# Patient Record
Sex: Male | Born: 1985 | Race: Black or African American | Hispanic: No | Marital: Single | Smoking: Former smoker
Health system: Southern US, Community
[De-identification: ages and names within clinical notes are randomized; demographics above are authoritative.]

## PROBLEM LIST (undated history)

## (undated) DIAGNOSIS — I10 Essential (primary) hypertension: Secondary | ICD-10-CM

## (undated) DIAGNOSIS — M109 Gout, unspecified: Secondary | ICD-10-CM

## (undated) DIAGNOSIS — I509 Heart failure, unspecified: Secondary | ICD-10-CM

## (undated) DIAGNOSIS — N179 Acute kidney failure, unspecified: Secondary | ICD-10-CM

## (undated) DIAGNOSIS — Z789 Other specified health status: Secondary | ICD-10-CM

---
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
## 2005-10-18 ENCOUNTER — Emergency Department (HOSPITAL_COMMUNITY): Admission: EM | Admit: 2005-10-18 | Discharge: 2005-10-18 | Payer: Self-pay | Admitting: Emergency Medicine

## 2006-07-11 ENCOUNTER — Emergency Department (HOSPITAL_COMMUNITY): Admission: EM | Admit: 2006-07-11 | Discharge: 2006-07-11 | Payer: Self-pay | Admitting: Emergency Medicine

## 2006-11-24 ENCOUNTER — Emergency Department (HOSPITAL_COMMUNITY): Admission: EM | Admit: 2006-11-24 | Discharge: 2006-11-25 | Payer: Self-pay | Admitting: Emergency Medicine

## 2007-05-20 ENCOUNTER — Emergency Department (HOSPITAL_COMMUNITY): Admission: EM | Admit: 2007-05-20 | Discharge: 2007-05-20 | Payer: Self-pay | Admitting: Family Medicine

## 2007-08-09 ENCOUNTER — Emergency Department (HOSPITAL_COMMUNITY): Admission: EM | Admit: 2007-08-09 | Discharge: 2007-08-10 | Payer: Self-pay | Admitting: Emergency Medicine

## 2010-02-17 ENCOUNTER — Emergency Department (HOSPITAL_COMMUNITY)
Admission: EM | Admit: 2010-02-17 | Discharge: 2010-02-17 | Disposition: A | Discharge status: Home / Self Care | Payer: Self-pay | Attending: Emergency Medicine | Admitting: Emergency Medicine

## 2010-02-17 DIAGNOSIS — K029 Dental caries, unspecified: Secondary | ICD-10-CM | POA: Insufficient documentation

## 2010-02-17 DIAGNOSIS — K047 Periapical abscess without sinus: Secondary | ICD-10-CM | POA: Insufficient documentation

## 2010-02-17 DIAGNOSIS — K089 Disorder of teeth and supporting structures, unspecified: Secondary | ICD-10-CM | POA: Insufficient documentation

## 2010-10-19 ENCOUNTER — Emergency Department (HOSPITAL_COMMUNITY)
Admission: EM | Admit: 2010-10-19 | Discharge: 2010-10-20 | Disposition: A | Discharge status: Home / Self Care | Payer: Self-pay | Attending: Emergency Medicine | Admitting: Emergency Medicine

## 2010-10-19 ENCOUNTER — Emergency Department (HOSPITAL_COMMUNITY): Payer: Self-pay

## 2010-10-19 DIAGNOSIS — R05 Cough: Secondary | ICD-10-CM | POA: Insufficient documentation

## 2010-10-19 DIAGNOSIS — R079 Chest pain, unspecified: Secondary | ICD-10-CM | POA: Insufficient documentation

## 2010-10-19 DIAGNOSIS — R059 Cough, unspecified: Secondary | ICD-10-CM | POA: Insufficient documentation

## 2010-10-19 DIAGNOSIS — R509 Fever, unspecified: Secondary | ICD-10-CM | POA: Insufficient documentation

## 2010-10-19 DIAGNOSIS — R0602 Shortness of breath: Secondary | ICD-10-CM | POA: Insufficient documentation

## 2010-10-19 DIAGNOSIS — J069 Acute upper respiratory infection, unspecified: Secondary | ICD-10-CM | POA: Insufficient documentation

## 2012-03-25 ENCOUNTER — Emergency Department (HOSPITAL_COMMUNITY)
Admission: EM | Admit: 2012-03-25 | Discharge: 2012-03-25 | Discharge status: Against Medical Advice | Payer: Self-pay | Attending: Emergency Medicine | Admitting: Emergency Medicine

## 2012-03-25 ENCOUNTER — Encounter (HOSPITAL_COMMUNITY): Payer: Self-pay | Admitting: *Deleted

## 2012-03-25 ENCOUNTER — Emergency Department (HOSPITAL_COMMUNITY): Payer: Self-pay

## 2012-03-25 DIAGNOSIS — F17200 Nicotine dependence, unspecified, uncomplicated: Secondary | ICD-10-CM | POA: Insufficient documentation

## 2012-03-25 DIAGNOSIS — R05 Cough: Secondary | ICD-10-CM | POA: Insufficient documentation

## 2012-03-25 DIAGNOSIS — R059 Cough, unspecified: Secondary | ICD-10-CM | POA: Insufficient documentation

## 2012-03-25 DIAGNOSIS — R509 Fever, unspecified: Secondary | ICD-10-CM | POA: Insufficient documentation

## 2012-03-25 MED ORDER — ACETAMINOPHEN 325 MG PO TABS
650.0000 mg | ORAL_TABLET | Freq: Once | ORAL | Status: AC
  Administered 2012-03-25: 650 mg via ORAL
  Filled 2012-03-25: qty 2

## 2012-03-25 NOTE — ED Notes (Signed)
Pt c/o fever, cough, chills x 2 days

## 2012-03-25 NOTE — ED Notes (Signed)
Pt not in waiting area x 3 

## 2012-04-09 ENCOUNTER — Emergency Department (HOSPITAL_COMMUNITY): Payer: Self-pay

## 2012-04-09 ENCOUNTER — Observation Stay (HOSPITAL_COMMUNITY)
Admission: EM | Admit: 2012-04-09 | Discharge: 2012-04-11 | Payer: Self-pay | Attending: General Surgery | Admitting: General Surgery

## 2012-04-09 DIAGNOSIS — Z1811 Retained magnetic metal fragments: Secondary | ICD-10-CM | POA: Insufficient documentation

## 2012-04-09 DIAGNOSIS — S31809A Unspecified open wound of unspecified buttock, initial encounter: Principal | ICD-10-CM | POA: Insufficient documentation

## 2012-04-09 DIAGNOSIS — S31813A Puncture wound without foreign body of right buttock, initial encounter: Secondary | ICD-10-CM

## 2012-04-09 DIAGNOSIS — D62 Acute posthemorrhagic anemia: Secondary | ICD-10-CM | POA: Insufficient documentation

## 2012-04-09 HISTORY — DX: Other specified health status: Z78.9

## 2012-04-09 LAB — URINALYSIS, ROUTINE W REFLEX MICROSCOPIC
Bilirubin Urine: NEGATIVE
Hgb urine dipstick: NEGATIVE
Specific Gravity, Urine: 1.036 — ABNORMAL HIGH (ref 1.005–1.030)
pH: 6 (ref 5.0–8.0)

## 2012-04-09 LAB — CBC WITH DIFFERENTIAL/PLATELET
Eosinophils Absolute: 0.1 10*3/uL (ref 0.0–0.7)
HCT: 39.7 % (ref 39.0–52.0)
Hemoglobin: 13.9 g/dL (ref 13.0–17.0)
Lymphs Abs: 3.2 10*3/uL (ref 0.7–4.0)
MCH: 29.4 pg (ref 26.0–34.0)
Monocytes Relative: 4 % (ref 3–12)
Neutrophils Relative %: 71 % (ref 43–77)
RBC: 4.72 MIL/uL (ref 4.22–5.81)

## 2012-04-09 LAB — POCT I-STAT, CHEM 8
Glucose, Bld: 123 mg/dL — ABNORMAL HIGH (ref 70–99)
HCT: 45 % (ref 39.0–52.0)
Hemoglobin: 15.3 g/dL (ref 13.0–17.0)
Potassium: 3.3 mEq/L — ABNORMAL LOW (ref 3.5–5.1)
Sodium: 142 mEq/L (ref 135–145)
TCO2: 21 mmol/L (ref 0–100)

## 2012-04-09 LAB — BASIC METABOLIC PANEL
BUN: 9 mg/dL (ref 6–23)
Chloride: 102 mEq/L (ref 96–112)
GFR calc non Af Amer: 70 mL/min — ABNORMAL LOW (ref >90)
Glucose, Bld: 123 mg/dL — ABNORMAL HIGH (ref 70–99)
Potassium: 3.3 mEq/L — ABNORMAL LOW (ref 3.5–5.1)

## 2012-04-09 LAB — URINE MICROSCOPIC-ADD ON

## 2012-04-09 MED ORDER — HYDROMORPHONE HCL PF 1 MG/ML IJ SOLN
1.0000 mg | INTRAMUSCULAR | Status: DC | PRN
  Administered 2012-04-09 – 2012-04-11 (×6): 1 mg via INTRAVENOUS
  Filled 2012-04-09 (×7): qty 1

## 2012-04-09 MED ORDER — OXYCODONE HCL 5 MG PO TABS: 5.0000 mg | ORAL_TABLET | ORAL | Status: DC | PRN

## 2012-04-09 MED ORDER — HYDROMORPHONE HCL PF 1 MG/ML IJ SOLN
INTRAMUSCULAR | Status: AC
  Administered 2012-04-09: 1 mg via INTRAVENOUS
  Filled 2012-04-09: qty 1

## 2012-04-09 MED ORDER — ONDANSETRON HCL 4 MG PO TABS: 4.0000 mg | ORAL_TABLET | Freq: Four times a day (QID) | ORAL | Status: DC | PRN

## 2012-04-09 MED ORDER — POTASSIUM CHLORIDE IN NACL 20-0.9 MEQ/L-% IV SOLN
INTRAVENOUS | Status: DC
  Administered 2012-04-09 – 2012-04-10 (×4): via INTRAVENOUS
  Filled 2012-04-09 (×4): qty 1000

## 2012-04-09 MED ORDER — HYDROMORPHONE HCL PF 1 MG/ML IJ SOLN
1.0000 mg | Freq: Once | INTRAMUSCULAR | Status: DC
  Administered 2012-04-09: 1 mg via INTRAVENOUS

## 2012-04-09 MED ORDER — TETANUS-DIPHTH-ACELL PERTUSSIS 5-2.5-18.5 LF-MCG/0.5 IM SUSP
INTRAMUSCULAR | Status: AC
  Filled 2012-04-09: qty 0.5

## 2012-04-09 MED ORDER — TETANUS-DIPHTH-ACELL PERTUSSIS 5-2.5-18.5 LF-MCG/0.5 IM SUSP
0.5000 mL | Freq: Once | INTRAMUSCULAR | Status: DC
  Administered 2012-04-09: 0.5 mL via INTRAMUSCULAR

## 2012-04-09 MED ORDER — CEFAZOLIN SODIUM-DEXTROSE 2-3 GM-% IV SOLR
2.0000 g | Freq: Three times a day (TID) | INTRAVENOUS | Status: DC
  Administered 2012-04-09 – 2012-04-11 (×6): 2 g via INTRAVENOUS
  Filled 2012-04-09 (×8): qty 50

## 2012-04-09 MED ORDER — OXYCODONE HCL 5 MG PO TABS: 2.5000 mg | ORAL_TABLET | ORAL | Status: DC | PRN

## 2012-04-09 MED ORDER — OXYCODONE-ACETAMINOPHEN 5-325 MG PO TABS: 2.0000 | ORAL_TABLET | ORAL | Status: DC | PRN

## 2012-04-09 MED ORDER — IOHEXOL 300 MG/ML  SOLN
100.0000 mL | Freq: Once | INTRAMUSCULAR | Status: DC | PRN
  Administered 2012-04-09: 100 mL via INTRAVENOUS

## 2012-04-09 MED ORDER — ONDANSETRON HCL 4 MG/2ML IJ SOLN: 4.0000 mg | Freq: Four times a day (QID) | INTRAMUSCULAR | Status: DC | PRN

## 2012-04-09 MED ORDER — OXYCODONE HCL 5 MG PO TABS: 10.0000 mg | ORAL_TABLET | ORAL | Status: DC | PRN
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
## 2012-04-09 NOTE — ED Notes (Signed)
No chnages, VSS, pt to CT. Alert, NAD, calm, interactive, lying on L side.

## 2012-04-09 NOTE — ED Notes (Addendum)
To CT, no changes. blooody gauze saturated. new folded abd pad applied to buttocks with hypafix tape.
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
## 2012-04-09 NOTE — ED Notes (Signed)
Pt talking on phone

## 2012-04-09 NOTE — ED Notes (Signed)
Dr. Norlene Campbell at [facility], updating pt re: results.
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
## 2012-04-09 NOTE — ED Notes (Signed)
H&H noted. VSS, pt talking on cell phone. Pending lab results.

## 2012-04-10 LAB — BASIC METABOLIC PANEL
BUN: 6 mg/dL (ref 6–23)
Calcium: 8.6 mg/dL (ref 8.4–10.5)
GFR calc Af Amer: >90 mL/min (ref >90)
GFR calc non Af Amer: 87 mL/min — ABNORMAL LOW (ref >90)
Glucose, Bld: 94 mg/dL (ref 70–99)
Potassium: 3.8 mEq/L (ref 3.5–5.1)

## 2012-04-10 LAB — CBC
HCT: 34.2 % — ABNORMAL LOW (ref 39.0–52.0)
MCH: 29.5 pg (ref 26.0–34.0)
MCHC: 35.1 g/dL (ref 30.0–36.0)
RDW: 12.8 % (ref 11.5–15.5)
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
## 2012-04-10 NOTE — Progress Notes (Signed)
Utilization review completed.  
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
## 2012-04-11 DIAGNOSIS — W3400XA Accidental discharge from unspecified firearms or gun, initial encounter: Secondary | ICD-10-CM

## 2012-04-11 DIAGNOSIS — S31803A Puncture wound without foreign body of unspecified buttock, initial encounter: Secondary | ICD-10-CM

## 2012-04-11 DIAGNOSIS — D62 Acute posthemorrhagic anemia: Secondary | ICD-10-CM | POA: Diagnosis not present

## 2012-04-11 MED ORDER — OXYCODONE-ACETAMINOPHEN 5-325 MG PO TABS: 2.0000 | ORAL_TABLET | ORAL | Status: DC | PRN

## 2012-04-11 MED ORDER — DIPHENHYDRAMINE HCL 25 MG PO CAPS
25.0000 mg | ORAL_CAPSULE | Freq: Once | ORAL | Status: DC
  Administered 2012-04-11: 25 mg via ORAL
  Filled 2012-04-11: qty 1
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
## 2012-04-11 NOTE — Discharge Summary (Signed)
Raheel Kunkle, MD, MPH, FACS Pager: [phone]  

## 2012-04-11 NOTE — Progress Notes (Signed)
Patient examined and I agree with the assessment and plan  Violeta Gelinas, MD, MPH, FACS Pager: [phone]  04/11/2012 4:31 PM

## 2012-04-11 NOTE — Progress Notes (Signed)
Before d/c, Robert Daniels c/o itching and rash on lower legs and R hip. Robert Daniels has never taken dilaudid before. No respiratory involvement is apparent  MD notified. Dr Elza Rafter ordered benedryl and advised to d/c patient once benedryl is given.
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
## 2019-04-18 ENCOUNTER — Other Ambulatory Visit: Payer: Self-pay

## 2019-04-18 ENCOUNTER — Encounter (HOSPITAL_BASED_OUTPATIENT_CLINIC_OR_DEPARTMENT_OTHER): Payer: Self-pay | Admitting: *Deleted

## 2019-04-18 ENCOUNTER — Emergency Department (HOSPITAL_BASED_OUTPATIENT_CLINIC_OR_DEPARTMENT_OTHER)
Admission: EM | Admit: 2019-04-18 | Discharge: 2019-04-18 | Disposition: A | Discharge status: Home / Self Care | Payer: Self-pay | Attending: Emergency Medicine | Admitting: Emergency Medicine

## 2019-04-18 DIAGNOSIS — Z20822 Contact with and (suspected) exposure to covid-19: Secondary | ICD-10-CM | POA: Insufficient documentation

## 2019-04-18 DIAGNOSIS — J029 Acute pharyngitis, unspecified: Secondary | ICD-10-CM | POA: Insufficient documentation

## 2019-04-18 DIAGNOSIS — F17203 Nicotine dependence unspecified, with withdrawal: Secondary | ICD-10-CM | POA: Insufficient documentation

## 2019-04-18 LAB — GROUP A STREP BY PCR: Group A Strep by PCR: NOT DETECTED

## 2019-04-18 LAB — MONONUCLEOSIS SCREEN: Mono Screen: NEGATIVE

## 2019-04-18 MED ORDER — IBUPROFEN 800 MG PO TABS
800.0000 mg | ORAL_TABLET | Freq: Once | ORAL | Status: AC
  Administered 2019-04-18: 21:00:00 800 mg via ORAL
  Filled 2019-04-18: qty 1

## 2019-04-18 MED ORDER — ACETAMINOPHEN 500 MG PO TABS
1000.0000 mg | ORAL_TABLET | Freq: Once | ORAL | Status: AC
  Administered 2019-04-18: 1000 mg via ORAL
  Filled 2019-04-18: qty 2

## 2019-04-18 MED ORDER — DEXAMETHASONE SODIUM PHOSPHATE 10 MG/ML IJ SOLN
10.0000 mg | Freq: Once | INTRAMUSCULAR | Status: AC
  Administered 2019-04-18: 21:00:00 10 mg via INTRAMUSCULAR
  Filled 2019-04-18: qty 1

## 2019-04-18 MED ORDER — AMOXICILLIN 500 MG PO CAPS: 500.0000 mg | ORAL_CAPSULE | Freq: Two times a day (BID) | ORAL | 0 refills | Status: AC

## 2019-04-18 NOTE — ED Triage Notes (Signed)
Chills, sore throat, fatigue, sob, headache, body aches.

## 2019-04-18 NOTE — Discharge Instructions (Signed)
You were seen in the emergency department today with sore throat and fever.  I am starting you on antibiotic but your strep test was negative.  I am testing you for COVID-19 and you can follow the test results in the MyChart app.  If your Covid test comes back positive you can stop the antibiotic.  If you develop worsening sore throat, inability to swallow or difficulty breathing you should return to emergency department immediately.  Please take the entire course of antibiotic.  I have also given you a steroid which will help with swelling and discomfort.
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
## 2019-04-19 LAB — SARS CORONAVIRUS 2 (TAT 6-24 HRS): SARS Coronavirus 2: NEGATIVE

## 2020-03-17 ENCOUNTER — Emergency Department (HOSPITAL_BASED_OUTPATIENT_CLINIC_OR_DEPARTMENT_OTHER)
Admission: EM | Admit: 2020-03-17 | Discharge: 2020-03-17 | Disposition: A | Discharge status: Home / Self Care | Payer: Self-pay | Attending: Emergency Medicine | Admitting: Emergency Medicine

## 2020-03-17 ENCOUNTER — Encounter (HOSPITAL_BASED_OUTPATIENT_CLINIC_OR_DEPARTMENT_OTHER): Payer: Self-pay

## 2020-03-17 ENCOUNTER — Other Ambulatory Visit: Payer: Self-pay

## 2020-03-17 ENCOUNTER — Emergency Department (HOSPITAL_BASED_OUTPATIENT_CLINIC_OR_DEPARTMENT_OTHER): Payer: Self-pay

## 2020-03-17 DIAGNOSIS — R0781 Pleurodynia: Secondary | ICD-10-CM | POA: Insufficient documentation

## 2020-03-17 DIAGNOSIS — R0789 Other chest pain: Secondary | ICD-10-CM

## 2020-03-17 DIAGNOSIS — F17290 Nicotine dependence, other tobacco product, uncomplicated: Secondary | ICD-10-CM | POA: Insufficient documentation

## 2020-03-17 DIAGNOSIS — R059 Cough, unspecified: Secondary | ICD-10-CM | POA: Insufficient documentation

## 2020-03-17 MED ORDER — IBUPROFEN 600 MG PO TABS: 600.0000 mg | ORAL_TABLET | Freq: Four times a day (QID) | ORAL | 0 refills | Status: DC | PRN

## 2020-03-17 MED ORDER — CYCLOBENZAPRINE HCL 10 MG PO TABS: 10.0000 mg | ORAL_TABLET | Freq: Three times a day (TID) | ORAL | 0 refills | Status: DC | PRN

## 2020-03-17 NOTE — ED Notes (Signed)
AVS provided to patient, pt teaching done in regards to the two Rx electronically sent to his pharmacy. Discussed safety while taking muscle relaxants. Opportunity for questions provided. Pt was able to ambulate to ck out without assistance.

## 2020-03-17 NOTE — ED Triage Notes (Signed)
Pt complains of pain under left ribs when coughing or moving a certain way. States pain is sharp in nature. Been occurring x1 week. States has been coughing and "had a cold" for the past week.

## 2020-03-17 NOTE — Discharge Instructions (Signed)
Take motrin for pain   Take flexeril for muscle spasms   See your doctor for follow up   Return to ER if you have worse chest wall pain, trouble breathing, cough, fever
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
## 2020-04-17 ENCOUNTER — Emergency Department (HOSPITAL_BASED_OUTPATIENT_CLINIC_OR_DEPARTMENT_OTHER)
Admission: EM | Admit: 2020-04-17 | Discharge: 2020-04-18 | Disposition: A | Discharge status: Home / Self Care | Payer: Self-pay | Attending: Emergency Medicine | Admitting: Emergency Medicine

## 2020-04-17 ENCOUNTER — Encounter (HOSPITAL_BASED_OUTPATIENT_CLINIC_OR_DEPARTMENT_OTHER): Payer: Self-pay | Admitting: *Deleted

## 2020-04-17 ENCOUNTER — Other Ambulatory Visit: Payer: Self-pay

## 2020-04-17 DIAGNOSIS — R000 Tachycardia, unspecified: Secondary | ICD-10-CM | POA: Insufficient documentation

## 2020-04-17 DIAGNOSIS — Z28310 Unvaccinated for covid-19: Secondary | ICD-10-CM | POA: Insufficient documentation

## 2020-04-17 DIAGNOSIS — Z20822 Contact with and (suspected) exposure to covid-19: Secondary | ICD-10-CM | POA: Insufficient documentation

## 2020-04-17 DIAGNOSIS — B349 Viral infection, unspecified: Secondary | ICD-10-CM | POA: Insufficient documentation

## 2020-04-17 DIAGNOSIS — F17290 Nicotine dependence, other tobacco product, uncomplicated: Secondary | ICD-10-CM | POA: Insufficient documentation

## 2020-04-17 MED ORDER — ACETAMINOPHEN 325 MG PO TABS
ORAL_TABLET | ORAL | Status: AC
  Filled 2020-04-17: qty 2

## 2020-04-17 MED ORDER — ACETAMINOPHEN 325 MG PO TABS
650.0000 mg | ORAL_TABLET | Freq: Once | ORAL | Status: AC
  Administered 2020-04-17: 650 mg via ORAL

## 2020-04-17 NOTE — ED Provider Notes (Signed)
[facility] EMERGENCY DEPARTMENT Provider Note  CSN: [id number] Arrival date & time: 04/17/20 2225    History Chief Complaint  Patient presents with  . Sore Throat  . Fever    HPI  Robert Daniels is a 35 y.o. male presents for evaluation of cough. He reports he has been sick for about a month and half with cough, worse at night, initially had some L rib pain which prompted him to come to the ED where a CXR was negative. He has continued to cough since then although the rib pain is resolved. He reports he has been coughing up green sputum. In the last few days he has started to have some sore throat and today he began running a fever. He has not had a Covid vaccine. No known sick contacts.    History reviewed. No pertinent past medical history.  History reviewed. No pertinent surgical history.  No family history on file.  Social History   Tobacco Use  . Smoking status: Current Every Day Smoker    Types: Cigars  . Smokeless tobacco: Never Used  Substance Use Topics  . Alcohol use: Yes  . Drug use: Never     Home Medications Prior to Admission medications   Medication Sig Start Date End Date Taking? Authorizing Provider  cyclobenzaprine (FLEXERIL) 10 MG tablet Take 1 tablet (10 mg total) by mouth 3 (three) times daily as needed for muscle spasms. 03/17/20   Charlynne Pander, MD  ibuprofen (ADVIL) 600 MG tablet Take 1 tablet (600 mg total) by mouth every 6 (six) hours as needed. 03/17/20   Charlynne Pander, MD     Allergies    Patient has no known allergies.   Review of Systems   Review of Systems A comprehensive review of systems was completed and negative except as noted in HPI.    Physical Exam BP (!) 128/91 (BP Location: Left Arm)   Pulse (!) 114   Temp 100 F (37.8 C) (Oral)   Resp 19   Ht 6' (1.829 m)   Wt 136.1 kg   SpO2 97%   BMI 40.69 kg/m   Physical Exam Vitals and nursing note reviewed.  Constitutional:      Appearance: Normal  appearance.  HENT:     Head: Normocephalic and atraumatic.     Nose: Nose normal.     Mouth/Throat:     Mouth: Mucous membranes are moist.     Pharynx: Pharyngeal swelling and posterior oropharyngeal erythema present. No oropharyngeal exudate.     Tonsils: No tonsillar exudate.  Eyes:     Extraocular Movements: Extraocular movements intact.     Conjunctiva/sclera: Conjunctivae normal.  Cardiovascular:     Rate and Rhythm: Normal rate.  Pulmonary:     Effort: Pulmonary effort is normal.     Breath sounds: Normal breath sounds.  Abdominal:     General: Abdomen is flat.     Palpations: Abdomen is soft.     Tenderness: There is no abdominal tenderness.  Musculoskeletal:        General: No swelling. Normal range of motion.     Cervical back: Neck supple.  Lymphadenopathy:     Cervical: No cervical adenopathy.  Skin:    General: Skin is warm and dry.  Neurological:     General: No focal deficit present.     Mental Status: He is alert.  Psychiatric:        Mood and Affect: Mood normal.  ED Results / Procedures / Treatments   Labs (all labs ordered are listed, but only abnormal results are displayed) Labs Reviewed  GROUP A STREP BY PCR  SARS CORONAVIRUS 2 (TAT 6-24 HRS)    EKG None   Radiology DG Chest 2 View  Result Date: 04/18/2020 CLINICAL DATA:  Cough, fever EXAM: CHEST - 2 VIEW COMPARISON:  03/17/2020 FINDINGS: Heart and mediastinal contours are within normal limits. No focal opacities or effusions. No acute bony abnormality. IMPRESSION: No active cardiopulmonary disease. Electronically Signed   By: Charlett Nose M.D.   On: 04/18/2020 00:16    Procedures Procedures  Medications Ordered in the ED Medications  acetaminophen (TYLENOL) tablet 650 mg (650 mg Oral Given 04/17/20 2236)     MDM Rules/Calculators/A&P MDM Patient with continued cough, productive of green sputum here with fever and tachycardia on arrival. Less tachycardic now after APAP given in  triage. Will check for Covid/Flu and Strep. CXR for pneumonia.  ED Course  I have reviewed the triage vital signs and the nursing notes.  Pertinent labs & imaging results that were available during my care of the patient were reviewed by me and considered in my medical decision making (see chart for details).  Clinical Course as of 04/18/20 0131  Thu Apr 18, 2020  0129 Strep and CXR are both neg. Covid/Flu is pending. Recommend supportive care at home, Motrin/APAP for fever, body aches and sore throat. Drink plenty of fluids. Quarantine instructions given until Covid results. RTED for any other concerns. Otherwise can follow up with PCP.  [CS]    Clinical Course User Index [CS] Pollyann Savoy, MD    Final Clinical Impression(s) / ED Diagnoses Final diagnoses:  Viral syndrome    Rx / DC Orders ED Discharge Orders    None       Pollyann Savoy, MD 04/18/20 0131

## 2020-04-17 NOTE — ED Triage Notes (Addendum)
C/o cough and sore throat fever x 1 week

## 2020-04-18 ENCOUNTER — Emergency Department (HOSPITAL_BASED_OUTPATIENT_CLINIC_OR_DEPARTMENT_OTHER): Payer: Self-pay

## 2020-04-18 LAB — SARS CORONAVIRUS 2 (TAT 6-24 HRS): SARS Coronavirus 2: NEGATIVE

## 2020-04-18 LAB — GROUP A STREP BY PCR: Group A Strep by PCR: NOT DETECTED

## 2020-12-18 ENCOUNTER — Other Ambulatory Visit: Payer: Self-pay

## 2020-12-18 ENCOUNTER — Emergency Department (HOSPITAL_BASED_OUTPATIENT_CLINIC_OR_DEPARTMENT_OTHER): Payer: Self-pay

## 2020-12-18 ENCOUNTER — Emergency Department (HOSPITAL_BASED_OUTPATIENT_CLINIC_OR_DEPARTMENT_OTHER)
Admission: EM | Admit: 2020-12-18 | Discharge: 2020-12-19 | Disposition: A | Discharge status: Home / Self Care | Payer: Self-pay | Attending: Emergency Medicine | Admitting: Emergency Medicine

## 2020-12-18 ENCOUNTER — Encounter (HOSPITAL_BASED_OUTPATIENT_CLINIC_OR_DEPARTMENT_OTHER): Payer: Self-pay

## 2020-12-18 DIAGNOSIS — J181 Lobar pneumonia, unspecified organism: Secondary | ICD-10-CM | POA: Insufficient documentation

## 2020-12-18 DIAGNOSIS — J189 Pneumonia, unspecified organism: Secondary | ICD-10-CM

## 2020-12-18 DIAGNOSIS — Z20822 Contact with and (suspected) exposure to covid-19: Secondary | ICD-10-CM | POA: Insufficient documentation

## 2020-12-18 DIAGNOSIS — F17210 Nicotine dependence, cigarettes, uncomplicated: Secondary | ICD-10-CM | POA: Insufficient documentation

## 2020-12-18 LAB — RESP PANEL BY RT-PCR (FLU A&B, COVID) ARPGX2
Influenza A by PCR: NEGATIVE
Influenza B by PCR: NEGATIVE
SARS Coronavirus 2 by RT PCR: NEGATIVE

## 2020-12-18 MED ORDER — ACETAMINOPHEN 325 MG PO TABS
650.0000 mg | ORAL_TABLET | Freq: Once | ORAL | Status: AC
  Administered 2020-12-18: 650 mg via ORAL
  Filled 2020-12-18: qty 2
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
## 2020-12-18 NOTE — ED Triage Notes (Signed)
Pt c/o flu like sx x 2 weeks with +flu exposure-blood in sputum today-NAD-steady gait

## 2020-12-19 LAB — CBC WITH DIFFERENTIAL/PLATELET
Abs Immature Granulocytes: 0.04 10*3/uL (ref 0.00–0.07)
Basophils Absolute: 0.1 10*3/uL (ref 0.0–0.1)
Basophils Relative: 0 %
Eosinophils Absolute: 0.1 10*3/uL (ref 0.0–0.5)
Eosinophils Relative: 1 %
HCT: 42.4 % (ref 39.0–52.0)
Hemoglobin: 14.5 g/dL (ref 13.0–17.0)
Immature Granulocytes: 0 %
Lymphocytes Relative: 15 %
Lymphs Abs: 2 10*3/uL (ref 0.7–4.0)
MCH: 29.2 pg (ref 26.0–34.0)
MCHC: 34.2 g/dL (ref 30.0–36.0)
MCV: 85.5 fL (ref 80.0–100.0)
Monocytes Absolute: 0.7 10*3/uL (ref 0.1–1.0)
Monocytes Relative: 5 %
Neutro Abs: 10.7 10*3/uL — ABNORMAL HIGH (ref 1.7–7.7)
Neutrophils Relative %: 79 %
Platelets: 245 10*3/uL (ref 150–400)
RBC: 4.96 MIL/uL (ref 4.22–5.81)
RDW: 13.5 % (ref 11.5–15.5)
WBC: 13.6 10*3/uL — ABNORMAL HIGH (ref 4.0–10.5)
nRBC: 0 % (ref 0.0–0.2)

## 2020-12-19 LAB — BASIC METABOLIC PANEL
Anion gap: 9 (ref 5–15)
BUN: 11 mg/dL (ref 6–20)
CO2: 23 mmol/L (ref 22–32)
Calcium: 9 mg/dL (ref 8.9–10.3)
Chloride: 105 mmol/L (ref 98–111)
Creatinine, Ser: 1.4 mg/dL — ABNORMAL HIGH (ref 0.61–1.24)
GFR, Estimated: >60 mL/min (ref >60)
Glucose, Bld: 105 mg/dL — ABNORMAL HIGH (ref 70–99)
Potassium: 3.9 mmol/L (ref 3.5–5.1)
Sodium: 137 mmol/L (ref 135–145)

## 2020-12-19 LAB — D-DIMER, QUANTITATIVE: D-Dimer, Quant: <0.27 ug/mL-FEU (ref 0.00–0.50)

## 2020-12-19 MED ORDER — AZITHROMYCIN 250 MG PO TABS
500.0000 mg | ORAL_TABLET | Freq: Once | ORAL | Status: AC
  Administered 2020-12-19: 500 mg via ORAL
  Filled 2020-12-19: qty 2

## 2020-12-19 MED ORDER — BENZONATATE 100 MG PO CAPS: 100.0000 mg | ORAL_CAPSULE | Freq: Three times a day (TID) | ORAL | 0 refills | Status: DC | PRN

## 2020-12-19 MED ORDER — AZITHROMYCIN 250 MG PO TABS: 250.0000 mg | ORAL_TABLET | Freq: Every day | ORAL | 0 refills | Status: DC

## 2020-12-19 MED ORDER — AMOXICILLIN-POT CLAVULANATE 875-125 MG PO TABS: 1.0000 | ORAL_TABLET | Freq: Two times a day (BID) | ORAL | 0 refills | Status: DC

## 2020-12-19 MED ORDER — AMOXICILLIN-POT CLAVULANATE 875-125 MG PO TABS
1.0000 | ORAL_TABLET | Freq: Once | ORAL | Status: AC
  Administered 2020-12-19: 1 via ORAL
  Filled 2020-12-19: qty 1

## 2021-03-19 ENCOUNTER — Inpatient Hospital Stay (HOSPITAL_COMMUNITY): Payer: Self-pay

## 2021-03-19 ENCOUNTER — Emergency Department (HOSPITAL_BASED_OUTPATIENT_CLINIC_OR_DEPARTMENT_OTHER): Payer: Self-pay

## 2021-03-19 ENCOUNTER — Encounter (HOSPITAL_BASED_OUTPATIENT_CLINIC_OR_DEPARTMENT_OTHER): Payer: Self-pay | Admitting: Emergency Medicine

## 2021-03-19 ENCOUNTER — Other Ambulatory Visit: Payer: Self-pay

## 2021-03-19 ENCOUNTER — Inpatient Hospital Stay (HOSPITAL_BASED_OUTPATIENT_CLINIC_OR_DEPARTMENT_OTHER)
Admission: EM | Admit: 2021-03-19 | Discharge: 2021-03-22 | DRG: 286 | Disposition: A | Discharge status: Home / Self Care | Payer: Self-pay | Attending: Internal Medicine | Admitting: Internal Medicine

## 2021-03-19 DIAGNOSIS — I514 Myocarditis, unspecified: Principal | ICD-10-CM | POA: Clinically undetermined

## 2021-03-19 DIAGNOSIS — Z8701 Personal history of pneumonia (recurrent): Secondary | ICD-10-CM

## 2021-03-19 DIAGNOSIS — I130 Hypertensive heart and chronic kidney disease with heart failure and stage 1 through stage 4 chronic kidney disease, or unspecified chronic kidney disease: Secondary | ICD-10-CM | POA: Diagnosis present

## 2021-03-19 DIAGNOSIS — I10 Essential (primary) hypertension: Secondary | ICD-10-CM

## 2021-03-19 DIAGNOSIS — F17210 Nicotine dependence, cigarettes, uncomplicated: Secondary | ICD-10-CM | POA: Diagnosis present

## 2021-03-19 DIAGNOSIS — Z20822 Contact with and (suspected) exposure to covid-19: Secondary | ICD-10-CM | POA: Diagnosis present

## 2021-03-19 DIAGNOSIS — Z6838 Body mass index (BMI) 38.0-38.9, adult: Secondary | ICD-10-CM

## 2021-03-19 DIAGNOSIS — R7989 Other specified abnormal findings of blood chemistry: Secondary | ICD-10-CM | POA: Diagnosis present

## 2021-03-19 DIAGNOSIS — F17290 Nicotine dependence, other tobacco product, uncomplicated: Secondary | ICD-10-CM | POA: Diagnosis present

## 2021-03-19 DIAGNOSIS — E876 Hypokalemia: Secondary | ICD-10-CM | POA: Diagnosis not present

## 2021-03-19 DIAGNOSIS — E66812 Obesity, class 2: Secondary | ICD-10-CM

## 2021-03-19 DIAGNOSIS — Z6841 Body Mass Index (BMI) 40.0 and over, adult: Secondary | ICD-10-CM

## 2021-03-19 DIAGNOSIS — R079 Chest pain, unspecified: Secondary | ICD-10-CM

## 2021-03-19 DIAGNOSIS — N182 Chronic kidney disease, stage 2 (mild): Secondary | ICD-10-CM | POA: Diagnosis present

## 2021-03-19 DIAGNOSIS — I5021 Acute systolic (congestive) heart failure: Secondary | ICD-10-CM

## 2021-03-19 DIAGNOSIS — I509 Heart failure, unspecified: Secondary | ICD-10-CM

## 2021-03-19 DIAGNOSIS — I214 Non-ST elevation (NSTEMI) myocardial infarction: Principal | ICD-10-CM

## 2021-03-19 DIAGNOSIS — I309 Acute pericarditis, unspecified: Principal | ICD-10-CM | POA: Diagnosis present

## 2021-03-19 DIAGNOSIS — I428 Other cardiomyopathies: Secondary | ICD-10-CM | POA: Diagnosis present

## 2021-03-19 DIAGNOSIS — R0602 Shortness of breath: Secondary | ICD-10-CM

## 2021-03-19 DIAGNOSIS — R778 Other specified abnormalities of plasma proteins: Secondary | ICD-10-CM | POA: Diagnosis present

## 2021-03-19 DIAGNOSIS — N179 Acute kidney failure, unspecified: Secondary | ICD-10-CM

## 2021-03-19 LAB — RAPID URINE DRUG SCREEN, HOSP PERFORMED
Amphetamines: NOT DETECTED
Barbiturates: NOT DETECTED
Benzodiazepines: NOT DETECTED
Cocaine: NOT DETECTED
Opiates: NOT DETECTED
Tetrahydrocannabinol: NOT DETECTED

## 2021-03-19 LAB — ECHOCARDIOGRAM COMPLETE
Calc EF: 26.5 %
Height: 72 in
Radius: 0.3 cm
S' Lateral: 6.1 cm
Single Plane A2C EF: 27.7 %
Single Plane A4C EF: 26.9 %
Weight: 4640 oz

## 2021-03-19 LAB — URINALYSIS, ROUTINE W REFLEX MICROSCOPIC
Bilirubin Urine: NEGATIVE
Glucose, UA: NEGATIVE mg/dL
Hgb urine dipstick: NEGATIVE
Ketones, ur: NEGATIVE mg/dL
Leukocytes,Ua: NEGATIVE
Nitrite: NEGATIVE
Protein, ur: NEGATIVE mg/dL
Specific Gravity, Urine: 1.005 (ref 1.005–1.030)
pH: 6 (ref 5.0–8.0)

## 2021-03-19 LAB — CBC WITH DIFFERENTIAL/PLATELET
Abs Immature Granulocytes: 0.03 10*3/uL (ref 0.00–0.07)
Basophils Absolute: 0.1 10*3/uL (ref 0.0–0.1)
Basophils Relative: 1 %
Eosinophils Absolute: 0.2 10*3/uL (ref 0.0–0.5)
Eosinophils Relative: 2 %
HCT: 44.7 % (ref 39.0–52.0)
Hemoglobin: 15 g/dL (ref 13.0–17.0)
Immature Granulocytes: 0 %
Lymphocytes Relative: 24 %
Lymphs Abs: 2.6 10*3/uL (ref 0.7–4.0)
MCH: 30 pg (ref 26.0–34.0)
MCHC: 33.6 g/dL (ref 30.0–36.0)
MCV: 89.4 fL (ref 80.0–100.0)
Monocytes Absolute: 0.6 10*3/uL (ref 0.1–1.0)
Monocytes Relative: 6 %
Neutro Abs: 7.5 10*3/uL (ref 1.7–7.7)
Neutrophils Relative %: 67 %
Platelets: 239 10*3/uL (ref 150–400)
RBC: 5 MIL/uL (ref 4.22–5.81)
RDW: 13.7 % (ref 11.5–15.5)
WBC: 11 10*3/uL — ABNORMAL HIGH (ref 4.0–10.5)
nRBC: 0 % (ref 0.0–0.2)

## 2021-03-19 LAB — MAGNESIUM: Magnesium: 1.8 mg/dL (ref 1.7–2.4)

## 2021-03-19 LAB — BASIC METABOLIC PANEL
Anion gap: 8 (ref 5–15)
BUN: 12 mg/dL (ref 6–20)
CO2: 25 mmol/L (ref 22–32)
Calcium: 8.7 mg/dL — ABNORMAL LOW (ref 8.9–10.3)
Chloride: 106 mmol/L (ref 98–111)
Creatinine, Ser: 1.55 mg/dL — ABNORMAL HIGH (ref 0.61–1.24)
GFR, Estimated: 59 mL/min — ABNORMAL LOW (ref >60)
Glucose, Bld: 107 mg/dL — ABNORMAL HIGH (ref 70–99)
Potassium: 4.1 mmol/L (ref 3.5–5.1)
Sodium: 139 mmol/L (ref 135–145)

## 2021-03-19 LAB — TROPONIN I (HIGH SENSITIVITY)
Troponin I (High Sensitivity): 6876 ng/L (ref <18)
Troponin I (High Sensitivity): 4885 ng/L (ref <18)
Troponin I (High Sensitivity): 4180 ng/L (ref <18)

## 2021-03-19 LAB — LIPID PANEL
Cholesterol: 120 mg/dL (ref 0–200)
HDL: 42 mg/dL (ref >40)
LDL Cholesterol: 51 mg/dL (ref 0–99)
Total CHOL/HDL Ratio: 2.9 RATIO
Triglycerides: 133 mg/dL (ref <150)
VLDL: 27 mg/dL (ref 0–40)

## 2021-03-19 LAB — C-REACTIVE PROTEIN
CRP: 1.3 mg/dL — ABNORMAL HIGH (ref <1.0)
CRP: 1.5 mg/dL — ABNORMAL HIGH (ref <1.0)

## 2021-03-19 LAB — RESP PANEL BY RT-PCR (FLU A&B, COVID) ARPGX2
Influenza A by PCR: NEGATIVE
Influenza B by PCR: NEGATIVE
SARS Coronavirus 2 by RT PCR: NEGATIVE

## 2021-03-19 LAB — TSH: TSH: 2.456 u[IU]/mL (ref 0.350–4.500)

## 2021-03-19 LAB — HEPARIN LEVEL (UNFRACTIONATED): Heparin Unfractionated: 0.18 IU/mL — ABNORMAL LOW (ref 0.30–0.70)

## 2021-03-19 LAB — CREATININE, URINE, RANDOM: Creatinine, Urine: 13.45 mg/dL

## 2021-03-19 LAB — SEDIMENTATION RATE
Sed Rate: 4 mm/hr (ref 0–16)
Sed Rate: 5 mm/hr (ref 0–16)

## 2021-03-19 LAB — CK: Total CK: 363 U/L (ref 49–397)

## 2021-03-19 LAB — BRAIN NATRIURETIC PEPTIDE: B Natriuretic Peptide: 417.7 pg/mL — ABNORMAL HIGH (ref 0.0–100.0)

## 2021-03-19 LAB — HIV ANTIBODY (ROUTINE TESTING W REFLEX): HIV Screen 4th Generation wRfx: NONREACTIVE

## 2021-03-19 LAB — D-DIMER, QUANTITATIVE: D-Dimer, Quant: 2.42 ug/mL-FEU — ABNORMAL HIGH (ref 0.00–0.50)

## 2021-03-19 LAB — SODIUM, URINE, RANDOM: Sodium, Ur: 111 mmol/L

## 2021-03-19 MED ORDER — SPIRONOLACTONE 12.5 MG HALF TABLET
12.5000 mg | ORAL_TABLET | Freq: Every day | ORAL | Status: DC
  Administered 2021-03-20 – 2021-03-22 (×3): 12.5 mg via ORAL
  Filled 2021-03-19 (×3): qty 1

## 2021-03-19 MED ORDER — ASPIRIN EC 81 MG PO TBEC
81.0000 mg | DELAYED_RELEASE_TABLET | Freq: Every day | ORAL | Status: DC
  Administered 2021-03-19 – 2021-03-22 (×3): 81 mg via ORAL
  Filled 2021-03-19 (×3): qty 1

## 2021-03-19 MED ORDER — IOHEXOL 350 MG/ML SOLN
100.0000 mL | Freq: Once | INTRAVENOUS | Status: AC | PRN
  Administered 2021-03-19: 100 mL via INTRAVENOUS

## 2021-03-19 MED ORDER — BENZONATATE 100 MG PO CAPS: 100.0000 mg | ORAL_CAPSULE | Freq: Three times a day (TID) | ORAL | Status: DC | PRN

## 2021-03-19 MED ORDER — ENOXAPARIN SODIUM 60 MG/0.6ML IJ SOSY
60.0000 mg | PREFILLED_SYRINGE | Freq: Every day | INTRAMUSCULAR | Status: DC
  Administered 2021-03-19 – 2021-03-21 (×3): 60 mg via SUBCUTANEOUS
  Filled 2021-03-19 (×3): qty 0.6

## 2021-03-19 MED ORDER — PERFLUTREN LIPID MICROSPHERE
1.0000 mL | INTRAVENOUS | Status: AC | PRN
  Administered 2021-03-19: 3 mL via INTRAVENOUS
  Filled 2021-03-19: qty 10

## 2021-03-19 MED ORDER — FUROSEMIDE 10 MG/ML IJ SOLN
40.0000 mg | Freq: Every day | INTRAMUSCULAR | Status: DC
  Administered 2021-03-19: 40 mg via INTRAVENOUS
  Filled 2021-03-19: qty 4

## 2021-03-19 MED ORDER — HEPARIN BOLUS VIA INFUSION
6000.0000 [IU] | Freq: Once | INTRAVENOUS | Status: AC
  Administered 2021-03-19: 6000 [IU] via INTRAVENOUS

## 2021-03-19 MED ORDER — DOXYCYCLINE HYCLATE 100 MG PO TABS
100.0000 mg | ORAL_TABLET | Freq: Once | ORAL | Status: AC
  Administered 2021-03-19: 100 mg via ORAL
  Filled 2021-03-19: qty 1

## 2021-03-19 MED ORDER — HYDRALAZINE HCL 25 MG PO TABS: 25.0000 mg | ORAL_TABLET | Freq: Four times a day (QID) | ORAL | Status: DC | PRN

## 2021-03-19 MED ORDER — CARVEDILOL 3.125 MG PO TABS
3.1250 mg | ORAL_TABLET | Freq: Two times a day (BID) | ORAL | Status: DC
  Administered 2021-03-19: 3.125 mg via ORAL
  Filled 2021-03-19: qty 1

## 2021-03-19 MED ORDER — SODIUM CHLORIDE 0.9% FLUSH: 3.0000 mL | INTRAVENOUS | Status: DC | PRN

## 2021-03-19 MED ORDER — NICOTINE 21 MG/24HR TD PT24
21.0000 mg | MEDICATED_PATCH | Freq: Every day | TRANSDERMAL | Status: DC
Start: 2021-03-19 — End: 2021-03-22
  Filled 2021-03-19 (×4): qty 1

## 2021-03-19 MED ORDER — ONDANSETRON HCL 4 MG/2ML IJ SOLN: 4.0000 mg | Freq: Four times a day (QID) | INTRAMUSCULAR | Status: DC | PRN

## 2021-03-19 MED ORDER — ACETAMINOPHEN 325 MG PO TABS: 650.0000 mg | ORAL_TABLET | ORAL | Status: DC | PRN

## 2021-03-19 MED ORDER — DIGOXIN 125 MCG PO TABS: 0.1250 mg | ORAL_TABLET | Freq: Every day | ORAL | Status: DC

## 2021-03-19 MED ORDER — SODIUM CHLORIDE 0.9% FLUSH
3.0000 mL | Freq: Two times a day (BID) | INTRAVENOUS | Status: DC
  Administered 2021-03-19 – 2021-03-22 (×4): 3 mL via INTRAVENOUS

## 2021-03-19 MED ORDER — SODIUM CHLORIDE 0.9 % IV SOLN: 250.0000 mL | INTRAVENOUS | Status: DC | PRN

## 2021-03-19 MED ORDER — HEPARIN (PORCINE) 25000 UT/250ML-% IV SOLN
1700.0000 [IU]/h | INTRAVENOUS | Status: DC
Start: 2021-03-19 — End: 2021-03-19
  Administered 2021-03-19: 1700 [IU]/h via INTRAVENOUS
  Filled 2021-03-19: qty 250

## 2021-03-19 NOTE — ED Notes (Signed)
Report given to [facility] charge. ?

## 2021-03-19 NOTE — H&P (Signed)
History and Physical    Robert Daniels BWI:[id number] DOB: 09-19-1985 DOA: 03/19/2021  PCP: Pcp, No (Confirm with patient/family/NH records and if not entered, this has to be entered at [facility] point of entry) Patient coming from: Home  I have personally briefly reviewed patient's old medical records in [place]  Chief Complaint: Chest pain, SOB  HPI: Robert Daniels is a 36 y.o. male with no significant past medical history came with new onset of chest pain, cough and SOB.  Symptoms started 3 days ago, with new onset of squeezing-like chest pain and new onset of productive cough with clear phlegm, and exertional dyspnea.  Denies any fever or chills.  Chest pain is localized 3-4/10, worsening with cough and exertion.  He also reported several episodes of such chest pain and night.  He took some ibuprofen with some relief.  December 2022, patient had flulike symptoms with cough and fever, and he went to see urgent care was diagnosed with right-sided pneumonia and was treated for 7 days of combination of 3 antibiotics.  He uses smokeless tobacco, denies any drug use.  ED Course: Patient was found tachycardia and tachypneic, blood pressure elevated, O2 sat ration dropped to 89% with minimal activity.  Troponins 8600> 8400> 8300  Chest x-ray showed cardiomegaly and pulmonary edema.  CT angiogram negative for PE  Review of Systems: As per HPI otherwise 14 point review of systems negative.    History reviewed. No pertinent past medical history.  History reviewed. No pertinent surgical history.   reports that he has been smoking cigars and cigarettes. He has never used smokeless tobacco. He reports current alcohol use. He reports that he does not use drugs.  No Known Allergies  History reviewed. No pertinent family history.   Prior to Admission medications   Medication Sig Start Date End Date Taking? Authorizing Provider  amoxicillin-clavulanate (AUGMENTIN) 875-125 MG tablet  Take 1 tablet by mouth every 12 (twelve) hours. 12/19/20   Quintella Reichert, MD  azithromycin (ZITHROMAX) 250 MG tablet Take 1 tablet (250 mg total) by mouth daily. Take one tablet by mouth once daily 12/19/20   Quintella Reichert, MD  benzonatate (TESSALON) 100 MG capsule Take 1 capsule (100 mg total) by mouth 3 (three) times daily as needed for cough. 12/19/20   Quintella Reichert, MD  cyclobenzaprine (FLEXERIL) 10 MG tablet Take 1 tablet (10 mg total) by mouth 3 (three) times daily as needed for muscle spasms. 03/17/20   Drenda Freeze, MD  ibuprofen (ADVIL) 600 MG tablet Take 1 tablet (600 mg total) by mouth every 6 (six) hours as needed. 03/17/20   Drenda Freeze, MD    Physical Exam: Vitals:   03/19/21 1145 03/19/21 1230 03/19/21 1245 03/19/21 1300  BP: (!) 141/97 132/88 (!) 133/116 129/81  Pulse: 90 92 97 73  Resp: 19 (!) 38 (!) 30 (!) 35  Temp:      TempSrc:      SpO2: 93% 92% (!) 89% 91%  Weight:      Height:        Constitutional: NAD, calm, comfortable Vitals:   03/19/21 1145 03/19/21 1230 03/19/21 1245 03/19/21 1300  BP: (!) 141/97 132/88 (!) 133/116 129/81  Pulse: 90 92 97 73  Resp: 19 (!) 38 (!) 30 (!) 35  Temp:      TempSrc:      SpO2: 93% 92% (!) 89% 91%  Weight:      Height:       Eyes:  PERRL, lids and conjunctivae normal ENMT: Mucous membranes are moist. Posterior pharynx clear of any exudate or lesions.Normal dentition.  Neck: normal, supple, no masses, no thyromegaly Respiratory: clear to auscultation bilaterally, no wheezing, fine crackles on bilateral lower fields.  Increasing respiratory effort. No accessory muscle use.  Cardiovascular: Regular rate and rhythm, no murmurs / rubs / gallops. No extremity edema. 2+ pedal pulses. No carotid bruits.  Abdomen: no tenderness, no masses palpated. No hepatosplenomegaly. Bowel sounds positive.  Musculoskeletal: no clubbing / cyanosis. No joint deformity upper and lower extremities. Good ROM, no contractures. Normal muscle  tone.  Skin: no rashes, lesions, ulcers. No induration Neurologic: CN 2-12 grossly intact. Sensation intact, DTR normal. Strength 5/5 in all 4.  Psychiatric: Normal judgment and insight. Alert and oriented x 3. Normal mood.    Labs on Admission: I have personally reviewed following labs and imaging studies  CBC: Recent Labs  Lab 03/19/21 0712  WBC 11.0*  NEUTROABS 7.5  HGB 15.0  HCT 44.7  MCV 89.4  PLT 885   Basic Metabolic Panel: Recent Labs  Lab 03/19/21 0712 03/19/21 1225  NA 139  --   K 4.1  --   CL 106  --   CO2 25  --   GLUCOSE 107*  --   BUN 12  --   CREATININE 1.55*  --   CALCIUM 8.7*  --   MG  --  1.8   GFR: Estimated Creatinine Clearance: 93.3 mL/min (A) (by C-G formula based on SCr of 1.55 mg/dL (H)). Liver Function Tests: No results for input(s): AST, ALT, ALKPHOS, BILITOT, PROT, ALBUMIN in the last 168 hours. No results for input(s): LIPASE, AMYLASE in the last 168 hours. No results for input(s): AMMONIA in the last 168 hours. Coagulation Profile: No results for input(s): INR, PROTIME in the last 168 hours. Cardiac Enzymes: No results for input(s): CKTOTAL, CKMB, CKMBINDEX, TROPONINI in the last 168 hours. BNP (last 3 results) No results for input(s): PROBNP in the last 8760 hours. HbA1C: No results for input(s): HGBA1C in the last 72 hours. CBG: No results for input(s): GLUCAP in the last 168 hours. Lipid Profile: No results for input(s): CHOL, HDL, LDLCALC, TRIG, CHOLHDL, LDLDIRECT in the last 72 hours. Thyroid Function Tests: No results for input(s): TSH, T4TOTAL, FREET4, T3FREE, THYROIDAB in the last 72 hours. Anemia Panel: No results for input(s): VITAMINB12, FOLATE, FERRITIN, TIBC, IRON, RETICCTPCT in the last 72 hours. Urine analysis:    Component Value Date/Time   COLORURINE YELLOW 04/09/2012 [place] 04/09/2012 0651   LABSPEC 1.036 (H) 04/09/2012 0651   PHURINE 6.0 04/09/2012 0651   GLUCOSEU NEGATIVE 04/09/2012 0651    HGBUR NEGATIVE 04/09/2012 0651   BILIRUBINUR NEGATIVE 04/09/2012 0651   KETONESUR NEGATIVE 04/09/2012 0651   PROTEINUR 30 (A) 04/09/2012 0651   UROBILINOGEN 0.2 04/09/2012 0651   NITRITE NEGATIVE 04/09/2012 0651   LEUKOCYTESUR NEGATIVE 04/09/2012 0651    Radiological Exams on Admission: DG Chest 2 View  Result Date: 03/19/2021 CLINICAL DATA:  Productive cough with chest pain and shortness of breath EXAM: CHEST - 2 VIEW COMPARISON:  12/18/2020 FINDINGS: Cardiomegaly with thickened hila and bilateral fissure thickening. No visible effusion or pneumothorax. Negative aortic contours. No acute osseous finding. IMPRESSION: Cardiomegaly and vascular congestion. Haziness of the bilateral chest could be infection or alveolar edema. Electronically Signed   By: Jorje Guild M.D.   On: 03/19/2021 07:28   CT Angio Chest PE W and/or Wo Contrast  Result Date:  03/19/2021 CLINICAL DATA:  Concern for pulmonary embolism. Acute onset chest pain. Elevated troponin. Elevated D-dimer. EXAM: CT ANGIOGRAPHY CHEST WITH CONTRAST TECHNIQUE: Multidetector CT imaging of the chest was performed using the standard protocol during bolus administration of intravenous contrast. Multiplanar CT image reconstructions and MIPs were obtained to evaluate the vascular anatomy. RADIATION DOSE REDUCTION: This exam was performed according to the departmental dose-optimization program which includes automated exposure control, adjustment of the mA and/or kV according to patient size and/or use of iterative reconstruction technique. CONTRAST:  159m OMNIPAQUE IOHEXOL 350 MG/ML SOLN COMPARISON:  None. FINDINGS: Cardiovascular: No filling defects within the pulmonary arteries to suggest acute pulmonary embolism. No pericardial effusion. LEFT heart appears enlarged. Mediastinum/Nodes: No axillary or supraclavicular adenopathy. No mediastinal or hilar adenopathy. No pericardial fluid. Esophagus normal. Lungs/Pleura: There is nodular airspace  disease diffusely with a lower lobe predominance. Mild interlobular septal thickening in the upper lobes. No pleural effusion. Upper Abdomen: Limited view of the liver, kidneys, pancreas are unremarkable. Normal adrenal glands. Musculoskeletal: No aggressive osseous lesion. Review of the MIP images confirms the above findings. IMPRESSION: 1. No evidence acute pulmonary embolism. 2. Fine nodular airspace disease diffusely in the lower lobes suggest pulmonary edema. 3. LEFT heart appears enlarged. 4. No pericardial effusion. Findings conveyed toCHRISTOPHER TEGELER on 03/19/2021  at12:28. Electronically Signed   By: SSuzy BouchardM.D.   On: 03/19/2021 12:30    EKG: Independently reviewed. Sinus tachy, no acute ST changes.  Assessment/Plan Principal Problem:   Elevated troponin Active Problems:   CHF (congestive heart failure) (HCC)  (please populate well all problems here in Problem List. (For example, if patient is on BP meds at home and you resume or decide to hold them, it is a problem that needs to be her. Same for CAD, COPD, HLD and so on)  NSTEMI vs myocarditis -On heparin drip -Add ASA 81 mg daily -Coreg 3.125 mg BID -Hold off ACEI or ARB now for surge of kidney function -Echo today -Cardiology consult appreciated.  -Ischemia workup as per cardiology -Risk factor modification, cigarette smoke cessation consultation, check lipid panel. -For Ddx of myocarditis, agreed with CRP, ESR, will check Coxsackie A and B given recent PNA Hx. -TSH, UA, UDS  Acute CHF decompensation, likely systolic -Echo pending -Clinically patient fluid overload, start Lasix IV 40 mg daily, monitor kidney function. -Daily xray  AKI vs CKD -Clinically, patient has both symptoms and signs of fluid overload, suspect underlying cardiorenal syndrome decompensation. -Lasix 40 mg daily -Check renal ultrasound and FeNa  Elevated BP without diagnosis of HTN -Starting Coreg -PRN Hydralazine  Cigarette  smoke -Cessation consulted  Morbid obesity -Calorie control recommended.  DVT prophylaxis: Heparin drip Code Status: Full code Family Communication: None at bedside Disposition Plan: Patient is sick with new onset of CHF and question of underlying CAD, expect more than 2 midnight hospital stay.  Expect cardiology ischemia work-up. Consults called: Cardiology Admission status: PCU   PLequita HaltMD [facility] Pager [phone] 03/19/2021, 1:56 PM

## 2021-03-19 NOTE — ED Provider Notes (Signed)
11:05 AM ?Patient arrived from med [facility] for further evaluation and management prior to admission.  Patient has suspected pulmonary embolism but CT scanner was not available currently at the other facility.  Patient transferred ED to ED for evaluation. ? ?On arrival, he was denying any chest pain.  Still having some tachypnea and shortness of breath but oxygen saturations have improved on oxygen.  Troponin now downtrending. ? ?CT PE study was ordered as per previous team recommendations.  Plan of care will be to call critical care if there is evidence of large PE. ? ?Anticipate admission after work-up is completed. ? ?12:24 PM ?I personally viewed the CT scan both in the scanner and after and then spoke to radiology.  No evidence of effusion.  He does have some edema in the lungs. ?Myocarditis or even ACS as the cause.  We will call cardiology to discuss plan.  ? ? ?Cardiology will see to see him in consultation with agree with admission to medicine. ? ?Patient admitted to medicine for further management. ? ? ?Clinical Impression: ?1. NSTEMI (non-ST elevated myocardial infarction) (HCC)   ?2. Shortness of breath   ?3. Congestive heart failure, unspecified HF chronicity, unspecified heart failure type (HCC)   ?4. CHF (congestive heart failure) (HCC)   ?5. AKI (acute kidney injury) (HCC)   ? ? ?Disposition: Admit ? ?This note was prepared with assistance of Conservation officer, historic buildings. Occasional wrong-word or sound-a-like substitutions may have occurred due to the inherent limitations of voice recognition software. ? ? ?  ?Faruq Rosenberger, Canary Brim, MD ?03/19/21 1523 ? ?

## 2021-03-19 NOTE — Plan of Care (Addendum)
Transfer from [facility]. ?Robert Daniels is a 36 year old male with PMH of obesity who complaints of productive cough, CP, and SOB.  O2 sats maintained on 2 L of nasal cannula oxygen.  Labs significant for high-sensitivity troponin 6876-> 4885, BNP 417.7, hemoglobin 11, BUN 12, and creatinine 1.55.  EKG noted sinus tachycardia 101 bpm with signs of left atrial enlargement chest x-ray noted cardiomegaly with vascular congestion and hazy of the bilateral chest concerning for infection or edema.  Patient has been started on a heparin drip.  Concern for massive PE, but CT scaner down at [facility].  Notified in regards to possible admission, but critical care also consulted due to concern for large PE with possible need of thrombolytics.  Transferring ED to ED.  ?

## 2021-03-19 NOTE — ED Provider Notes (Signed)
[facility] EMERGENCY DEPARTMENT Provider Note   CSN: [id number] Arrival date & time: 03/19/21  3837     History  Chief Complaint  Patient presents with   Shortness of Breath    Robert Daniels is a 36 y.o. male presenting to the ED with left sided chest pressure and shortness of breath for 2 days.  Reports intermittent discomfort in his chest, feeling winded.  Denies leg swelling.  Denies hx of DVT, PE, MI, CAD.  Reports he takes no medications at baseline.  He does smoke and vape nicotine; denies other illicit drug use.  Per record review, patient diagnosed with RML and RLL PNA in Dec 2022 in the ED, treated with Augmentin at the time.  Had negative covid/flu swab at the time.  WBC was 13.6.  Ddimer was < 0.27.    Patient today reports his symptoms currently feel different - he does not have the fever, chills, he had last time.  HPI     Home Medications Prior to Admission medications   Medication Sig Start Date End Date Taking? Authorizing Provider  amoxicillin-clavulanate (AUGMENTIN) 875-125 MG tablet Take 1 tablet by mouth every 12 (twelve) hours. 12/19/20   Tilden Fossa, MD  azithromycin (ZITHROMAX) 250 MG tablet Take 1 tablet (250 mg total) by mouth daily. Take one tablet by mouth once daily 12/19/20   Tilden Fossa, MD  benzonatate (TESSALON) 100 MG capsule Take 1 capsule (100 mg total) by mouth 3 (three) times daily as needed for cough. 12/19/20   Tilden Fossa, MD  cyclobenzaprine (FLEXERIL) 10 MG tablet Take 1 tablet (10 mg total) by mouth 3 (three) times daily as needed for muscle spasms. 03/17/20   Charlynne Pander, MD  ibuprofen (ADVIL) 600 MG tablet Take 1 tablet (600 mg total) by mouth every 6 (six) hours as needed. 03/17/20   Charlynne Pander, MD      Allergies    Patient has no known allergies.    Review of Systems   Review of Systems  Physical Exam Updated Vital Signs BP (!) 138/110    Pulse 79    Temp 98.7 F (37.1 C) (Oral)    Resp (!) 34     Ht 6' (1.829 m)    Wt 131.5 kg    SpO2 97%    BMI 39.33 kg/m  Physical Exam Constitutional:      General: He is not in acute distress.    Appearance: He is obese.  HENT:     Head: Normocephalic and atraumatic.  Eyes:     Conjunctiva/sclera: Conjunctivae normal.     Pupils: Pupils are equal, round, and reactive to light.  Cardiovascular:     Rate and Rhythm: Regular rhythm. Tachycardia present.  Pulmonary:     Effort: Pulmonary effort is normal. No respiratory distress.     Comments: 90% on room air, 95% on 2L [place] Crackles and rhonchi in bilateral lower lung bases RR 30 Abdominal:     General: There is no distension.     Tenderness: There is no abdominal tenderness.  Musculoskeletal:     Right lower leg: No edema.     Left lower leg: No edema.  Skin:    General: Skin is warm and dry.  Neurological:     General: No focal deficit present.     Mental Status: He is alert and oriented to person, place, and time. Mental status is at baseline.  Psychiatric:        Mood  and Affect: Mood normal.        Behavior: Behavior normal.    ED Results / Procedures / Treatments   Labs (all labs ordered are listed, but only abnormal results are displayed) Labs Reviewed  BASIC METABOLIC PANEL - Abnormal; Notable for the following components:      Result Value   Glucose, Bld 107 (*)    Creatinine, Ser 1.55 (*)    Calcium 8.7 (*)    GFR, Estimated 59 (*)    All other components within normal limits  CBC WITH DIFFERENTIAL/PLATELET - Abnormal; Notable for the following components:   WBC 11.0 (*)    All other components within normal limits  BRAIN NATRIURETIC PEPTIDE - Abnormal; Notable for the following components:   B Natriuretic Peptide 417.7 (*)    All other components within normal limits  D-DIMER, QUANTITATIVE - Abnormal; Notable for the following components:   D-Dimer, Quant 2.42 (*)    All other components within normal limits  TROPONIN I (HIGH SENSITIVITY) - Abnormal; Notable for  the following components:   Troponin I (High Sensitivity) 6,876 (*)    All other components within normal limits  TROPONIN I (HIGH SENSITIVITY) - Abnormal; Notable for the following components:   Troponin I (High Sensitivity) 4,885 (*)    All other components within normal limits  RESP PANEL BY RT-PCR (FLU A&B, COVID) [id number]  HEPARIN LEVEL (UNFRACTIONATED)  RAPID URINE DRUG SCREEN, HOSP PERFORMED    EKG EKG Interpretation  Date/Time:  Wednesday March 19 2021 06:58:50 EST Ventricular Rate:  101 PR Interval:  166 QRS Duration: 94 QT Interval:  364 QTC Calculation: 472 R Axis:   77 Text Interpretation: Sinus tachycardia Probable left atrial enlargement Anterior infarct, old Confirmed by Alvester Chou [phone]) on 03/19/2021 7:05:26 AM  Radiology DG Chest 2 View  Result Date: 03/19/2021 CLINICAL DATA:  Productive cough with chest pain and shortness of breath EXAM: CHEST - 2 VIEW COMPARISON:  12/18/2020 FINDINGS: Cardiomegaly with thickened hila and bilateral fissure thickening. No visible effusion or pneumothorax. Negative aortic contours. No acute osseous finding. IMPRESSION: Cardiomegaly and vascular congestion. Haziness of the bilateral chest could be infection or alveolar edema. Electronically Signed   By: Tiburcio Pea M.D.   On: 03/19/2021 07:28    Procedures .Critical Care Performed by: Terald Sleeper, MD Authorized by: Terald Sleeper, MD   Critical care provider statement:    Critical care time (minutes):  45   Critical care time was exclusive of:  Separately billable procedures and treating other patients   Critical care was necessary to treat or prevent imminent or life-threatening deterioration of the following conditions:  Circulatory failure and respiratory failure   Critical care was time spent personally by me on the following activities:  Ordering and performing treatments and interventions, ordering and review of laboratory studies, ordering and review of  radiographic studies, pulse oximetry, review of old charts, examination of patient and evaluation of patient's response to treatment Comments:     Oxygen therapy, heparin for suspected PE    Medications Ordered in ED Medications  heparin ADULT infusion 100 units/mL (25000 units/253mL) (1,700 Units/hr Intravenous New Bag/Given 03/19/21 0901)  doxycycline (VIBRA-TABS) tablet 100 mg (100 mg Oral Given 03/19/21 0858)  heparin bolus via infusion 6,000 Units (6,000 Units Intravenous Bolus from Bag 03/19/21 0902)    ED Course/ Medical Decision Making/ A&P Clinical Course as of 03/19/21 1011  Wed Mar 19, 2021  0744 IMPRESSION: Cardiomegaly and vascular congestion. Haziness of  the bilateral chest could be infection or alveolar edema. [MT]  0746 Ddimer elevated.  Awaiting covid result. CT scanner is down, unavailable for advanced imaging at the moment. [MT]  1610 SARS Coronavirus 2 by RT PCR: NEGATIVE [MT]  0811 Influenza A By PCR: NEGATIVE [MT]  0837 Trop elevated - high clinical suspicion for PE.  Will start on heparin.  No evidence of cardiogenic shock; holding on tPA.  Pt will require transfer to [facility] for echocardiogram, PE study.  He has no chest pain and no STEMI on ECG; cardiology can be consulted upon his arrival at [facility] [MT]  [phone] We will also start CAP coverage for possible infection.  Rocephin + doxycycline.  Lower suspicion for sepsis clinically.  Holding on fluids as he has some vascular/pulmonary edema and hypoxia already, will monitor BP [MT]  0912 Awaiting hospitalist callback for admission [MT]  [phone] Due to need for emergent imaging to direct further care, I have discussed with [facility] ED team transfer ED to ED to obtain stat CT PE study.  I spoke to Brett Canales Minor from Critical Care who requested that Crit Care be consulted following imaging, as deemed necessary, if findings are consistent with massive PE. [MT]  [phone] Drs Adela Lank, Tegelor EDPs accepting at [facility] [MT]  1010 Dr Katrinka Blazing hospitalist  requests that the appropriate admission team be contacted by ED team following CT scan, regarding admission to stepdown vs ICU. [MT]    Clinical Course User Index [MT] Makeyla Govan, Kermit Balo, MD                           Medical Decision Making Amount and/or Complexity of Data Reviewed Labs: ordered. Decision-making details documented in ED Course. Radiology: ordered.  Risk Prescription drug management. Decision regarding hospitalization.   This patient presents to the ED with concern for shortness of breath, chest discomfort. This involves an extensive number of treatment options, and is a complaint that carries with it a high risk of complications and morbidity.  The differential diagnosis includes recurring PNA vs viral infection vs new onset CHF vs PE vs vape-induced lung injury vs other  Co-morbidities that complicate the patient evaluation: smoking/vape use at higher risk for lung injury  No hx of asthma, and I do not appreciate wheezing to suggestive reactive airway disease.  I ordered and personally interpreted labs.  The pertinent results include:  BNP, Trop, ddimer elevated, WBC 11.  I ordered imaging studies including dg chest I independently visualized and interpreted imaging which showed cardiomegaly, bilateral pulmonary edema I agree with the radiologist interpretation  The patient was maintained on a cardiac monitor.  I personally viewed and interpreted the cardiac monitored which showed an underlying rhythm of: sinus tachycardia, HR approx 100  Per my interpretation the patient's ECG shows sinus rhythm without acute ischemic findings - machine read of anterior infarct, old, likely over-read of minor q waves which may be physiological for age.  I ordered medication including supplemental oxygen for tachypnea, O2 of 90% on room air.  Test Considered:  - High clinical suspicion for PE, unable to obtain CT PE study as CT machine is down   After the interventions noted  above, I reevaluated the patient and found that they have: stayed the same - breathing comfortably on 2L [place]   Dispostion:  After consideration of the diagnostic results and the patients response to treatment, I feel that the patent would benefit from medical admission.  Final Clinical Impression(s) / ED Diagnoses Final diagnoses:  NSTEMI (non-ST elevated myocardial infarction) (HCC)  Shortness of breath  Congestive heart failure, unspecified HF chronicity, unspecified heart failure type [facility])    Rx / DC Orders ED Discharge Orders     None         Terald Sleeper, MD 03/19/21 1011

## 2021-03-19 NOTE — Progress Notes (Signed)
?  Transition of Care (TOC) Screening Note ? ? ?Patient Details  ?Name: Robert Daniels ?Date of Birth: May 02, 1985 ? ? ?Transition of Care (TOC) CM/SW Contact:    ?Linsy Ehresman, LCSW ?Phone Number: ?03/19/2021, 5:10 PM ? ? ? ?Transition of Care Department [facility]) has reviewed patient and no TOC needs have been identified at this time. We will continue to monitor patient advancement through interdisciplinary progression rounds. If new patient transition needs arise, please place a TOC consult. ?  ?

## 2021-03-19 NOTE — Progress Notes (Signed)
ANTICOAGULATION CONSULT NOTE - Initial Consult ? ?Pharmacy Consult for heparin ?Indication: r/o pulmonary embolus ? ?No Known Allergies ? ?Patient Measurements: ?Height: 6' (182.9 cm) ?Weight: 131.5 kg (290 lb) ?IBW/kg (Calculated) : 77.6 ?Heparin Dosing Weight: 107kg ? ?Vital Signs: ?Temp: 98.7 ?F (37.1 ?C) (03/08 8295) ?Temp Source: Oral (03/08 6213) ?BP: 138/110 (03/08 0831) ?Pulse Rate: 79 (03/08 0831) ? ?Labs: ?Recent Labs  ?  03/19/21 ?0712  ?HGB 15.0  ?HCT 44.7  ?PLT 239  ?CREATININE 1.55*  ?TROPONINIHS 0,865*  ? ? ?Estimated Creatinine Clearance: 93.3 mL/min (A) (by C-G formula based on SCr of 1.55 mg/dL (H)). ? ? ?Medical History: ?History reviewed. No pertinent past medical history. ? ?Medications:  ?Infusions:  ? heparin    ? ? ?Assessment: ?66 yom presented to the ED with SOB. High clinical suspicion for PE per MD. Unable to get CT at this time but will start heparin IV for r/o PE. Baseline CBC is WNL and he is not on anticoagulation PTA.  ? ?Goal of Therapy:  ?Heparin level 0.3-0.7 units/ml ?Monitor platelets by anticoagulation protocol: Yes ?  ?Plan:  ?Heparin bolus 6000 units IV x 1 ?Heparin gtt 1700 units/hr ?Check a 6 hr heparin level ?Daily heparin level and CBC  ?F/u CT  ? ?Artemisa Sladek, Drake Leach ?03/19/2021,8:44 AM ? ? ?

## 2021-03-19 NOTE — Consult Note (Addendum)
Cardiology Consultation:   Patient ID: ZAHMIR LALLA MRN: [id number]; DOB: 01/01/1986  Admit date: 03/19/2021 Date of Consult: 03/19/2021  PCP:  Merryl Hacker, No   [facility] Providers Cardiologist:  None      Patient Profile:   Robert Daniels is a 35 y.o. male with a hx of obesity who is being seen 03/19/2021 for the evaluation of chest pain at the request of Dr. Tamala Julian.  History of Present Illness:   Robert Daniels is a 36 year old with above medical history who has never seen a cardiologist before. Per chart review, patient does not have a PCP and typically seeks medical care from the ED. Patient was seen in the ED 3 times in 2022. He was seen in March 2022 for chest wall pain that was musculoskeletal. Was later seen in April 2022 for a viral syndrome. Most recently was seen in December 2022 for RML and RLL PNA.   Patient presented to the ED at [facility] on 3/8 complaining of a cough with chest pain and SOB. Patient admits to smoking/vape use. Patient was tachycardic and hypoxic in the ED, and there initially was concern for a massive PE. However, the CT scanner was not working at [facility], so the patient was transferred to [facility].   Pertinent labs include hsTn [phone]. BNP 417.7. Creatinine 1.55. GFR 59. Hemoglobin 15.0.  EKG showed sinus tachycardia, probable left atrial enlargement.  CT chest showed no evidence of acute pulmonary embolism, fine nodular airspace disease diffusely in the lower lobes suggestive of pulmonary edema.   On interview, patient reports that he has had intermittent chest pain for the past 4 days.  Chest pain is located in the center of his chest, feels like tightness.  Does not radiate.  Pain is worse with cough/exertion/deep breathing, however is mildly present at rest. Pain is sometimes present at  night.  Associated with shortness of breath.  Denies any palpitations.  Denies any fevers, chills, body aches.  Has had a mild cough.  Patient admits to  smoking/vaping every day, does not have a healthy diet, is not active.  Does not have any personal or family cardiac history.  Does not take any medicines at home.  History reviewed. No pertinent past medical history.  History reviewed. No pertinent surgical history.   Home Medications:  Prior to Admission medications   Medication Sig Start Date End Date Taking? Authorizing Provider  amoxicillin-clavulanate (AUGMENTIN) 875-125 MG tablet Take 1 tablet by mouth every 12 (twelve) hours. 12/19/20   Quintella Reichert, MD  azithromycin (ZITHROMAX) 250 MG tablet Take 1 tablet (250 mg total) by mouth daily. Take one tablet by mouth once daily 12/19/20   Quintella Reichert, MD  benzonatate (TESSALON) 100 MG capsule Take 1 capsule (100 mg total) by mouth 3 (three) times daily as needed for cough. 12/19/20   Quintella Reichert, MD  cyclobenzaprine (FLEXERIL) 10 MG tablet Take 1 tablet (10 mg total) by mouth 3 (three) times daily as needed for muscle spasms. 03/17/20   Drenda Freeze, MD  ibuprofen (ADVIL) 600 MG tablet Take 1 tablet (600 mg total) by mouth every 6 (six) hours as needed. 03/17/20   Drenda Freeze, MD    Inpatient Medications: Scheduled Meds:  Continuous Infusions:  heparin 1,700 Units/hr (03/19/21 0901)   PRN Meds:   Allergies:   No Known Allergies  Social History:   Social History   Socioeconomic History   Marital status: Single    Spouse name: Not  on file   Number of children: Not on file   Years of education: Not on file   Highest education level: Not on file  Occupational History   Not on file  Tobacco Use   Smoking status: Every Day    Types: Cigars, Cigarettes   Smokeless tobacco: Never  Vaping Use   Vaping Use: Never used  Substance and Sexual Activity   Alcohol use: Yes    Comment: occ   Drug use: Never   Sexual activity: Not on file  Other Topics Concern   Not on file  Social History Narrative   Not on file   Social Determinants of Health   Financial  Resource Strain: Not on file  Food Insecurity: Not on file  Transportation Needs: Not on file  Physical Activity: Not on file  Stress: Not on file  Social Connections: Not on file  Intimate Partner Violence: Not on file    Family History:   History reviewed. No pertinent family history.   ROS:  Please see the history of present illness.  All other ROS reviewed and negative.     Physical Exam/Data:   Vitals:   03/19/21 1145 03/19/21 1230 03/19/21 1245 03/19/21 1300  BP: (!) 141/97 132/88 (!) 133/116 129/81  Pulse: 90 92 97 73  Resp: 19 (!) 38 (!) 30 (!) 35  Temp:      TempSrc:      SpO2: 93% 92% (!) 89% 91%  Weight:      Height:       No intake or output data in the 24 hours ending 03/19/21 1341 Last 3 Weights 03/19/2021 12/18/2020 04/17/2020  Weight (lbs) 290 lb 208 lb 300 lb  Weight (kg) 131.543 kg 94.348 kg 136.079 kg     Body mass index is 39.33 kg/m.  General:  Well nourished, well developed, in no acute distress. Laying comfortably in the bed HEENT: normal Neck: no JVD Vascular: Radial pulses 2+ bilaterally Cardiac:  normal S1, S2; RRR; no murmur. S3 gallop present  Lungs:  clear to auscultation bilaterally, no wheezing or rhonchi. Mild rales at lung bases.  Abd: soft, nontender, no hepatomegaly  Ext: no edema Musculoskeletal:  No deformities, BUE and BLE strength normal and equal Skin: warm and dry  Neuro:  CNs 2-12 intact, no focal abnormalities noted Psych:  Normal affect   EKG:  The EKG was personally reviewed and demonstrates:  Sinus tachycardia (rate 101) with left atrial enlargement  Telemetry:  Telemetry was personally reviewed and demonstrates:  sinus rhythm with PVCs  Relevant CV Studies:    Laboratory Data:  High Sensitivity Troponin:   Recent Labs  Lab 03/19/21 0712 03/19/21 0907 03/19/21 1225  TROPONINIHS 6,876* 4,885* 4,180*     Chemistry Recent Labs  Lab 03/19/21 0712 03/19/21 1225  NA 139  --   K 4.1  --   CL 106  --   CO2 25  --    GLUCOSE 107*  --   BUN 12  --   CREATININE 1.55*  --   CALCIUM 8.7*  --   MG  --  1.8  GFRNONAA 59*  --   ANIONGAP 8  --     No results for input(s): PROT, ALBUMIN, AST, ALT, ALKPHOS, BILITOT in the last 168 hours. Lipids No results for input(s): CHOL, TRIG, HDL, LABVLDL, LDLCALC, CHOLHDL in the last 168 hours.  Hematology Recent Labs  Lab 03/19/21 0712  WBC 11.0*  RBC 5.00  HGB 15.0  HCT  44.7  MCV 89.4  MCH 30.0  MCHC 33.6  RDW 13.7  PLT 239   Thyroid No results for input(s): TSH, FREET4 in the last 168 hours.  BNP Recent Labs  Lab 03/19/21 0712  BNP 417.7*    DDimer  Recent Labs  Lab 03/19/21 0712  DDIMER 2.42*     Radiology/Studies:  DG Chest 2 View  Result Date: 03/19/2021 CLINICAL DATA:  Productive cough with chest pain and shortness of breath EXAM: CHEST - 2 VIEW COMPARISON:  12/18/2020 FINDINGS: Cardiomegaly with thickened hila and bilateral fissure thickening. No visible effusion or pneumothorax. Negative aortic contours. No acute osseous finding. IMPRESSION: Cardiomegaly and vascular congestion. Haziness of the bilateral chest could be infection or alveolar edema. Electronically Signed   By: Jorje Guild M.D.   On: 03/19/2021 07:28   CT Angio Chest PE W and/or Wo Contrast  Result Date: 03/19/2021 CLINICAL DATA:  Concern for pulmonary embolism. Acute onset chest pain. Elevated troponin. Elevated D-dimer. EXAM: CT ANGIOGRAPHY CHEST WITH CONTRAST TECHNIQUE: Multidetector CT imaging of the chest was performed using the standard protocol during bolus administration of intravenous contrast. Multiplanar CT image reconstructions and MIPs were obtained to evaluate the vascular anatomy. RADIATION DOSE REDUCTION: This exam was performed according to the departmental dose-optimization program which includes automated exposure control, adjustment of the mA and/or kV according to patient size and/or use of iterative reconstruction technique. CONTRAST:  123m OMNIPAQUE  IOHEXOL 350 MG/ML SOLN COMPARISON:  None. FINDINGS: Cardiovascular: No filling defects within the pulmonary arteries to suggest acute pulmonary embolism. No pericardial effusion. LEFT heart appears enlarged. Mediastinum/Nodes: No axillary or supraclavicular adenopathy. No mediastinal or hilar adenopathy. No pericardial fluid. Esophagus normal. Lungs/Pleura: There is nodular airspace disease diffusely with a lower lobe predominance. Mild interlobular septal thickening in the upper lobes. No pleural effusion. Upper Abdomen: Limited view of the liver, kidneys, pancreas are unremarkable. Normal adrenal glands. Musculoskeletal: No aggressive osseous lesion. Review of the MIP images confirms the above findings. IMPRESSION: 1. No evidence acute pulmonary embolism. 2. Fine nodular airspace disease diffusely in the lower lobes suggest pulmonary edema. 3. LEFT heart appears enlarged. 4. No pericardial effusion. Findings conveyed toCHRISTOPHER TEGELER on 03/19/2021  at12:28. Electronically Signed   By: SSuzy BouchardM.D.   On: 03/19/2021 12:30     Assessment and Plan:   Chest Pain  - hsTn 6876>>4885>>4180, continuing to cycle  - EKG showed sinus tachycardia with probable left atrial enlargement, no ischemic changes - CXR showed new cardiomegaly  - Echocardiogram formal read pending, initial read showed LVEF 25% with severely decreased LF systolic function, global hypokinesis  - Possible myocarditis vs ACS. Although patient does have risk factors for CAD (smoking, obesity, diet), his age, symptoms, EKG/CXR findings, newly reduced EF, and duration of chest pain are more consistent with myocarditis - CRP and ESR pending  - Patient was started on IV heparin due to suspicion of ACS. - Patient should undergo cardiac catheterization to rule out ACS. Patient's creatinine is somewhat elevated and eGFR is down to 59, will decide the scheduling of the cath after echocardiogram is resulted.   Acute Systolic Heart  Failure - BNP elevated to 717.7 - Echo this admission showed LVEF 25% - Pulmonary edema on CXR/CT chest, some crackles in lung bases - Started coreg 3.125     Risk Assessment/Risk Scores:   HEAR Score (for undifferentiated chest pain):  HEAR Score: 2{    For questions or updates, please contact C[place]Please consult [email or web address]  for contact info under    Signed, Margie Billet, PA-C  03/19/2021 1:41 PM   Patient seen and examined with the above-signed Advanced Practice Provider and/or Housestaff. I personally reviewed laboratory data, imaging studies and relevant notes. I independently examined the patient and formulated the important aspects of the plan. I have edited the note to reflect any of my changes or salient points. I have personally discussed the plan with the patient and/or family.  36 y/o male with no known cardiac history presents with several day h/o pericarditic-type CP and SOB. Found to have elevated trop (flat ~5k) and BNP. Echo with EF 20-25% and global HK. Ecg with simus tach ~100. No ST-T wave abnormalities.   General:  obese male  No resp difficulty HEENT: normal Neck: supple. JVP to jaw Carotids 2+ bilat; no bruits. No lymphadenopathy or thryomegaly appreciated. Cor: PMI nondisplaced. Regular tachy +s3. No rubs, gallops or murmurs. Lungs: decreased at bases Abdomen: obese soft, nontender, nondistended. No hepatosplenomegaly. No bruits or masses. Good bowel sounds. Extremities: no cyanosis, clubbing, rash, 1+ edema Neuro: alert & orientedx3, cranial nerves grossly intact. moves all 4 extremities w/o difficulty. 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 Discussed need for R/L cath tomorrow. Agrees to proceed. Will start GDMT. Would not start b-blocker yet until more compensated. Will likely need cMRI and colchicine as well. Drug screen negative. Check viral panel. Outpatient sleep study.   Glori Bickers, MD   9:40 PM

## 2021-03-19 NOTE — H&P (View-Only) (Signed)
Cardiology Consultation:   Patient ID: Robert Daniels MRN: [id number]; DOB: Mar 18, 1985  Admit date: 03/19/2021 Date of Consult: 03/19/2021  PCP:  Robert Daniels, No   [facility] Providers Cardiologist:  None      Patient Profile:   Robert Daniels is a 36 y.o. male with a hx of obesity who is being seen 03/19/2021 for the evaluation of chest pain at the request of Dr. Tamala Julian.  History of Present Illness:   Robert Daniels is a 36 year old with above medical history who has never seen a cardiologist before. Per chart review, patient does not have a PCP and typically seeks medical care from the ED. Patient was seen in the ED 3 times in 2022. He was seen in March 2022 for chest wall pain that was musculoskeletal. Was later seen in April 2022 for a viral syndrome. Most recently was seen in December 2022 for RML and RLL PNA.   Patient presented to the ED at [facility] on 3/8 complaining of a cough with chest pain and SOB. Patient admits to smoking/vape use. Patient was tachycardic and hypoxic in the ED, and there initially was concern for a massive PE. However, the CT scanner was not working at [facility], so the patient was transferred to [facility].   Pertinent labs include hsTn [id number]. BNP 417.7. Creatinine 1.55. GFR 59. Hemoglobin 15.0.  EKG showed sinus tachycardia, probable left atrial enlargement.  CT chest showed no evidence of acute pulmonary embolism, fine nodular airspace disease diffusely in the lower lobes suggestive of pulmonary edema.   On interview, patient reports that he has had intermittent chest pain for the past 4 days.  Chest pain is located in the center of his chest, feels like tightness.  Does not radiate.  Pain is worse with cough/exertion/deep breathing, however is mildly present at rest. Pain is sometimes present at  night.  Associated with shortness of breath.  Denies any palpitations.  Denies any fevers, chills, body aches.  Has had a mild cough.  Patient admits to  smoking/vaping every day, does not have a healthy diet, is not active.  Does not have any personal or family cardiac history.  Does not take any medicines at home.  History reviewed. No pertinent past medical history.  History reviewed. No pertinent surgical history.   Home Medications:  Prior to Admission medications   Medication Sig Start Date End Date Taking? Authorizing Provider  amoxicillin-clavulanate (AUGMENTIN) 875-125 MG tablet Take 1 tablet by mouth every 12 (twelve) hours. 12/19/20   Quintella Reichert, MD  azithromycin (ZITHROMAX) 250 MG tablet Take 1 tablet (250 mg total) by mouth daily. Take one tablet by mouth once daily 12/19/20   Quintella Reichert, MD  benzonatate (TESSALON) 100 MG capsule Take 1 capsule (100 mg total) by mouth 3 (three) times daily as needed for cough. 12/19/20   Quintella Reichert, MD  cyclobenzaprine (FLEXERIL) 10 MG tablet Take 1 tablet (10 mg total) by mouth 3 (three) times daily as needed for muscle spasms. 03/17/20   Drenda Freeze, MD  ibuprofen (ADVIL) 600 MG tablet Take 1 tablet (600 mg total) by mouth every 6 (six) hours as needed. 03/17/20   Drenda Freeze, MD    Inpatient Medications: Scheduled Meds:  Continuous Infusions:  heparin 1,700 Units/hr (03/19/21 0901)   PRN Meds:   Allergies:   No Known Allergies  Social History:   Social History   Socioeconomic History   Marital status: Single    Spouse name: Not  on file   Number of children: Not on file   Years of education: Not on file   Highest education level: Not on file  Occupational History   Not on file  Tobacco Use   Smoking status: Every Day    Types: Cigars, Cigarettes   Smokeless tobacco: Never  Vaping Use   Vaping Use: Never used  Substance and Sexual Activity   Alcohol use: Yes    Comment: occ   Drug use: Never   Sexual activity: Not on file  Other Topics Concern   Not on file  Social History Narrative   Not on file   Social Determinants of Health   Financial  Resource Strain: Not on file  Food Insecurity: Not on file  Transportation Needs: Not on file  Physical Activity: Not on file  Stress: Not on file  Social Connections: Not on file  Intimate Partner Violence: Not on file    Family History:   History reviewed. No pertinent family history.   ROS:  Please see the history of present illness.  All other ROS reviewed and negative.     Physical Exam/Data:   Vitals:   03/19/21 1145 03/19/21 1230 03/19/21 1245 03/19/21 1300  BP: (!) 141/97 132/88 (!) 133/116 129/81  Pulse: 90 92 97 73  Resp: 19 (!) 38 (!) 30 (!) 35  Temp:      TempSrc:      SpO2: 93% 92% (!) 89% 91%  Weight:      Height:       No intake or output data in the 24 hours ending 03/19/21 1341 Last 3 Weights 03/19/2021 12/18/2020 04/17/2020  Weight (lbs) 290 lb 208 lb 300 lb  Weight (kg) 131.543 kg 94.348 kg 136.079 kg     Body mass index is 39.33 kg/m.  General:  Well nourished, well developed, in no acute distress. Laying comfortably in the bed HEENT: normal Neck: no JVD Vascular: Radial pulses 2+ bilaterally Cardiac:  normal S1, S2; RRR; no murmur. S3 gallop present  Lungs:  clear to auscultation bilaterally, no wheezing or rhonchi. Mild rales at lung bases.  Abd: soft, nontender, no hepatomegaly  Ext: no edema Musculoskeletal:  No deformities, BUE and BLE strength normal and equal Skin: warm and dry  Neuro:  CNs 2-12 intact, no focal abnormalities noted Psych:  Normal affect   EKG:  The EKG was personally reviewed and demonstrates:  Sinus tachycardia (rate 101) with left atrial enlargement  Telemetry:  Telemetry was personally reviewed and demonstrates:  sinus rhythm with PVCs  Relevant CV Studies:    Laboratory Data:  High Sensitivity Troponin:   Recent Labs  Lab 03/19/21 0712 03/19/21 0907 03/19/21 1225  TROPONINIHS 6,876* 4,885* 4,180*     Chemistry Recent Labs  Lab 03/19/21 0712 03/19/21 1225  NA 139  --   K 4.1  --   CL 106  --   CO2 25  --    GLUCOSE 107*  --   BUN 12  --   CREATININE 1.55*  --   CALCIUM 8.7*  --   MG  --  1.8  GFRNONAA 59*  --   ANIONGAP 8  --     No results for input(s): PROT, ALBUMIN, AST, ALT, ALKPHOS, BILITOT in the last 168 hours. Lipids No results for input(s): CHOL, TRIG, HDL, LABVLDL, LDLCALC, CHOLHDL in the last 168 hours.  Hematology Recent Labs  Lab 03/19/21 0712  WBC 11.0*  RBC 5.00  HGB 15.0  HCT  44.7  MCV 89.4  MCH 30.0  MCHC 33.6  RDW 13.7  PLT 239   Thyroid No results for input(s): TSH, FREET4 in the last 168 hours.  BNP Recent Labs  Lab 03/19/21 0712  BNP 417.7*    DDimer  Recent Labs  Lab 03/19/21 0712  DDIMER 2.42*     Radiology/Studies:  DG Chest 2 View  Result Date: 03/19/2021 CLINICAL DATA:  Productive cough with chest pain and shortness of breath EXAM: CHEST - 2 VIEW COMPARISON:  12/18/2020 FINDINGS: Cardiomegaly with thickened hila and bilateral fissure thickening. No visible effusion or pneumothorax. Negative aortic contours. No acute osseous finding. IMPRESSION: Cardiomegaly and vascular congestion. Haziness of the bilateral chest could be infection or alveolar edema. Electronically Signed   By: Jorje Guild M.D.   On: 03/19/2021 07:28   CT Angio Chest PE W and/or Wo Contrast  Result Date: 03/19/2021 CLINICAL DATA:  Concern for pulmonary embolism. Acute onset chest pain. Elevated troponin. Elevated D-dimer. EXAM: CT ANGIOGRAPHY CHEST WITH CONTRAST TECHNIQUE: Multidetector CT imaging of the chest was performed using the standard protocol during bolus administration of intravenous contrast. Multiplanar CT image reconstructions and MIPs were obtained to evaluate the vascular anatomy. RADIATION DOSE REDUCTION: This exam was performed according to the departmental dose-optimization program which includes automated exposure control, adjustment of the mA and/or kV according to patient size and/or use of iterative reconstruction technique. CONTRAST:  169m OMNIPAQUE  IOHEXOL 350 MG/ML SOLN COMPARISON:  None. FINDINGS: Cardiovascular: No filling defects within the pulmonary arteries to suggest acute pulmonary embolism. No pericardial effusion. LEFT heart appears enlarged. Mediastinum/Nodes: No axillary or supraclavicular adenopathy. No mediastinal or hilar adenopathy. No pericardial fluid. Esophagus normal. Lungs/Pleura: There is nodular airspace disease diffusely with a lower lobe predominance. Mild interlobular septal thickening in the upper lobes. No pleural effusion. Upper Abdomen: Limited view of the liver, kidneys, pancreas are unremarkable. Normal adrenal glands. Musculoskeletal: No aggressive osseous lesion. Review of the MIP images confirms the above findings. IMPRESSION: 1. No evidence acute pulmonary embolism. 2. Fine nodular airspace disease diffusely in the lower lobes suggest pulmonary edema. 3. LEFT heart appears enlarged. 4. No pericardial effusion. Findings conveyed toCHRISTOPHER TEGELER on 03/19/2021  at12:28. Electronically Signed   By: SSuzy BouchardM.D.   On: 03/19/2021 12:30     Assessment and Plan:   Chest Pain  - hsTn 6876>>4885>>4180, continuing to cycle  - EKG showed sinus tachycardia with probable left atrial enlargement, no ischemic changes - CXR showed new cardiomegaly  - Echocardiogram formal read pending, initial read showed LVEF 25% with severely decreased LF systolic function, global hypokinesis  - Possible myocarditis vs ACS. Although patient does have risk factors for CAD (smoking, obesity, diet), his age, symptoms, EKG/CXR findings, newly reduced EF, and duration of chest pain are more consistent with myocarditis - CRP and ESR pending  - Patient was started on IV heparin due to suspicion of ACS. - Patient should undergo cardiac catheterization to rule out ACS. Patient's creatinine is somewhat elevated and eGFR is down to 59, will decide the scheduling of the cath after echocardiogram is resulted.   Acute Systolic Heart  Failure - BNP elevated to 717.7 - Echo this admission showed LVEF 25% - Pulmonary edema on CXR/CT chest, some crackles in lung bases - Started coreg 3.125     Risk Assessment/Risk Scores:   HEAR Score (for undifferentiated chest pain):  HEAR Score: 2{    For questions or updates, please contact CEast PalestinePlease consult [email or web address]  for contact info under    Signed, Margie Billet, PA-C  03/19/2021 1:41 PM   Patient seen and examined with the above-signed Advanced Practice Provider and/or Housestaff. I personally reviewed laboratory data, imaging studies and relevant notes. I independently examined the patient and formulated the important aspects of the plan. I have edited the note to reflect any of my changes or salient points. I have personally discussed the plan with the patient and/or family.  36 y/o male with no known cardiac history presents with several day h/o pericarditic-type CP and SOB. Found to have elevated trop (flat ~5k) and BNP. Echo with EF 20-25% and global HK. Ecg with simus tach ~100. No ST-T wave abnormalities.   General:  obese male  No resp difficulty HEENT: normal Neck: supple. JVP to jaw Carotids 2+ bilat; no bruits. No lymphadenopathy or thryomegaly appreciated. Cor: PMI nondisplaced. Regular tachy +s3. No rubs, gallops or murmurs. Lungs: decreased at bases Abdomen: obese soft, nontender, nondistended. No hepatosplenomegaly. No bruits or masses. Good bowel sounds. Extremities: no cyanosis, clubbing, rash, 1+ edema Neuro: alert & orientedx3, cranial nerves grossly intact. moves all 4 extremities w/o difficulty. 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 Discussed need for R/L cath tomorrow. Agrees to proceed. Will start GDMT. Would not start b-blocker yet until more compensated. Will likely need cMRI and colchicine as well. Drug screen negative. Check viral panel. Outpatient sleep study.   Glori Bickers, MD   9:40 PM

## 2021-03-19 NOTE — Progress Notes (Signed)
?  Echocardiogram ?2D Echocardiogram has been performed. ? ?Fidel Levy ?03/19/2021, 2:45 PM ?

## 2021-03-19 NOTE — ED Triage Notes (Signed)
Pt c/o prodctive cough, with chest pain and shob x 1 week. Denies fever.  ?

## 2021-03-20 ENCOUNTER — Encounter (HOSPITAL_COMMUNITY)
Admission: EM | Disposition: A | Discharge status: Home / Self Care | Payer: Self-pay | Source: Home / Self Care | Attending: Internal Medicine

## 2021-03-20 ENCOUNTER — Other Ambulatory Visit (HOSPITAL_COMMUNITY): Payer: Self-pay

## 2021-03-20 ENCOUNTER — Encounter (HOSPITAL_COMMUNITY): Payer: Self-pay | Admitting: Internal Medicine

## 2021-03-20 ENCOUNTER — Inpatient Hospital Stay (HOSPITAL_COMMUNITY): Payer: Self-pay

## 2021-03-20 DIAGNOSIS — I5021 Acute systolic (congestive) heart failure: Secondary | ICD-10-CM

## 2021-03-20 DIAGNOSIS — N179 Acute kidney failure, unspecified: Secondary | ICD-10-CM

## 2021-03-20 DIAGNOSIS — I509 Heart failure, unspecified: Secondary | ICD-10-CM

## 2021-03-20 DIAGNOSIS — F17210 Nicotine dependence, cigarettes, uncomplicated: Secondary | ICD-10-CM

## 2021-03-20 DIAGNOSIS — Z6841 Body Mass Index (BMI) 40.0 and over, adult: Secondary | ICD-10-CM

## 2021-03-20 DIAGNOSIS — B3322 Viral myocarditis: Secondary | ICD-10-CM

## 2021-03-20 DIAGNOSIS — I10 Essential (primary) hypertension: Secondary | ICD-10-CM

## 2021-03-20 HISTORY — PX: RIGHT/LEFT HEART CATH AND CORONARY ANGIOGRAPHY: CATH118266

## 2021-03-20 LAB — POCT I-STAT EG7
Acid-Base Excess: 1 mmol/L (ref 0.0–2.0)
Acid-base deficit: 1 mmol/L (ref 0.0–2.0)
Bicarbonate: 23.7 mmol/L (ref 20.0–28.0)
Bicarbonate: 25.9 mmol/L (ref 20.0–28.0)
Calcium, Ion: 1.06 mmol/L — ABNORMAL LOW (ref 1.15–1.40)
Calcium, Ion: 1.13 mmol/L — ABNORMAL LOW (ref 1.15–1.40)
HCT: 41 % (ref 39.0–52.0)
HCT: 43 % (ref 39.0–52.0)
Hemoglobin: 13.9 g/dL (ref 13.0–17.0)
Hemoglobin: 14.6 g/dL (ref 13.0–17.0)
O2 Saturation: 68 %
O2 Saturation: 68 %
Potassium: 3 mmol/L — ABNORMAL LOW (ref 3.5–5.1)
Potassium: 3.3 mmol/L — ABNORMAL LOW (ref 3.5–5.1)
Sodium: 142 mmol/L (ref 135–145)
Sodium: 145 mmol/L (ref 135–145)
TCO2: 25 mmol/L (ref 22–32)
TCO2: 27 mmol/L (ref 22–32)
pCO2, Ven: 38.5 mmHg — ABNORMAL LOW (ref 44–60)
pCO2, Ven: 40.8 mmHg — ABNORMAL LOW (ref 44–60)
pH, Ven: 7.397 (ref 7.25–7.43)
pH, Ven: 7.411 (ref 7.25–7.43)
pO2, Ven: 35 mmHg (ref 32–45)
pO2, Ven: 35 mmHg (ref 32–45)

## 2021-03-20 LAB — BASIC METABOLIC PANEL
Anion gap: 11 (ref 5–15)
BUN: 9 mg/dL (ref 6–20)
CO2: 22 mmol/L (ref 22–32)
Calcium: 9 mg/dL (ref 8.9–10.3)
Chloride: 106 mmol/L (ref 98–111)
Creatinine, Ser: 1.42 mg/dL — ABNORMAL HIGH (ref 0.61–1.24)
GFR, Estimated: >60 mL/min (ref >60)
Glucose, Bld: 75 mg/dL (ref 70–99)
Potassium: 3.3 mmol/L — ABNORMAL LOW (ref 3.5–5.1)
Sodium: 139 mmol/L (ref 135–145)

## 2021-03-20 LAB — COXSACKIE A VIRUS ANTIBODIES
Coxsackie A16 IgG: 1:800 {titer} — ABNORMAL HIGH
Coxsackie A16 IgM: NEGATIVE titer
Coxsackie A24 IgG: 1:1600 {titer} — ABNORMAL HIGH
Coxsackie A24 IgM: NEGATIVE titer
Coxsackie A7 IgG: 1:800 {titer} — ABNORMAL HIGH
Coxsackie A7 IgM: NEGATIVE titer
Coxsackie A9 IgG: 1:800 {titer} — ABNORMAL HIGH
Coxsackie A9 IgM: NEGATIVE titer

## 2021-03-20 LAB — POCT I-STAT 7, (LYTES, BLD GAS, ICA,H+H)
Acid-base deficit: 2 mmol/L (ref 0.0–2.0)
Bicarbonate: 21.9 mmol/L (ref 20.0–28.0)
Calcium, Ion: 1.02 mmol/L — ABNORMAL LOW (ref 1.15–1.40)
HCT: 40 % (ref 39.0–52.0)
Hemoglobin: 13.6 g/dL (ref 13.0–17.0)
O2 Saturation: 94 %
Potassium: 3 mmol/L — ABNORMAL LOW (ref 3.5–5.1)
Sodium: 144 mmol/L (ref 135–145)
TCO2: 23 mmol/L (ref 22–32)
pCO2 arterial: 34 mmHg (ref 32–48)
pH, Arterial: 7.416 (ref 7.35–7.45)
pO2, Arterial: 69 mmHg — ABNORMAL LOW (ref 83–108)

## 2021-03-20 LAB — CARDIAC CATHETERIZATION: Cath EF Quantitative: 25 %

## 2021-03-20 LAB — CBC
HCT: 45.1 % (ref 39.0–52.0)
Hemoglobin: 15.2 g/dL (ref 13.0–17.0)
MCH: 29.6 pg (ref 26.0–34.0)
MCHC: 33.7 g/dL (ref 30.0–36.0)
MCV: 87.7 fL (ref 80.0–100.0)
Platelets: 234 10*3/uL (ref 150–400)
RBC: 5.14 MIL/uL (ref 4.22–5.81)
RDW: 13.5 % (ref 11.5–15.5)
WBC: 9.4 10*3/uL (ref 4.0–10.5)
nRBC: 0 % (ref 0.0–0.2)

## 2021-03-20 LAB — HEPARIN LEVEL (UNFRACTIONATED): Heparin Unfractionated: 0.18 IU/mL — ABNORMAL LOW (ref 0.30–0.70)

## 2021-03-20 SURGERY — RIGHT/LEFT HEART CATH AND CORONARY ANGIOGRAPHY
Anesthesia: LOCAL

## 2021-03-20 MED ORDER — MIDAZOLAM HCL 2 MG/2ML IJ SOLN
INTRAMUSCULAR | Status: DC | PRN
Start: 2021-03-20 — End: 2021-03-20
  Administered 2021-03-20: 2 mg via INTRAVENOUS

## 2021-03-20 MED ORDER — LIDOCAINE HCL (PF) 1 % IJ SOLN
INTRAMUSCULAR | Status: AC
  Filled 2021-03-20: qty 30

## 2021-03-20 MED ORDER — HEPARIN SODIUM (PORCINE) 1000 UNIT/ML IJ SOLN
INTRAMUSCULAR | Status: DC | PRN
  Administered 2021-03-20: 6000 [IU] via INTRAVENOUS

## 2021-03-20 MED ORDER — ENOXAPARIN SODIUM 40 MG/0.4ML IJ SOSY: 40.0000 mg | PREFILLED_SYRINGE | INTRAMUSCULAR | Status: DC

## 2021-03-20 MED ORDER — HEPARIN (PORCINE) IN NACL 1000-0.9 UT/500ML-% IV SOLN
INTRAVENOUS | Status: AC
Start: 2021-03-20 — End: ?
  Filled 2021-03-20: qty 1000

## 2021-03-20 MED ORDER — HEPARIN SODIUM (PORCINE) 1000 UNIT/ML IJ SOLN
INTRAMUSCULAR | Status: AC
  Filled 2021-03-20: qty 10

## 2021-03-20 MED ORDER — HYDRALAZINE HCL 20 MG/ML IJ SOLN: 10.0000 mg | INTRAMUSCULAR | Status: AC | PRN

## 2021-03-20 MED ORDER — SODIUM CHLORIDE 0.9 % IV SOLN: 250.0000 mL | INTRAVENOUS | Status: DC | PRN

## 2021-03-20 MED ORDER — COLCHICINE 0.3 MG HALF TABLET
0.3000 mg | ORAL_TABLET | Freq: Two times a day (BID) | ORAL | Status: DC
  Administered 2021-03-20 – 2021-03-22 (×5): 0.3 mg via ORAL
  Filled 2021-03-20 (×7): qty 1

## 2021-03-20 MED ORDER — SACUBITRIL-VALSARTAN 24-26 MG PO TABS
1.0000 | ORAL_TABLET | Freq: Two times a day (BID) | ORAL | Status: DC
  Administered 2021-03-20 – 2021-03-21 (×3): 1 via ORAL
  Filled 2021-03-20 (×3): qty 1

## 2021-03-20 MED ORDER — HEPARIN (PORCINE) IN NACL 1000-0.9 UT/500ML-% IV SOLN
INTRAVENOUS | Status: DC | PRN
  Administered 2021-03-20 (×2): 500 mL

## 2021-03-20 MED ORDER — GADOBUTROL 1 MMOL/ML IV SOLN
10.0000 mL | Freq: Once | INTRAVENOUS | Status: AC | PRN
  Administered 2021-03-20: 21:00:00 10 mL via INTRAVENOUS

## 2021-03-20 MED ORDER — FENTANYL CITRATE (PF) 100 MCG/2ML IJ SOLN
INTRAMUSCULAR | Status: AC
  Filled 2021-03-20: qty 2

## 2021-03-20 MED ORDER — SODIUM CHLORIDE 0.9% FLUSH
3.0000 mL | Freq: Two times a day (BID) | INTRAVENOUS | Status: DC
  Administered 2021-03-20: 09:00:00 3 mL via INTRAVENOUS

## 2021-03-20 MED ORDER — CARVEDILOL 3.125 MG PO TABS
3.1250 mg | ORAL_TABLET | Freq: Two times a day (BID) | ORAL | Status: DC
  Administered 2021-03-20 – 2021-03-22 (×4): 3.125 mg via ORAL
  Filled 2021-03-20 (×4): qty 1

## 2021-03-20 MED ORDER — LIDOCAINE HCL (PF) 1 % IJ SOLN
INTRAMUSCULAR | Status: DC | PRN
  Administered 2021-03-20: 5 mL

## 2021-03-20 MED ORDER — SODIUM CHLORIDE 0.9% FLUSH
3.0000 mL | Freq: Two times a day (BID) | INTRAVENOUS | Status: DC
  Administered 2021-03-20: 22:00:00 3 mL via INTRAVENOUS

## 2021-03-20 MED ORDER — MIDAZOLAM HCL 2 MG/2ML IJ SOLN
INTRAMUSCULAR | Status: AC
  Filled 2021-03-20: qty 2

## 2021-03-20 MED ORDER — ACETAMINOPHEN 325 MG PO TABS: 650.0000 mg | ORAL_TABLET | ORAL | Status: DC | PRN

## 2021-03-20 MED ORDER — SODIUM CHLORIDE 0.9% FLUSH: 3.0000 mL | INTRAVENOUS | Status: DC | PRN

## 2021-03-20 MED ORDER — VERAPAMIL HCL 2.5 MG/ML IV SOLN
INTRAVENOUS | Status: DC | PRN
  Administered 2021-03-20: 11:00:00 10 mL via INTRA_ARTERIAL

## 2021-03-20 MED ORDER — SODIUM CHLORIDE 0.9 % IV SOLN: INTRAVENOUS | Status: DC

## 2021-03-20 MED ORDER — FENTANYL CITRATE (PF) 100 MCG/2ML IJ SOLN
INTRAMUSCULAR | Status: DC | PRN
  Administered 2021-03-20: 25 ug via INTRAVENOUS

## 2021-03-20 MED ORDER — IOHEXOL 350 MG/ML SOLN
INTRAVENOUS | Status: DC | PRN
  Administered 2021-03-20: 11:00:00 40 mL via INTRACARDIAC

## 2021-03-20 MED ORDER — ASPIRIN 81 MG PO CHEW
81.0000 mg | CHEWABLE_TABLET | ORAL | Status: AC
  Administered 2021-03-20: 09:00:00 81 mg via ORAL
  Filled 2021-03-20: qty 1

## 2021-03-20 MED ORDER — LABETALOL HCL 5 MG/ML IV SOLN: 10.0000 mg | INTRAVENOUS | Status: AC | PRN

## 2021-03-20 MED ORDER — ONDANSETRON HCL 4 MG/2ML IJ SOLN: 4.0000 mg | Freq: Four times a day (QID) | INTRAMUSCULAR | Status: DC | PRN

## 2021-03-20 MED ORDER — POTASSIUM CHLORIDE CRYS ER 20 MEQ PO TBCR
20.0000 meq | EXTENDED_RELEASE_TABLET | Freq: Two times a day (BID) | ORAL | Status: AC
  Administered 2021-03-20 – 2021-03-21 (×3): 20 meq via ORAL
  Filled 2021-03-20 (×3): qty 2
  Filled 2021-03-20: qty 1
  Filled 2021-03-20 (×2): qty 2

## 2021-03-20 SURGICAL SUPPLY — 10 items
CATH BALLN WEDGE 5F 110CM (CATHETERS) ×1 IMPLANT
CATH INFINITI 5 FR JL3.5 (CATHETERS) ×1 IMPLANT
CATH INFINITI JR4 5F (CATHETERS) ×1 IMPLANT
DEVICE RAD COMP TR BAND LRG (VASCULAR PRODUCTS) ×1 IMPLANT
GLIDESHEATH SLEND SS 6F .021 (SHEATH) ×1 IMPLANT
GUIDEWIRE INQWIRE 1.5J.035X260 (WIRE) IMPLANT
INQWIRE 1.5J .035X260CM (WIRE) ×2
PACK CARDIAC CATHETERIZATION (CUSTOM PROCEDURE TRAY) ×3 IMPLANT
SHEATH GLIDE SLENDER 4/5FR (SHEATH) ×1 IMPLANT
TRANSDUCER W/STOPCOCK (MISCELLANEOUS) ×3 IMPLANT

## 2021-03-20 NOTE — Hospital Course (Addendum)
Robert Daniels is a 36 y.o. male with no significant past medical history presented to hospital with chest pain cough and shortness of breath for 2 to 3 days.  Patient did have a history of flulike symptoms in December 2022 when he was diagnosed of having right-sided pneumonia and was treated with 7-day course of antibiotic.  On discharge patient, patient was noted to be tachycardic tachypneic blood pressure was elevated and his pulse oxygen was around 89% on minimal activity.  Troponin was noted to be elevated at 8600> 8400> 8300. Chest x-ray showed cardiomegaly and pulmonary edema.  CT angiogram negative for PE.  Cardiology was consulted and patient was then considered for admission to hospital for further evaluation and treatment.,  Patient underwent cardiac catheterization on 03/20/2021 within normal coronaries.  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 ?

## 2021-03-20 NOTE — Assessment & Plan Note (Addendum)
Counseling done.  Received nicotine patch during hospitalization. ?

## 2021-03-20 NOTE — Assessment & Plan Note (Addendum)
Chest pain with elevated troponins.   Chest x-ray with cardiomegaly.  2D echocardiogram showed reduced LV function at 25% or so.  Patient was consulted.  Patient underwent cardiac catheterization on 03/20/2021 with normal coronaries but reduced LV function at 25%.  MRI of the heart showed diffuse myopericarditis ejection fraction at 15%..  Cardiology stated to cardiac medication medications including Entresto, spironolactone, Coreg.  Patient will need to follow-up with the heart failure clinic as has been scheduled.  Currently compensated prior to discharge. ?

## 2021-03-20 NOTE — Progress Notes (Signed)
[facility] rounding team consulted/following this admission. ? ?

## 2021-03-20 NOTE — Assessment & Plan Note (Signed)
Patient would benefit from weight loss as outpatient. 

## 2021-03-20 NOTE — Assessment & Plan Note (Addendum)
Improved on Coreg and Entresto.   ?

## 2021-03-20 NOTE — Interval H&P Note (Signed)
History and Physical Interval Note: ? ?03/20/2021 ?10:26 AM ? ?[name]  has presented today for surgery, with the diagnosis of myocarditis.  The various methods of treatment have been discussed with the patient and family. After consideration of risks, benefits and other options for treatment, the patient has consented to  Procedure(s): ?RIGHT/LEFT HEART CATH AND CORONARY ANGIOGRAPHY (N/A) and possible coronary angioplasty as a surgical intervention.  The patient's history has been reviewed, patient examined, no change in status, stable for surgery.  I have reviewed the patient's chart and labs.  Questions were answered to the patient's satisfaction.   ? ? ?Robert Daniels ? ? ?

## 2021-03-20 NOTE — Assessment & Plan Note (Addendum)
Acute systolic CHF.  Continue aspirin, Coreg, Entresto and Aldactone on discharge.  Low-salt and CHF instructions given ?

## 2021-03-20 NOTE — Progress Notes (Signed)
PROGRESS NOTE    Robert Daniels  ZOX:[id number] DOB: 03/06/1985 DOA: 03/19/2021 PCP: Pcp, No    Brief Narrative:  Robert Daniels is a 36 y.o. male with no significant past medical history presented to hospital with chest pain cough and shortness of breath for 2 to 3 days.  Patient did have a history of flulike symptoms in December 2022 when he was diagnosed of having right-sided pneumonia and was treated with 7-day course of antibiotic.  On discharge patient, patient was noted to be tachycardic tachypneic blood pressure was elevated and his pulse oxygen was around 89% on minimal activity.  Troponin was noted to be elevated at 8600> 8400> 8300. Chest x-ray showed cardiomegaly and pulmonary edema.  CT angiogram negative for PE.  Cardiology was consulted and patient was then considered for admission to hospital for further evaluation and treatment.     Assessment and Plan: * Myocarditis (HCC) Chest pain with elevated troponins.  Chest x-ray with cardiomegaly.  2D echocardiogram showed reduced LV function at 25% or so.  There is possibility of myocarditis as per cardiology but could not rule out acute coronary syndrome.  Cardiology recommends cardiac catheterization.  Creatinine is elevated so we will continue to monitor.  Continue digoxin, hydralazine, Aldactone, aspirin.  Patient received 1 dose of IV Lasix yesterday.  HTN (hypertension) Has been started on Coreg.  No formal diagnosis of hypertension.  Cigarette smoker Counseling done.  On nicotine patch.  AKI (acute kidney injury) (HCC) With signs of fluid overload so patient received Lasix.  2D echocardiogram shows reduced LV function.  Continue to monitor renal function.  Morbid obesity with BMI of 40.0-44.9, adult [facility]) Patient would benefit from weight loss as outpatient.  CHF (congestive heart failure) (HCC) Acute systolic CHF.  Continue aspirin, digoxin and Aldactone.  Continue Lasix daily.  Cardiology on board.  Strict intake and  output charting.  Daily weights.     DVT prophylaxis:   Lovenox   Code Status:     Code Status: Full Code  Disposition: Home Status is: Inpatient Remains inpatient appropriate because: Myocarditis, cardiomyopathy.   Family Communication: Spoke with the patient's grandmother at bedside  Consultants:  Cardiology  Procedures:  98  Antimicrobials:  None  Anti-infectives (From admission, onward)    Start     Dose/Rate Route Frequency Ordered Stop   03/19/21 0845  doxycycline (VIBRA-TABS) tablet 100 mg        100 mg Oral  Once 03/19/21 0837 03/19/21 0858        Subjective: Today, patient was seen and examined at bedside.  Patient's grandmother at bedside.  Denies any chest pain shortness of breath  Objective: Vitals:   03/20/21 1053 03/20/21 1058 03/20/21 1103 03/20/21 1111  BP: (!) 141/96 (!) 140/103  (!) 143/108  Pulse: 90 86 (!) 0 86  Resp: 17 (!) 23  (!) 21  Temp:    (!) 97.5 F (36.4 C)  TempSrc:    Oral  SpO2: 98% 95%  97%  Weight:      Height:        Intake/Output Summary (Last 24 hours) at 03/20/2021 1308 Last data filed at 03/20/2021 0455 Gross per 24 hour  Intake 240 ml  Output 1270 ml  Net -1030 ml   Filed Weights   03/19/21 0655 03/20/21 0453  Weight: 131.5 kg 128.4 kg   Body mass index is 38.39 kg/m.   Physical Examination:  General: Obese built, not in obvious distress HENT:   No scleral  pallor or icterus noted. Oral mucosa is moist.  Chest:  Clear breath sounds.  Diminished breath sounds bilaterally. No crackles or wheezes.  CVS: S1 &S2 heard. No murmur.  Regular rate and rhythm. Abdomen: Soft, nontender, nondistended.  Bowel sounds are heard.   Extremities: No cyanosis, clubbing or edema.  Peripheral pulses are palpable. Psych: Alert, awake and oriented, normal mood CNS:  No cranial nerve deficits.  Power equal in all extremities.   Skin: Warm and dry.  No rashes noted.  Data Reviewed:   CBC: Recent Labs  Lab 03/19/21 0712  03/20/21 0237  WBC 11.0* 9.4  NEUTROABS 7.5  --   HGB 15.0 15.2  HCT 44.7 45.1  MCV 89.4 87.7  PLT 239 234    Basic Metabolic Panel: Recent Labs  Lab 03/19/21 0712 03/19/21 1225 03/20/21 0237  NA 139  --  139  K 4.1  --  3.3*  CL 106  --  106  CO2 25  --  22  GLUCOSE 107*  --  75  BUN 12  --  9  CREATININE 1.55*  --  1.42*  CALCIUM 8.7*  --  9.0  MG  --  1.8  --     Liver Function Tests: No results for input(s): AST, ALT, ALKPHOS, BILITOT, PROT, ALBUMIN in the last 168 hours.   Radiology Studies: DG Chest 2 View  Result Date: 03/19/2021 CLINICAL DATA:  Productive cough with chest pain and shortness of breath EXAM: CHEST - 2 VIEW COMPARISON:  12/18/2020 FINDINGS: Cardiomegaly with thickened hila and bilateral fissure thickening. No visible effusion or pneumothorax. Negative aortic contours. No acute osseous finding. IMPRESSION: Cardiomegaly and vascular congestion. Haziness of the bilateral chest could be infection or alveolar edema. Electronically Signed   By: Tiburcio Pea M.D.   On: 03/19/2021 07:28   CT Angio Chest PE W and/or Wo Contrast  Result Date: 03/19/2021 CLINICAL DATA:  Concern for pulmonary embolism. Acute onset chest pain. Elevated troponin. Elevated D-dimer. EXAM: CT ANGIOGRAPHY CHEST WITH CONTRAST TECHNIQUE: Multidetector CT imaging of the chest was performed using the standard protocol during bolus administration of intravenous contrast. Multiplanar CT image reconstructions and MIPs were obtained to evaluate the vascular anatomy. RADIATION DOSE REDUCTION: This exam was performed according to the departmental dose-optimization program which includes automated exposure control, adjustment of the mA and/or kV according to patient size and/or use of iterative reconstruction technique. CONTRAST:  OMNIPAQUE IOHEXOL 350 MG/ML SOLN COMPARISON:  None. FINDINGS: Cardiovascular: No filling defects within the pulmonary arteries to suggest acute pulmonary embolism. No  pericardial effusion. LEFT heart appears enlarged. Mediastinum/Nodes: No axillary or supraclavicular adenopathy. No mediastinal or hilar adenopathy. No pericardial fluid. Esophagus normal. Lungs/Pleura: There is nodular airspace disease diffusely with a lower lobe predominance. Mild interlobular septal thickening in the upper lobes. No pleural effusion. Upper Abdomen: Limited view of the liver, kidneys, pancreas are unremarkable. Normal adrenal glands. Musculoskeletal: No aggressive osseous lesion. Review of the MIP images confirms the above findings. IMPRESSION: 1. No evidence acute pulmonary embolism. 2. Fine nodular airspace disease diffusely in the lower lobes suggest pulmonary edema. 3. LEFT heart appears enlarged. 4. No pericardial effusion. Findings conveyed toCHRISTOPHER TEGELER on 03/19/2021  at12:28. Electronically Signed   By: Genevive Bi M.D.   On: 03/19/2021 12:30   CARDIAC CATHETERIZATION  Result Date: 03/20/2021 Findings: RA = 3 RV = 32/10 PA = 34/20 (26) PCW = 17 Fick cardiac output/index = 6.1/2.5 PVR = 1.5 WU Ao sat = 94% PA  sat = 68%, 68% Assessment: 1. NICM  with EF 25%. Suspect myocarditis 2. Well-compensated filling pressures Plan/Discussion: Check cMRI. Titrate GDMT. Arvilla Meres, MD 11:06 AM  US RENAL  Result Date: 03/19/2021 CLINICAL DATA:  Acute renal insufficiency. EXAM: RENAL / URINARY TRACT ULTRASOUND COMPLETE COMPARISON:  None. FINDINGS: Right Kidney: Renal measurements: 11.2 x 6.3 x 5.1 cm = volume: 187.9 mL. Normal renal cortical thickness and echogenicity without focal lesions or hydronephrosis. Left Kidney: Renal measurements: 10.7 x 6.1 x 5.1 cm = volume: 172.0 mL. Normal renal cortical thickness and echogenicity without focal lesions or hydronephrosis. Bladder: Appears normal for degree of bladder distention. Other: None. IMPRESSION: Normal renal ultrasound examination. Electronically Signed   By: Rudie Meyer M.D.   On: 03/19/2021 18:11   DG CHEST PORT 1  VIEW  Result Date: 03/20/2021 CLINICAL DATA:  CHF EXAM: PORTABLE CHEST 1 VIEW COMPARISON:  Yesterday CT and radiography from yesterday FINDINGS: Cardiomegaly. Stable aortic and hilar contours. There is no edema, consolidation, effusion, or pneumothorax. Artifact from EKG leads. IMPRESSION: Improved pulmonary edema. Electronically Signed   By: Tiburcio Pea M.D.   On: 03/20/2021 06:37   ECHOCARDIOGRAM COMPLETE  Result Date: 03/19/2021    ECHOCARDIOGRAM REPORT   Patient Name:   Robert Daniels Date of Exam: 03/19/2021 Medical Rec #:  [id number]        Height:       72.0 in Accession #:    [id number]       Weight:       290.0 lb Date of Birth:  [birth year]        BSA:          2.495 m Patient Age:    35 years         BP:           129/81 mmHg Patient Gender: M                HR:           94 bpm. Exam Location:  Inpatient Procedure: 2D Echo, Cardiac Doppler, Color Doppler and Intracardiac            Opacification Agent Indications:    R07.9* Chest pain, unspecified  History:        Patient has no prior history of Echocardiogram examinations.  Sonographer:    Eulah Pont RDCS Referring Phys: [id number] Jonita Albee IMPRESSIONS  1. Left ventricular ejection fraction, by estimation, is 20 to 25%. The left ventricle has severely decreased function. The left ventricle demonstrates global hypokinesis. The left ventricular internal cavity size was severely dilated. Indeterminate diastolic filling due to E-A fusion.  2. Right ventricular systolic function is normal. The right ventricular size is normal. Tricuspid regurgitation signal is inadequate for assessing PA pressure.  3. Left atrial size was moderately dilated.  4. The mitral valve is normal in structure. Mild mitral valve regurgitation. No evidence of mitral stenosis.  5. The aortic valve is grossly normal. Aortic valve regurgitation is not visualized. No aortic stenosis is present.  6. Aortic dilatation noted. There is mild dilatation of the ascending aorta,  measuring 41 mm.  7. The inferior vena cava is normal in size with greater than 50% respiratory variability, suggesting right atrial pressure of 3 mmHg. Comparison(s): No prior Echocardiogram. Conclusion(s)/Recommendation(s): Severely reduced LVEF with global hypokinesis. Findings reported to Dr. Chipper Herb and Robet Leu, PA. FINDINGS  Left Ventricle: Contrast shows very sluggish flow at the LV apex without definitive thrombus. Left ventricular  ejection fraction, by estimation, is 20 to 25%. The left ventricle has severely decreased function. The left ventricle demonstrates global hypokinesis. Definity contrast agent was given IV to delineate the left ventricular endocardial borders. The left ventricular internal cavity size was severely dilated. There is no left ventricular hypertrophy. Indeterminate diastolic filling due to E-A fusion. Right Ventricle: The right ventricular size is normal. Right vetricular wall thickness was not well visualized. Right ventricular systolic function is normal. Tricuspid regurgitation signal is inadequate for assessing PA pressure. Left Atrium: Left atrial size was moderately dilated. Right Atrium: Right atrial size was normal in size. Pericardium: There is no evidence of pericardial effusion. Mitral Valve: The mitral valve is normal in structure. Mild mitral valve regurgitation. No evidence of mitral valve stenosis. Tricuspid Valve: The tricuspid valve is normal in structure. Tricuspid valve regurgitation is trivial. No evidence of tricuspid stenosis. Aortic Valve: The aortic valve is grossly normal. Aortic valve regurgitation is not visualized. No aortic stenosis is present. Pulmonic Valve: The pulmonic valve was not well visualized. Pulmonic valve regurgitation is not visualized. No evidence of pulmonic stenosis. Aorta: Aortic dilatation noted. There is mild dilatation of the ascending aorta, measuring 41 mm. Venous: The inferior vena cava is normal in size with greater than 50%  respiratory variability, suggesting right atrial pressure of 3 mmHg. IAS/Shunts: The atrial septum is grossly normal.  LEFT VENTRICLE PLAX 2D LVIDd:         7.00 cm LVIDs:         6.10 cm LV PW:         1.10 cm LV IVS:        1.20 cm LVOT diam:     2.40 cm      3D Volume EF: LV SV:         43           3D EF:        23 % LV SV Index:   17           LV EDV:       376 ml LVOT Area:     4.52 cm     LV ESV:       291 ml                             LV SV:        85 ml  LV Volumes (MOD) LV vol d, MOD A2C: 256.0 ml LV vol d, MOD A4C: 219.0 ml LV vol s, MOD A2C: 185.0 ml LV vol s, MOD A4C: 160.0 ml LV SV MOD A2C:     71.0 ml LV SV MOD A4C:     219.0 ml LV SV MOD BP:      65.6 ml RIGHT VENTRICLE RV S prime:     9.12 cm/s TAPSE (M-mode): 1.8 cm LEFT ATRIUM             Index        RIGHT ATRIUM           Index LA diam:        5.00 cm 2.00 cm/m   RA Area:     16.00 cm LA Vol (A2C):   85.1 ml 34.11 ml/m  RA Volume:   46.10 ml  18.48 ml/m LA Vol (A4C):   78.5 ml 31.46 ml/m LA Biplane Vol: 86.3 ml 34.59 ml/m  AORTIC VALVE LVOT Vmax:   60.60 cm/s LVOT  Vmean:  39.450 cm/s LVOT VTI:    0.094 m  AORTA Ao Root diam: 3.90 cm Ao Asc diam:  4.10 cm MR PISA:        0.57 cm MR PISA Radius: 0.30 cm   SHUNTS                           Systemic VTI:  0.09 m                           Systemic Diam: 2.40 cm Jodelle Red MD Electronically signed by Jodelle Red MD Signature Date/Time: 03/19/2021/3:45:32 PM    Final       LOS: 1 day    Joycelyn Das, MD [facility] 03/20/2021, 1:08 PM

## 2021-03-20 NOTE — Progress Notes (Addendum)
Advanced Heart Failure Rounding Note   Subjective:    Denies CP or SOB.   Cath today with normal cors EF 25%   Findings:  RA = 3 RV = 32/10 PA = 34/20 (26) PCW = 17 Fick cardiac output/index = 6.1/2.5 PVR = 1.5 WU Ao sat = 94% PA sat = 68%, 68%  EF 25%  Objective:   Weight Range:  Vital Signs:   Temp:  [98.2 F (36.8 C)-100.5 F (38.1 C)] 98.6 F (37 C) (03/09 0453) Pulse Rate:  [64-103] 89 (03/09 0453) Resp:  [17-38] 19 (03/09 0453) BP: (119-148)/(81-116) 132/91 (03/09 0453) SpO2:  [89 %-100 %] 100 % (03/09 0453) Weight:  [128.4 kg] 128.4 kg (03/09 0453) Last BM Date :  (PTA - pt states 3/7)  Weight change: Filed Weights   03/19/21 0655 03/20/21 0453  Weight: 131.5 kg 128.4 kg    Intake/Output:   Intake/Output Summary (Last 24 hours) at 03/20/2021 1059 Last data filed at 03/20/2021 0455 Gross per 24 hour  Intake 240 ml  Output 1270 ml  Net -1030 ml     Physical Exam: General:  Well appearing. No resp difficulty HEENT: normal Neck: supple. JVP flat. Carotids 2+ bilat; no bruits. No lymphadenopathy or thryomegaly appreciated. Cor: PMI nondisplaced. Regular rate & rhythm. No rubs, gallops or murmurs. Lungs: clear Abdomen: soft, nontender, nondistended. No hepatosplenomegaly. No bruits or masses. Good bowel sounds. Extremities: no cyanosis, clubbing, rash, edema Neuro: alert & orientedx3, cranial nerves grossly intact. moves all 4 extremities w/o difficulty. Affect pleasant  Telemetry: Sinus 80-90s Personally reviewed   Labs: Basic Metabolic Panel: Recent Labs  Lab 03/19/21 0712 03/19/21 1225 03/20/21 0237  NA 139  --  139  K 4.1  --  3.3*  CL 106  --  106  CO2 25  --  22  GLUCOSE 107*  --  75  BUN 12  --  9  CREATININE 1.55*  --  1.42*  CALCIUM 8.7*  --  9.0  MG  --  1.8  --     Liver Function Tests: No results for input(s): AST, ALT, ALKPHOS, BILITOT, PROT, ALBUMIN in the last 168 hours. No results for input(s): LIPASE, AMYLASE in  the last 168 hours. No results for input(s): AMMONIA in the last 168 hours.  CBC: Recent Labs  Lab 03/19/21 0712 03/20/21 0237  WBC 11.0* 9.4  NEUTROABS 7.5  --   HGB 15.0 15.2  HCT 44.7 45.1  MCV 89.4 87.7  PLT 239 234    Cardiac Enzymes: Recent Labs  Lab 03/19/21 1521  CKTOTAL 363    BNP: BNP (last 3 results) Recent Labs    03/19/21 0712  BNP 417.7*    ProBNP (last 3 results) No results for input(s): PROBNP in the last 8760 hours.    Other results:  Imaging: DG Chest 2 View  Result Date: 03/19/2021 CLINICAL DATA:  Productive cough with chest pain and shortness of breath EXAM: CHEST - 2 VIEW COMPARISON:  12/18/2020 FINDINGS: Cardiomegaly with thickened hila and bilateral fissure thickening. No visible effusion or pneumothorax. Negative aortic contours. No acute osseous finding. IMPRESSION: Cardiomegaly and vascular congestion. Haziness of the bilateral chest could be infection or alveolar edema. Electronically Signed   By: Tiburcio Pea M.D.   On: 03/19/2021 07:28   CT Angio Chest PE W and/or Wo Contrast  Result Date: 03/19/2021 CLINICAL DATA:  Concern for pulmonary embolism. Acute onset chest pain. Elevated troponin. Elevated D-dimer. EXAM: CT ANGIOGRAPHY CHEST  WITH CONTRAST TECHNIQUE: Multidetector CT imaging of the chest was performed using the standard protocol during bolus administration of intravenous contrast. Multiplanar CT image reconstructions and MIPs were obtained to evaluate the vascular anatomy. RADIATION DOSE REDUCTION: This exam was performed according to the departmental dose-optimization program which includes automated exposure control, adjustment of the mA and/or kV according to patient size and/or use of iterative reconstruction technique. CONTRAST:  OMNIPAQUE IOHEXOL 350 MG/ML SOLN COMPARISON:  None. FINDINGS: Cardiovascular: No filling defects within the pulmonary arteries to suggest acute pulmonary embolism. No pericardial effusion. LEFT  heart appears enlarged. Mediastinum/Nodes: No axillary or supraclavicular adenopathy. No mediastinal or hilar adenopathy. No pericardial fluid. Esophagus normal. Lungs/Pleura: There is nodular airspace disease diffusely with a lower lobe predominance. Mild interlobular septal thickening in the upper lobes. No pleural effusion. Upper Abdomen: Limited view of the liver, kidneys, pancreas are unremarkable. Normal adrenal glands. Musculoskeletal: No aggressive osseous lesion. Review of the MIP images confirms the above findings. IMPRESSION: 1. No evidence acute pulmonary embolism. 2. Fine nodular airspace disease diffusely in the lower lobes suggest pulmonary edema. 3. LEFT heart appears enlarged. 4. No pericardial effusion. Findings conveyed toCHRISTOPHER TEGELER on 03/19/2021  at12:28. Electronically Signed   By: Genevive Bi M.D.   On: 03/19/2021 12:30   US RENAL  Result Date: 03/19/2021 CLINICAL DATA:  Acute renal insufficiency. EXAM: RENAL / URINARY TRACT ULTRASOUND COMPLETE COMPARISON:  None. FINDINGS: Right Kidney: Renal measurements: 11.2 x 6.3 x 5.1 cm = volume: 187.9 mL. Normal renal cortical thickness and echogenicity without focal lesions or hydronephrosis. Left Kidney: Renal measurements: 10.7 x 6.1 x 5.1 cm = volume: 172.0 mL. Normal renal cortical thickness and echogenicity without focal lesions or hydronephrosis. Bladder: Appears normal for degree of bladder distention. Other: None. IMPRESSION: Normal renal ultrasound examination. Electronically Signed   By: Rudie Meyer M.D.   On: 03/19/2021 18:11   DG CHEST PORT 1 VIEW  Result Date: 03/20/2021 CLINICAL DATA:  CHF EXAM: PORTABLE CHEST 1 VIEW COMPARISON:  Yesterday CT and radiography from yesterday FINDINGS: Cardiomegaly. Stable aortic and hilar contours. There is no edema, consolidation, effusion, or pneumothorax. Artifact from EKG leads. IMPRESSION: Improved pulmonary edema. Electronically Signed   By: Tiburcio Pea M.D.   On: 03/20/2021  06:37   ECHOCARDIOGRAM COMPLETE  Result Date: 03/19/2021    ECHOCARDIOGRAM REPORT   Patient Name:   Robert Daniels Date of Exam: 03/19/2021 Medical Rec #:  [id number]        Height:       72.0 in Accession #:    [id number]       Weight:       290.0 lb Date of Birth:  Jan 08, 1986        BSA:          2.495 m Patient Age:    35 years         BP:           129/81 mmHg Patient Gender: M                HR:           94 bpm. Exam Location:  Inpatient Procedure: 2D Echo, Cardiac Doppler, Color Doppler and Intracardiac            Opacification Agent Indications:    R07.9* Chest pain, unspecified  History:        Patient has no prior history of Echocardiogram examinations.  Sonographer:    Eulah Pont RDCS Referring  Phys: [id number] Jonita Albee IMPRESSIONS  1. Left ventricular ejection fraction, by estimation, is 20 to 25%. The left ventricle has severely decreased function. The left ventricle demonstrates global hypokinesis. The left ventricular internal cavity size was severely dilated. Indeterminate diastolic filling due to E-A fusion.  2. Right ventricular systolic function is normal. The right ventricular size is normal. Tricuspid regurgitation signal is inadequate for assessing PA pressure.  3. Left atrial size was moderately dilated.  4. The mitral valve is normal in structure. Mild mitral valve regurgitation. No evidence of mitral stenosis.  5. The aortic valve is grossly normal. Aortic valve regurgitation is not visualized. No aortic stenosis is present.  6. Aortic dilatation noted. There is mild dilatation of the ascending aorta, measuring 41 mm.  7. The inferior vena cava is normal in size with greater than 50% respiratory variability, suggesting right atrial pressure of 3 mmHg. Comparison(s): No prior Echocardiogram. Conclusion(s)/Recommendation(s): Severely reduced LVEF with global hypokinesis. Findings reported to Dr. Chipper Herb and Robet Leu, PA. FINDINGS  Left Ventricle: Contrast shows very sluggish  flow at the LV apex without definitive thrombus. Left ventricular ejection fraction, by estimation, is 20 to 25%. The left ventricle has severely decreased function. The left ventricle demonstrates global hypokinesis. Definity contrast agent was given IV to delineate the left ventricular endocardial borders. The left ventricular internal cavity size was severely dilated. There is no left ventricular hypertrophy. Indeterminate diastolic filling due to E-A fusion. Right Ventricle: The right ventricular size is normal. Right vetricular wall thickness was not well visualized. Right ventricular systolic function is normal. Tricuspid regurgitation signal is inadequate for assessing PA pressure. Left Atrium: Left atrial size was moderately dilated. Right Atrium: Right atrial size was normal in size. Pericardium: There is no evidence of pericardial effusion. Mitral Valve: The mitral valve is normal in structure. Mild mitral valve regurgitation. No evidence of mitral valve stenosis. Tricuspid Valve: The tricuspid valve is normal in structure. Tricuspid valve regurgitation is trivial. No evidence of tricuspid stenosis. Aortic Valve: The aortic valve is grossly normal. Aortic valve regurgitation is not visualized. No aortic stenosis is present. Pulmonic Valve: The pulmonic valve was not well visualized. Pulmonic valve regurgitation is not visualized. No evidence of pulmonic stenosis. Aorta: Aortic dilatation noted. There is mild dilatation of the ascending aorta, measuring 41 mm. Venous: The inferior vena cava is normal in size with greater than 50% respiratory variability, suggesting right atrial pressure of 3 mmHg. IAS/Shunts: The atrial septum is grossly normal.  LEFT VENTRICLE PLAX 2D LVIDd:         7.00 cm LVIDs:         6.10 cm LV PW:         1.10 cm LV IVS:        1.20 cm LVOT diam:     2.40 cm      3D Volume EF: LV SV:         43           3D EF:        23 % LV SV Index:   17           LV EDV:       376 ml LVOT Area:      4.52 cm     LV ESV:       291 ml  LV SV:        85 ml  LV Volumes (MOD) LV vol d, MOD A2C: 256.0 ml LV vol d, MOD A4C: 219.0 ml LV vol s, MOD A2C: 185.0 ml LV vol s, MOD A4C: 160.0 ml LV SV MOD A2C:     71.0 ml LV SV MOD A4C:     219.0 ml LV SV MOD BP:      65.6 ml RIGHT VENTRICLE RV S prime:     9.12 cm/s TAPSE (M-mode): 1.8 cm LEFT ATRIUM             Index        RIGHT ATRIUM           Index LA diam:        5.00 cm 2.00 cm/m   RA Area:     16.00 cm LA Vol (A2C):   85.1 ml 34.11 ml/m  RA Volume:   46.10 ml  18.48 ml/m LA Vol (A4C):   78.5 ml 31.46 ml/m LA Biplane Vol: 86.3 ml 34.59 ml/m  AORTIC VALVE LVOT Vmax:   60.60 cm/s LVOT Vmean:  39.450 cm/s LVOT VTI:    0.094 m  AORTA Ao Root diam: 3.90 cm Ao Asc diam:  4.10 cm MR PISA:        0.57 cm MR PISA Radius: 0.30 cm   SHUNTS                           Systemic VTI:  0.09 m                           Systemic Diam: 2.40 cm Jodelle Red MD Electronically signed by Jodelle Red MD Signature Date/Time: 03/19/2021/3:45:32 PM    Final      Medications:     Scheduled Medications:  [MAR Hold] aspirin EC  81 mg Oral Daily   [MAR Hold] digoxin  0.125 mg Oral Daily   [MAR Hold] enoxaparin (LOVENOX) injection  60 mg Subcutaneous QHS   [MAR Hold] furosemide  40 mg Intravenous Daily   [MAR Hold] nicotine  21 mg Transdermal Daily   [MAR Hold] sodium chloride flush  3 mL Intravenous Q12H   sodium chloride flush  3 mL Intravenous Q12H   [MAR Hold] spironolactone  12.5 mg Oral Daily    Infusions:  [MAR Hold] sodium chloride     sodium chloride     sodium chloride 10 mL/hr at 03/20/21 0837    PRN Medications: [MAR Hold] sodium chloride, sodium chloride, [MAR Hold] acetaminophen, [MAR Hold] benzonatate, fentaNYL, Heparin (Porcine) in NaCl, heparin sodium (porcine), [MAR Hold] hydrALAZINE, iohexol, lidocaine (PF), midazolam, [MAR Hold] ondansetron (ZOFRAN) IV, Radial Cocktail/Verapamil only, [MAR Hold] sodium  chloride flush, sodium chloride flush   Assessment/Plan:    1. 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 - Plan cMRI - titrate GDMT - add colchicine   2. HTN - BP improved. Titrate GDMT  3. Hypokalemia - supp  4. Tobacco use - continue nicotine patch - smoking cessation discussed  Length of Stay: 1   Arvilla Meres MD  03/20/2021, 10:59 AM  Advanced Heart Failure Team Pager [phone] (M-F; 7a - 4p)  Please contact [facility] for night-coverage after hours (4p -7a ) and weekends on [email or web address]

## 2021-03-20 NOTE — Assessment & Plan Note (Signed)
With signs of fluid overload so patient received Lasix.  2D echocardiogram shows reduced LV function.  Continue to monitor renal function. ?

## 2021-03-21 ENCOUNTER — Other Ambulatory Visit (HOSPITAL_COMMUNITY): Payer: Self-pay

## 2021-03-21 LAB — CBC
HCT: 45.5 % (ref 39.0–52.0)
Hemoglobin: 15.7 g/dL (ref 13.0–17.0)
MCH: 30.3 pg (ref 26.0–34.0)
MCHC: 34.5 g/dL (ref 30.0–36.0)
MCV: 87.7 fL (ref 80.0–100.0)
Platelets: 242 10*3/uL (ref 150–400)
RBC: 5.19 MIL/uL (ref 4.22–5.81)
RDW: 13.5 % (ref 11.5–15.5)
WBC: 8.5 10*3/uL (ref 4.0–10.5)
nRBC: 0 % (ref 0.0–0.2)

## 2021-03-21 LAB — BASIC METABOLIC PANEL
Anion gap: 8 (ref 5–15)
BUN: 12 mg/dL (ref 6–20)
CO2: 22 mmol/L (ref 22–32)
Calcium: 8.6 mg/dL — ABNORMAL LOW (ref 8.9–10.3)
Chloride: 106 mmol/L (ref 98–111)
Creatinine, Ser: 1.44 mg/dL — ABNORMAL HIGH (ref 0.61–1.24)
GFR, Estimated: >60 mL/min (ref >60)
Glucose, Bld: 103 mg/dL — ABNORMAL HIGH (ref 70–99)
Potassium: 4 mmol/L (ref 3.5–5.1)
Sodium: 136 mmol/L (ref 135–145)

## 2021-03-21 LAB — HEMOGLOBIN A1C
Hgb A1c MFr Bld: 4.7 % — ABNORMAL LOW (ref 4.8–5.6)
Mean Plasma Glucose: 88.19 mg/dL

## 2021-03-21 LAB — COXSACKIE B VIRUS ANTIBODIES
Coxsackie B1 Ab: 1:16 {titer} — ABNORMAL HIGH
Coxsackie B2 Ab: NEGATIVE
Coxsackie B3 Ab: NEGATIVE
Coxsackie B4 Ab: NEGATIVE
Coxsackie B5 Ab: 1:8 {titer} — ABNORMAL HIGH
Coxsackie B6 Ab: 1:8 {titer} — ABNORMAL HIGH

## 2021-03-21 MED ORDER — SPIRONOLACTONE 25 MG PO TABS
12.5000 mg | ORAL_TABLET | Freq: Every day | ORAL | 1 refills | Status: DC
  Filled 2021-03-21: qty 15, 30d supply, fill #0

## 2021-03-21 MED ORDER — SACUBITRIL-VALSARTAN 49-51 MG PO TABS
1.0000 | ORAL_TABLET | Freq: Two times a day (BID) | ORAL | Status: DC
  Administered 2021-03-21 – 2021-03-22 (×2): 1 via ORAL
  Filled 2021-03-21 (×3): qty 1

## 2021-03-21 MED ORDER — SACUBITRIL-VALSARTAN 49-51 MG PO TABS
1.0000 | ORAL_TABLET | Freq: Two times a day (BID) | ORAL | 1 refills | Status: DC
  Filled 2021-03-21: qty 60, 30d supply, fill #0

## 2021-03-21 MED ORDER — DAPAGLIFLOZIN PROPANEDIOL 10 MG PO TABS
10.0000 mg | ORAL_TABLET | Freq: Every day | ORAL | 1 refills | Status: DC
  Filled 2021-03-21: qty 30, 30d supply, fill #0

## 2021-03-21 MED ORDER — SACUBITRIL-VALSARTAN 49-51 MG PO TABS: 1.0000 | ORAL_TABLET | Freq: Two times a day (BID) | ORAL | Status: DC

## 2021-03-21 MED ORDER — ASPIRIN 81 MG PO TBEC
81.0000 mg | DELAYED_RELEASE_TABLET | Freq: Every day | ORAL | 1 refills | Status: DC
  Filled 2021-03-21: qty 30, 30d supply, fill #0
  Filled 2021-06-17: qty 30, 30d supply, fill #1

## 2021-03-21 MED ORDER — CARVEDILOL 3.125 MG PO TABS
3.1250 mg | ORAL_TABLET | Freq: Two times a day (BID) | ORAL | 1 refills | Status: DC
  Filled 2021-03-21: qty 60, 30d supply, fill #0

## 2021-03-21 MED ORDER — COLCHICINE 0.6 MG PO TABS
0.3000 mg | ORAL_TABLET | Freq: Two times a day (BID) | ORAL | 1 refills | Status: DC
  Filled 2021-03-21: qty 30, 30d supply, fill #0
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
## 2021-03-21 NOTE — Progress Notes (Signed)
PROGRESS NOTE    Robert Daniels  ZOX:[id number] DOB: April 27, 1985 DOA: 03/19/2021 PCP: Pcp, No    Brief Narrative:  Robert Daniels is a 36 y.o. male with no significant past medical history presented to hospital with chest pain cough and shortness of breath for 2 to 3 days.  Patient did have a history of flulike symptoms in December 2022 when he was diagnosed of having right-sided pneumonia and was treated with 7-day course of antibiotic.  On discharge patient, patient was noted to be tachycardic tachypneic blood pressure was elevated and his pulse oxygen was around 89% on minimal activity.  Troponin was noted to be elevated at 8600> 8400> 8300. Chest x-ray showed cardiomegaly and pulmonary edema.  CT angiogram negative for PE.  Cardiology was consulted and patient was then considered for admission to hospital for further evaluation and treatment.     Assessment and Plan: * Myocarditis (HCC) Chest pain with elevated troponins.  Interesting.  Chest x-ray with cardiomegaly.  2D echocardiogram showed reduced LV function at 25% or so.  There is possibility of myocarditis as per cardiology but could not rule out acute coronary syndrome so  Cardiology recommended cardiac catheterization.  Patient was underwent cardiac catheterization on 03/20/2021 with normal coronaries but reduced LV function at 25%.  Cardiology on board.  Cardiology adjusting medications including Entresto, spironolactone, Coreg.  Continue aspirin.  Cardiac MRI has been ordered at this time.  HTN (hypertension) Has been started on Coreg and Entresto.  Blood pressure much better today.  Cigarette smoker Counseling done.  On nicotine patch.  AKI (acute kidney injury) (HCC) With signs of fluid overload so patient received Lasix.  2D echocardiogram shows reduced LV function.  Continue to monitor renal function.  Morbid obesity with BMI of 40.0-44.9, adult [facility]) Patient would benefit from weight loss as outpatient.  CHF (congestive heart  failure) (HCC) Acute systolic CHF.  Continue aspirin, Coreg, Entresto and Aldactone.   Cardiology on board.  Strict intake and output charting.  Daily weights.    DVT prophylaxis:   Lovenox   Code Status:     Code Status: Full Code  Disposition: Home when okay with cardiology.  Status is: Inpatient  Remains inpatient appropriate because: Myocarditis, cardiomyopathy, cardiac MRI, cardiology adjusting medication..   Family Communication: Spoke with the patient's grandmother at bedside  Consultants:  Cardiology  Procedures:  Cardiac catheterization on 03/20/2021.  Antimicrobials:  None  Anti-infectives (From admission, onward)    Start     Dose/Rate Route Frequency Ordered Stop   03/19/21 0845  doxycycline (VIBRA-TABS) tablet 100 mg        100 mg Oral  Once 03/19/21 0837 03/19/21 0858       Subjective: Today, patient was seen and examined at bedside.  Patient denies any chest pain.  No nausea vomiting shortness of breath.  Patient's family at bedside.  Objective: Vitals:   03/20/21 1111 03/20/21 1754 03/20/21 2039 03/21/21 0536  BP: (!) 143/108 (!) 139/101 (!) 126/100 (!) 114/94  Pulse: 86  85 94  Resp: (!) Temp: (!) 97.5 F (36.4 C)  98.8 F (37.1 C) 98.5 F (36.9 C)  TempSrc: Oral  Oral Oral  SpO2: 97%  97% 99%  Weight:    127.9 kg  Height:        Intake/Output Summary (Last 24 hours) at 03/21/2021 1236 Last data filed at 03/21/2021 0539 Gross per 24 hour  Intake 420 ml  Output 100 ml  Net 320  ml   Filed Weights   03/19/21 0655 03/20/21 0453 03/21/21 0536  Weight: 131.5 kg 128.4 kg 127.9 kg   Body mass index is 38.24 kg/m.   Physical Examination:  General: Obese built, not in obvious distress HENT:   No scleral pallor or icterus noted. Oral mucosa is moist.  Chest:  Clear breath sounds.  Diminished breath sounds bilaterally. No crackles or wheezes.  CVS: S1 &S2 heard. No murmur.  Regular rate and rhythm. Abdomen: Soft, nontender,  nondistended.  Bowel sounds are heard.   Extremities: No cyanosis, clubbing or edema.  Peripheral pulses are palpable. Psych: Alert, awake and oriented, normal mood CNS:  No cranial nerve deficits.  Power equal in all extremities.   Skin: Warm and dry.  No rashes noted.   Data Reviewed:   CBC: Recent Labs  Lab 03/19/21 0712 03/20/21 0237 03/20/21 1040 03/20/21 1044 03/20/21 1045 03/21/21 0221  WBC 11.0* 9.4  --   --   --  8.5  NEUTROABS 7.5  --   --   --   --   --   HGB 15.0 15.2 13.6 14.6 13.9 15.7  HCT 44.7 45.1 40.0 43.0 41.0 45.5  MCV 89.4 87.7  --   --   --  87.7  PLT 239 234  --   --   --  242    Basic Metabolic Panel: Recent Labs  Lab 03/19/21 0712 03/19/21 1225 03/20/21 0237 03/20/21 1040 03/20/21 1044 03/20/21 1045 03/21/21 0221  NA 139  --  139 144 142 145 136  K 4.1  --  3.3* 3.0* 3.3* 3.0* 4.0  CL 106  --  106  --   --   --  106  CO2 25  --  22  --   --   --  22  GLUCOSE 107*  --  75  --   --   --  103*  BUN 12  --  9  --   --   --  12  CREATININE 1.55*  --  1.42*  --   --   --  1.44*  CALCIUM 8.7*  --  9.0  --   --   --  8.6*  MG  --  1.8  --   --   --   --   --     Liver Function Tests: No results for input(s): AST, ALT, ALKPHOS, BILITOT, PROT, ALBUMIN in the last 168 hours.   Radiology Studies: CARDIAC CATHETERIZATION  Result Date: 03/20/2021 Findings: RA = 3 RV = 32/10 PA = 34/20 (26) PCW = 17 Fick cardiac output/index = 6.1/2.5 PVR = 1.5 WU Ao sat = 94% PA sat = 68%, 68% Assessment: 1. NICM  with EF 25%. Suspect myocarditis 2. Well-compensated filling pressures Plan/Discussion: Check cMRI. Titrate GDMT. Arvilla Meres, MD 11:06 AM  US RENAL  Result Date: 03/19/2021 CLINICAL DATA:  Acute renal insufficiency. EXAM: RENAL / URINARY TRACT ULTRASOUND COMPLETE COMPARISON:  None. FINDINGS: Right Kidney: Renal measurements: 11.2 x 6.3 x 5.1 cm = volume: 187.9 mL. Normal renal cortical thickness and echogenicity without focal lesions or  hydronephrosis. Left Kidney: Renal measurements: 10.7 x 6.1 x 5.1 cm = volume: 172.0 mL. Normal renal cortical thickness and echogenicity without focal lesions or hydronephrosis. Bladder: Appears normal for degree of bladder distention. Other: None. IMPRESSION: Normal renal ultrasound examination. Electronically Signed   By: Rudie Meyer M.D.   On: 03/19/2021 18:11   DG CHEST PORT 1 VIEW  Result  Date: 03/20/2021 CLINICAL DATA:  CHF EXAM: PORTABLE CHEST 1 VIEW COMPARISON:  Yesterday CT and radiography from yesterday FINDINGS: Cardiomegaly. Stable aortic and hilar contours. There is no edema, consolidation, effusion, or pneumothorax. Artifact from EKG leads. IMPRESSION: Improved pulmonary edema. Electronically Signed   By: Tiburcio Pea M.D.   On: 03/20/2021 06:37   ECHOCARDIOGRAM COMPLETE  Result Date: 03/19/2021    ECHOCARDIOGRAM REPORT   Patient Name:   Robert Daniels Date of Exam: 03/19/2021 Medical Rec #:  [id number]        Height:       72.0 in Accession #:    [id number]       Weight:       290.0 lb Date of Birth:  Aug 18, 1985        BSA:          2.495 m Patient Age:    35 years         BP:           129/81 mmHg Patient Gender: M                HR:           94 bpm. Exam Location:  Inpatient Procedure: 2D Echo, Cardiac Doppler, Color Doppler and Intracardiac            Opacification Agent Indications:    R07.9* Chest pain, unspecified  History:        Patient has no prior history of Echocardiogram examinations.  Sonographer:    Eulah Pont RDCS Referring Phys: [id number] Jonita Albee IMPRESSIONS  1. Left ventricular ejection fraction, by estimation, is 20 to 25%. The left ventricle has severely decreased function. The left ventricle demonstrates global hypokinesis. The left ventricular internal cavity size was severely dilated. Indeterminate diastolic filling due to E-A fusion.  2. Right ventricular systolic function is normal. The right ventricular size is normal. Tricuspid regurgitation signal is  inadequate for assessing PA pressure.  3. Left atrial size was moderately dilated.  4. The mitral valve is normal in structure. Mild mitral valve regurgitation. No evidence of mitral stenosis.  5. The aortic valve is grossly normal. Aortic valve regurgitation is not visualized. No aortic stenosis is present.  6. Aortic dilatation noted. There is mild dilatation of the ascending aorta, measuring 41 mm.  7. The inferior vena cava is normal in size with greater than 50% respiratory variability, suggesting right atrial pressure of 3 mmHg. Comparison(s): No prior Echocardiogram. Conclusion(s)/Recommendation(s): Severely reduced LVEF with global hypokinesis. Findings reported to Dr. Chipper Herb and Robet Leu, PA. FINDINGS  Left Ventricle: Contrast shows very sluggish flow at the LV apex without definitive thrombus. Left ventricular ejection fraction, by estimation, is 20 to 25%. The left ventricle has severely decreased function. The left ventricle demonstrates global hypokinesis. Definity contrast agent was given IV to delineate the left ventricular endocardial borders. The left ventricular internal cavity size was severely dilated. There is no left ventricular hypertrophy. Indeterminate diastolic filling due to E-A fusion. Right Ventricle: The right ventricular size is normal. Right vetricular wall thickness was not well visualized. Right ventricular systolic function is normal. Tricuspid regurgitation signal is inadequate for assessing PA pressure. Left Atrium: Left atrial size was moderately dilated. Right Atrium: Right atrial size was normal in size. Pericardium: There is no evidence of pericardial effusion. Mitral Valve: The mitral valve is normal in structure. Mild mitral valve regurgitation. No evidence of mitral valve stenosis. Tricuspid Valve: The tricuspid valve is normal in structure.  Tricuspid valve regurgitation is trivial. No evidence of tricuspid stenosis. Aortic Valve: The aortic valve is grossly normal.  Aortic valve regurgitation is not visualized. No aortic stenosis is present. Pulmonic Valve: The pulmonic valve was not well visualized. Pulmonic valve regurgitation is not visualized. No evidence of pulmonic stenosis. Aorta: Aortic dilatation noted. There is mild dilatation of the ascending aorta, measuring 41 mm. Venous: The inferior vena cava is normal in size with greater than 50% respiratory variability, suggesting right atrial pressure of 3 mmHg. IAS/Shunts: The atrial septum is grossly normal.  LEFT VENTRICLE PLAX 2D LVIDd:         7.00 cm LVIDs:         6.10 cm LV PW:         1.10 cm LV IVS:        1.20 cm LVOT diam:     2.40 cm      3D Volume EF: LV SV:         43           3D EF:        23 % LV SV Index:   17           LV EDV:       376 ml LVOT Area:     4.52 cm     LV ESV:       291 ml                             LV SV:        85 ml  LV Volumes (MOD) LV vol d, MOD A2C: 256.0 ml LV vol d, MOD A4C: 219.0 ml LV vol s, MOD A2C: 185.0 ml LV vol s, MOD A4C: 160.0 ml LV SV MOD A2C:     71.0 ml LV SV MOD A4C:     219.0 ml LV SV MOD BP:      65.6 ml RIGHT VENTRICLE RV S prime:     9.12 cm/s TAPSE (M-mode): 1.8 cm LEFT ATRIUM             Index        RIGHT ATRIUM           Index LA diam:        5.00 cm 2.00 cm/m   RA Area:     16.00 cm LA Vol (A2C):   85.1 ml 34.11 ml/m  RA Volume:   46.10 ml  18.48 ml/m LA Vol (A4C):   78.5 ml 31.46 ml/m LA Biplane Vol: 86.3 ml 34.59 ml/m  AORTIC VALVE LVOT Vmax:   60.60 cm/s LVOT Vmean:  39.450 cm/s LVOT VTI:    0.094 m  AORTA Ao Root diam: 3.90 cm Ao Asc diam:  4.10 cm MR PISA:        0.57 cm MR PISA Radius: 0.30 cm   SHUNTS                           Systemic VTI:  0.09 m                           Systemic Diam: 2.40 cm Jodelle Red MD Electronically signed by Jodelle Red MD Signature Date/Time: 03/19/2021/3:45:32 PM    Final       LOS: 2 days    Joycelyn Das, MD [facility] 03/21/2021, 12:36  7:24 PM

## 2021-03-21 NOTE — TOC Progression Note (Signed)
Transition of Care (TOC) - Progression Note  ? ? ?Patient Details  ?Name: Robert Daniels ?MRN: [id number] ?Date of Birth: [birth year] ? ?Transition of Care (TOC) CM/SW Contact  ?Wynonna Fitzhenry, LCSW ?Phone Number: ?03/21/2021, 1:54 PM ? ?Clinical Narrative:    ?[facility] is following for help with insurance coverage. ? ?CSW will continue to follow throughout discharge. ? ? ?Expected Discharge Plan: Home/Self Care ?Barriers to Discharge: Continued Medical Work up ? ?Expected Discharge Plan and Services ?Expected Discharge Plan: Home/Self Care ?  ?  ?  ?Living arrangements for the past 2 months: Single Family Home ?                ?  ?  ?  ?  ?  ?  ?  ?  ?  ?  ? ? ?Social Determinants of Health (SDOH) Interventions ?  ? ?Readmission Risk Interventions ?No flowsheet data found. ? ?Lucas Winograd, MSW, LCSW ?[phone] ?Heart Failure Social Worker  ?

## 2021-03-21 NOTE — Progress Notes (Addendum)
Advanced Heart Failure Rounding Note   Subjective:      Feeling okay today. No dyspnea or CP.  BP remains elevated.   Cath -normal cors EF 25%   Findings:  RA = 3 RV = 32/10 PA = 34/20 (26) PCW = 17 Fick cardiac output/index = 6.1/2.5 PVR = 1.5 WU Ao sat = 94% PA sat = 68%, 68%   Objective:    Vital Signs:   Temp:  [98.5 F (36.9 C)-98.8 F (37.1 C)] 98.5 F (36.9 C) (03/10 0536) Pulse Rate:  [85-94] 94 (03/10 0536) Resp:  [19-20] 19 (03/10 0536) BP: (114-139)/(94-101) 114/94 (03/10 0536) SpO2:  [97 %-99 %] 99 % (03/10 0536) Weight:  [127.9 kg] 127.9 kg (03/10 0536) Last BM Date : 03/20/21  Weight change: Filed Weights   03/19/21 0655 03/20/21 0453 03/21/21 0536  Weight: 131.5 kg 128.4 kg 127.9 kg    Intake/Output:   Intake/Output Summary (Last 24 hours) at 03/21/2021 1208 Last data filed at 03/21/2021 0539 Gross per 24 hour  Intake 420 ml  Output 100 ml  Net 320 ml     Physical Exam: General:  No distress. Lying comfortably in bed. HEENT: normal Neck: supple. no JVD. Carotids 2+ bilat; no bruits.  Cor: PMI nondisplaced. Regular rate & rhythm. No rubs, gallops or murmurs. Lungs: clear Abdomen: soft, nontender, nondistended. No hepatosplenomegaly.  Extremities: no cyanosis, clubbing, rash, edema Neuro: alert & orientedx3, cranial nerves grossly intact. moves all 4 extremities w/o difficulty. Affect pleasant   Telemetry: SR 80s-90s   Labs: Basic Metabolic Panel: Recent Labs  Lab 03/19/21 0712 03/19/21 1225 03/20/21 0237 03/20/21 1040 03/20/21 1044 03/20/21 1045 03/21/21 0221  NA 139  --  139 144 142 145 136  K 4.1  --  3.3* 3.0* 3.3* 3.0* 4.0  CL 106  --  106  --   --   --  106  CO2 25  --  22  --   --   --  22  GLUCOSE 107*  --  75  --   --   --  103*  BUN 12  --  9  --   --   --  12  CREATININE 1.55*  --  1.42*  --   --   --  1.44*  CALCIUM 8.7*  --  9.0  --   --   --  8.6*  MG  --  1.8  --   --   --   --   --     Liver  Function Tests: No results for input(s): AST, ALT, ALKPHOS, BILITOT, PROT, ALBUMIN in the last 168 hours. No results for input(s): LIPASE, AMYLASE in the last 168 hours. No results for input(s): AMMONIA in the last 168 hours.  CBC: Recent Labs  Lab 03/19/21 0712 03/20/21 0237 03/20/21 1040 03/20/21 1044 03/20/21 1045 03/21/21 0221  WBC 11.0* 9.4  --   --   --  8.5  NEUTROABS 7.5  --   --   --   --   --   HGB 15.0 15.2 13.6 14.6 13.9 15.7  HCT 44.7 45.1 40.0 43.0 41.0 45.5  MCV 89.4 87.7  --   --   --  87.7  PLT 239 234  --   --   --  242    Cardiac Enzymes: Recent Labs  Lab 03/19/21 1521  CKTOTAL 363    BNP: BNP (last 3 results) Recent Labs    03/19/21 [id number]  BNP 417.7*    ProBNP (last 3 results) No results for input(s): PROBNP in the last 8760 hours.    Other results:  Imaging: CT Angio Chest PE W and/or Wo Contrast  Result Date: 03/19/2021 CLINICAL DATA:  Concern for pulmonary embolism. Acute onset chest pain. Elevated troponin. Elevated D-dimer. EXAM: CT ANGIOGRAPHY CHEST WITH CONTRAST TECHNIQUE: Multidetector CT imaging of the chest was performed using the standard protocol during bolus administration of intravenous contrast. Multiplanar CT image reconstructions and MIPs were obtained to evaluate the vascular anatomy. RADIATION DOSE REDUCTION: This exam was performed according to the departmental dose-optimization program which includes automated exposure control, adjustment of the mA and/or kV according to patient size and/or use of iterative reconstruction technique. CONTRAST:  OMNIPAQUE IOHEXOL 350 MG/ML SOLN COMPARISON:  None. FINDINGS: Cardiovascular: No filling defects within the pulmonary arteries to suggest acute pulmonary embolism. No pericardial effusion. LEFT heart appears enlarged. Mediastinum/Nodes: No axillary or supraclavicular adenopathy. No mediastinal or hilar adenopathy. No pericardial fluid. Esophagus normal. Lungs/Pleura: There is nodular  airspace disease diffusely with a lower lobe predominance. Mild interlobular septal thickening in the upper lobes. No pleural effusion. Upper Abdomen: Limited view of the liver, kidneys, pancreas are unremarkable. Normal adrenal glands. Musculoskeletal: No aggressive osseous lesion. Review of the MIP images confirms the above findings. IMPRESSION: 1. No evidence acute pulmonary embolism. 2. Fine nodular airspace disease diffusely in the lower lobes suggest pulmonary edema. 3. LEFT heart appears enlarged. 4. No pericardial effusion. Findings conveyed toCHRISTOPHER TEGELER on 03/19/2021  at12:28. Electronically Signed   By: Genevive Bi M.D.   On: 03/19/2021 12:30   CARDIAC CATHETERIZATION  Result Date: 03/20/2021 Findings: RA = 3 RV = 32/10 PA = 34/20 (26) PCW = 17 Fick cardiac output/index = 6.1/2.5 PVR = 1.5 WU Ao sat = 94% PA sat = 68%, 68% Assessment: 1. NICM  with EF 25%. Suspect myocarditis 2. Well-compensated filling pressures Plan/Discussion: Check cMRI. Titrate GDMT. Arvilla Meres, MD 11:06 AM  US RENAL  Result Date: 03/19/2021 CLINICAL DATA:  Acute renal insufficiency. EXAM: RENAL / URINARY TRACT ULTRASOUND COMPLETE COMPARISON:  None. FINDINGS: Right Kidney: Renal measurements: 11.2 x 6.3 x 5.1 cm = volume: 187.9 mL. Normal renal cortical thickness and echogenicity without focal lesions or hydronephrosis. Left Kidney: Renal measurements: 10.7 x 6.1 x 5.1 cm = volume: 172.0 mL. Normal renal cortical thickness and echogenicity without focal lesions or hydronephrosis. Bladder: Appears normal for degree of bladder distention. Other: None. IMPRESSION: Normal renal ultrasound examination. Electronically Signed   By: Rudie Meyer M.D.   On: 03/19/2021 18:11   DG CHEST PORT 1 VIEW  Result Date: 03/20/2021 CLINICAL DATA:  CHF EXAM: PORTABLE CHEST 1 VIEW COMPARISON:  Yesterday CT and radiography from yesterday FINDINGS: Cardiomegaly. Stable aortic and hilar contours. There is no edema, consolidation,  effusion, or pneumothorax. Artifact from EKG leads. IMPRESSION: Improved pulmonary edema. Electronically Signed   By: Tiburcio Pea M.D.   On: 03/20/2021 06:37   ECHOCARDIOGRAM COMPLETE  Result Date: 03/19/2021    ECHOCARDIOGRAM REPORT   Patient Name:   Robert Daniels Date of Exam: 03/19/2021 Medical Rec #:  [id number]        Height:       72.0 in Accession #:    [id number]       Weight:       290.0 lb Date of Birth:  Jun 21, 1985        BSA:          2.495 m  Patient Age:    35 years         BP:           129/81 mmHg Patient Gender: M                HR:           94 bpm. Exam Location:  Inpatient Procedure: 2D Echo, Cardiac Doppler, Color Doppler and Intracardiac            Opacification Agent Indications:    R07.9* Chest pain, unspecified  History:        Patient has no prior history of Echocardiogram examinations.  Sonographer:    Eulah Pont RDCS Referring Phys: [id number] Jonita Albee IMPRESSIONS  1. Left ventricular ejection fraction, by estimation, is 20 to 25%. The left ventricle has severely decreased function. The left ventricle demonstrates global hypokinesis. The left ventricular internal cavity size was severely dilated. Indeterminate diastolic filling due to E-A fusion.  2. Right ventricular systolic function is normal. The right ventricular size is normal. Tricuspid regurgitation signal is inadequate for assessing PA pressure.  3. Left atrial size was moderately dilated.  4. The mitral valve is normal in structure. Mild mitral valve regurgitation. No evidence of mitral stenosis.  5. The aortic valve is grossly normal. Aortic valve regurgitation is not visualized. No aortic stenosis is present.  6. Aortic dilatation noted. There is mild dilatation of the ascending aorta, measuring 41 mm.  7. The inferior vena cava is normal in size with greater than 50% respiratory variability, suggesting right atrial pressure of 3 mmHg. Comparison(s): No prior Echocardiogram. Conclusion(s)/Recommendation(s):  Severely reduced LVEF with global hypokinesis. Findings reported to Dr. Chipper Herb and Robet Leu, PA. FINDINGS  Left Ventricle: Contrast shows very sluggish flow at the LV apex without definitive thrombus. Left ventricular ejection fraction, by estimation, is 20 to 25%. The left ventricle has severely decreased function. The left ventricle demonstrates global hypokinesis. Definity contrast agent was given IV to delineate the left ventricular endocardial borders. The left ventricular internal cavity size was severely dilated. There is no left ventricular hypertrophy. Indeterminate diastolic filling due to E-A fusion. Right Ventricle: The right ventricular size is normal. Right vetricular wall thickness was not well visualized. Right ventricular systolic function is normal. Tricuspid regurgitation signal is inadequate for assessing PA pressure. Left Atrium: Left atrial size was moderately dilated. Right Atrium: Right atrial size was normal in size. Pericardium: There is no evidence of pericardial effusion. Mitral Valve: The mitral valve is normal in structure. Mild mitral valve regurgitation. No evidence of mitral valve stenosis. Tricuspid Valve: The tricuspid valve is normal in structure. Tricuspid valve regurgitation is trivial. No evidence of tricuspid stenosis. Aortic Valve: The aortic valve is grossly normal. Aortic valve regurgitation is not visualized. No aortic stenosis is present. Pulmonic Valve: The pulmonic valve was not well visualized. Pulmonic valve regurgitation is not visualized. No evidence of pulmonic stenosis. Aorta: Aortic dilatation noted. There is mild dilatation of the ascending aorta, measuring 41 mm. Venous: The inferior vena cava is normal in size with greater than 50% respiratory variability, suggesting right atrial pressure of 3 mmHg. IAS/Shunts: The atrial septum is grossly normal.  LEFT VENTRICLE PLAX 2D LVIDd:         7.00 cm LVIDs:         6.10 cm LV PW:         1.10 cm LV IVS:         1.20 cm LVOT diam:  2.40 cm      3D Volume EF: LV SV:         43           3D EF:        23 % LV SV Index:   17           LV EDV:       376 ml LVOT Area:     4.52 cm     LV ESV:       291 ml                             LV SV:        85 ml  LV Volumes (MOD) LV vol d, MOD A2C: 256.0 ml LV vol d, MOD A4C: 219.0 ml LV vol s, MOD A2C: 185.0 ml LV vol s, MOD A4C: 160.0 ml LV SV MOD A2C:     71.0 ml LV SV MOD A4C:     219.0 ml LV SV MOD BP:      65.6 ml RIGHT VENTRICLE RV S prime:     9.12 cm/s TAPSE (M-mode): 1.8 cm LEFT ATRIUM             Index        RIGHT ATRIUM           Index LA diam:        5.00 cm 2.00 cm/m   RA Area:     16.00 cm LA Vol (A2C):   85.1 ml 34.11 ml/m  RA Volume:   46.10 ml  18.48 ml/m LA Vol (A4C):   78.5 ml 31.46 ml/m LA Biplane Vol: 86.3 ml 34.59 ml/m  AORTIC VALVE LVOT Vmax:   60.60 cm/s LVOT Vmean:  39.450 cm/s LVOT VTI:    0.094 m  AORTA Ao Root diam: 3.90 cm Ao Asc diam:  4.10 cm MR PISA:        0.57 cm MR PISA Radius: 0.30 cm   SHUNTS                           Systemic VTI:  0.09 m                           Systemic Diam: 2.40 cm Buford Dresser MD Electronically signed by Buford Dresser MD Signature Date/Time: 03/19/2021/3:45:32 PM    Final      Medications:     Scheduled Medications:  aspirin EC  81 mg Oral Daily   carvedilol  3.125 mg Oral BID WC   colchicine  0.3 mg Oral BID   enoxaparin (LOVENOX) injection  60 mg Subcutaneous QHS   nicotine  21 mg Transdermal Daily   sacubitril-valsartan  1 tablet Oral BID   sodium chloride flush  3 mL Intravenous Q12H   sodium chloride flush  3 mL Intravenous Q12H   spironolactone  12.5 mg Oral Daily    Infusions:  sodium chloride     sodium chloride      PRN Medications: sodium chloride, sodium chloride, acetaminophen, benzonatate, ondansetron (ZOFRAN) IV, sodium chloride flush, sodium chloride flush   Assessment/Plan:    1. 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 - Awaiting cMRI  - Continue colchicine  0.2 mg BID - Continue coreg 3.125 mg BID - Continue spiro 12.5 mg daily - Increase entresto to 24/26 mg BID - Start Farxiga 10 mg daily tomorrow   2. HTN - BP improving but remains elevated. Titrate GDMT  3. Hypokalemia - supp K as needed - 4.0 today.  4. Tobacco use - continue nicotine patch - smoking cessation discussed  Will send HF medications to [place].  Will arrange for f/u in HF clinic.  Length of Stay: 2   FINCH, LINDSAY N PA-C 03/21/2021, 12:08 PM  Advanced Heart Failure Team Pager [phone] (M-F; 7a - 4p)  Please contact [facility] for night-coverage after hours (4p -7a ) and weekends on [email or web address]   Patient seen and examined with the above-signed Advanced Practice Provider and/or Housestaff. I personally reviewed laboratory data, imaging studies and relevant notes. I independently examined the patient and formulated the important aspects of the plan. I have edited the note to reflect any of my changes or salient points. I have personally discussed the plan with the patient and/or family.  Feels good. No CP or SOB. BP improved  General:  Well appearing. No resp difficulty HEENT: normal Neck: supple. no JVD. Carotids 2+ bilat; no bruits. No lymphadenopathy or thryomegaly appreciated. Cor: PMI nondisplaced. Regular rate & rhythm. No rubs, gallops or murmurs. Lungs: clear Abdomen: soft, nontender, nondistended. No hepatosplenomegaly. No bruits or masses. Good bowel sounds. Extremities: no cyanosis, clubbing, rash, edema Neuro: alert & orientedx3, cranial nerves grossly intact. moves all 4 extremities w/o difficulty. Affect pleasant  Cath results reviewed with him. He has NICM. Suspect myocarditis. cMRI complete- results pending. Continue to titrate GDMT. Likely home in am.   Glori Bickers, MD  7:12 PM

## 2021-03-22 LAB — CBC
HCT: 47.7 % (ref 39.0–52.0)
Hemoglobin: 16 g/dL (ref 13.0–17.0)
MCH: 30 pg (ref 26.0–34.0)
MCHC: 33.5 g/dL (ref 30.0–36.0)
MCV: 89.3 fL (ref 80.0–100.0)
Platelets: 241 10*3/uL (ref 150–400)
RBC: 5.34 MIL/uL (ref 4.22–5.81)
RDW: 13.4 % (ref 11.5–15.5)
WBC: 8.9 10*3/uL (ref 4.0–10.5)
nRBC: 0 % (ref 0.0–0.2)

## 2021-03-22 LAB — BASIC METABOLIC PANEL
Anion gap: 7 (ref 5–15)
BUN: 9 mg/dL (ref 6–20)
CO2: 25 mmol/L (ref 22–32)
Calcium: 8.7 mg/dL — ABNORMAL LOW (ref 8.9–10.3)
Chloride: 107 mmol/L (ref 98–111)
Creatinine, Ser: 1.4 mg/dL — ABNORMAL HIGH (ref 0.61–1.24)
GFR, Estimated: >60 mL/min (ref >60)
Glucose, Bld: 101 mg/dL — ABNORMAL HIGH (ref 70–99)
Potassium: 4 mmol/L (ref 3.5–5.1)
Sodium: 139 mmol/L (ref 135–145)

## 2021-03-22 NOTE — Progress Notes (Addendum)
? ? ?Advanced Heart Failure Rounding Note ? ? ?Subjective:   ? ?Feels ok. Denies CP or SOB.  ? ?Eager to go home ? ?cMRI EF 15% with markedly dilated LV with evidence of diffuse myopericarditis. RV 33% ? ? ?Cath ?-normal cors EF 25%  ? ?Findings: ? ?RA = 3 ?RV = 32/10 ?PA = 34/20 (26) ?PCW = 17 ?Fick cardiac output/index = 6.1/2.5 ?PVR = 1.5 WU ?Ao sat = 94% ?PA sat = 68%, 68% ? ? ?Objective:   ? ?Vital Signs:   ?Temp:  [98.3 ?F (36.8 ?C)-98.5 ?F (36.9 ?C)] 98.3 ?F (36.8 ?C) (03/11 0518) ?Pulse Rate:  [83-91] 83 (03/11 0518) ?Resp:  [13-18] 18 (03/11 0518) ?BP: (105-130)/(80-95) 122/95 (03/11 0518) ?SpO2:  [98 %-100 %] 98 % (03/11 0518) ?Weight:  [128.1 kg] 128.1 kg (03/11 0518) ?Last BM Date : 03/20/21 ? ?Weight change: ?Filed Weights  ? 03/20/21 0453 03/21/21 0536 03/22/21 0518  ?Weight: 128.4 kg 127.9 kg 128.1 kg  ? ? ?Intake/Output:  ? ?Intake/Output Summary (Last 24 hours) at 03/22/2021 0839 ?Last data filed at 03/21/2021 2130 ?Gross per 24 hour  ?Intake 240 ml  ?Output 150 ml  ?Net 90 ml  ? ?  ? ?Physical Exam: ?General:  Well appearing. No resp difficulty ?HEENT: normal ?Neck: supple. no JVD. Carotids 2+ bilat; no bruits. No lymphadenopathy or thryomegaly appreciated. ?Cor: PMI nondisplaced. Regular rate & rhythm. No rubs, gallops or murmurs. ?Lungs: clear ?Abdomen: soft, nontender, nondistended. No hepatosplenomegaly. No bruits or masses. Good bowel sounds. ?Extremities: no cyanosis, clubbing, rash, edema ?Neuro: alert & orientedx3, cranial nerves grossly intact. moves all 4 extremities w/o difficulty. Affect pleasant ? ?Telemetry: SR 80-90s Personally reviewed ? ? ?Labs: ?Basic Metabolic Panel: ?Recent Labs  ?Lab 03/19/21 ?0712 03/19/21 ?1225 03/20/21 ?0237 03/20/21 ?1040 03/20/21 ?1044 03/20/21 ?1045 03/21/21 ?0221 03/22/21 ?0209  ?NA 139  --  139 144 142 145 136 139  ?K 4.1  --  3.3* 3.0* 3.3* 3.0* 4.0 4.0  ?CL 106  --  106  --   --   --  106 107  ?CO2 25  --  22  --   --   --  22 25  ?GLUCOSE 107*  --  75   --   --   --  103* 101*  ?BUN 12  --  9  --   --   --  12 9  ?CREATININE 1.55*  --  1.42*  --   --   --  1.44* 1.40*  ?CALCIUM 8.7*  --  9.0  --   --   --  8.6* 8.7*  ?MG  --  1.8  --   --   --   --   --   --   ? ? ? ?Liver Function Tests: ?No results for input(s): AST, ALT, ALKPHOS, BILITOT, PROT, ALBUMIN in the last 168 hours. ?No results for input(s): LIPASE, AMYLASE in the last 168 hours. ?No results for input(s): AMMONIA in the last 168 hours. ? ?CBC: ?Recent Labs  ?Lab 03/19/21 ?0712 03/20/21 ?0237 03/20/21 ?1040 03/20/21 ?1044 03/20/21 ?1045 03/21/21 ?0221 03/22/21 ?0209  ?WBC 11.0* 9.4  --   --   --  8.5 8.9  ?NEUTROABS 7.5  --   --   --   --   --   --   ?HGB 15.0 15.2 13.6 14.6 13.9 15.7 16.0  ?HCT 44.7 45.1 40.0 43.0 41.0 45.5 47.7  ?MCV 89.4 87.7  --   --   --  87.7 89.3  ?PLT 239 234  --   --   --  242 241  ? ? ? ?Cardiac Enzymes: ?Recent Labs  ?Lab 03/19/21 ?1521  ?CKTOTAL 363  ? ? ? ?BNP: ?BNP (last 3 results) ?Recent Labs  ?  03/19/21 ?0712  ?BNP 417.7*  ? ? ? ?ProBNP (last 3 results) ?No results for input(s): PROBNP in the last 8760 hours. ? ? ? ?Other results: ? ?Imaging: ?CARDIAC CATHETERIZATION ? ?Result Date: 03/20/2021 ?Findings: RA = 3 RV = 32/10 PA = 34/20 (26) PCW = 17 Fick cardiac output/index = 6.1/2.5 PVR = 1.5 WU Ao sat = 94% PA sat = 68%, 68% Assessment: 1. NICM  with EF 25%. Suspect myocarditis 2. Well-compensated filling pressures Plan/Discussion: Check cMRI. Titrate GDMT. Glori Bickers, MD 11:06 AM ? ?MR CARDIAC MORPHOLOGY W WO CONTRAST ? ?Result Date: 03/21/2021 ?CLINICAL DATA:  Clinical question of myopericarditis Study assumes BSA of 2.55 m2. EXAM: CARDIAC MRI TECHNIQUE: The patient was scanned on a 1.5 Tesla GE magnet. A dedicated cardiac coil was used. Functional imaging was done using Fiesta sequences. 2,3, and 4 chamber views were done to assess for RWMA's. Modified Simpson's rule using a short axis stack was used to calculate an ejection fraction on a dedicated work Ecologist. The patient received 10 cc of Gadavist. After 10 minutes inversion recovery sequences were used to assess for infiltration and scar tissue. CONTRAST:  10 cc  of Gadavist FINDINGS: 1. Severe left ventricular dilation, with LVEDD 80 mm, and LVEDVi 170 mL/m2. Mild asymmetric remodeling, with intraventricular septal thickness of 12 mm, posterior wall thickness of 7 mm, but myocardial mass index of 86 g/m2. Severe left ventricular systolic dysfunction (LVEF =16%). Global hypokinesis worse in the basal septum and inferior and worse in the apex (all segments). GLS -4%. There is no evidence of LV thrombus. Left ventricular parametric mapping notable for increased T2 Stir signal basal inferior, mid inferior and inferolateral, anterolateral, and anterior, and apical (all segments). Highest T2 signal 70 ms. Increase in native T1 signal in the inferior wall, highest inferior apex 1200 Ms. There is late gadolinium enhancement in the left ventricular myocardium: There is a apical-mid, inferior, mid wall LGE stripe. 2.  Mild increase in right ventricular size with RVEDVI 97 mL/m2. Normal right ventricular thickness. Moderately reduced right ventricular systolic function (RVEF =97%). Septal hypokinesis. 3.  Normal left and right atrial size. 4. Normal size of the aortic root, ascending aorta and pulmonary artery. 5. Valve assessment: Aortic Valve:Tri-leaflet aortic valve. Qualitatively no significant regurgitation. Pulmonic Valve: Qualitatively no significant regurgitation. Tricuspid Valve: Qualitatively no significant regurgitation. Mitral Valve: Mild central regurgitation, regurgitant fraction 16%. 6. Normal pericardium. No pericardial thickening. No pericardial effusion. There is slight pericardial enhancement over the inferior RV. 7. Grossly, no extracardiac findings. Recommended dedicated study if concerned for non-cardiac pathology. 8. Wrap around and breath hold artifacts noted. This decreased the  sensitivity of volumetric assessments. IMPRESSION: 2018 [facility] Criteria for Myocarditis met. Suggestive of myopericarditis with severely reduced LVEF. Rudean Haskell MD Electronically Signed   By: Rudean Haskell M.D.   On: 03/21/2021 19:15   ? ? ?Medications:   ? ? ?Scheduled Medications: ? aspirin EC  81 mg Oral Daily  ? carvedilol  3.125 mg Oral BID WC  ? colchicine  0.3 mg Oral BID  ? enoxaparin (LOVENOX) injection  60 mg Subcutaneous QHS  ? nicotine  21 mg Transdermal Daily  ? sacubitril-valsartan  1 tablet  Oral BID  ? sodium chloride flush  3 mL Intravenous Q12H  ? sodium chloride flush  3 mL Intravenous Q12H  ? spironolactone  12.5 mg Oral Daily  ? ? ?Infusions: ? sodium chloride    ? sodium chloride    ? ? ?PRN Medications: ?sodium chloride, sodium chloride, acetaminophen, benzonatate, ondansetron (ZOFRAN) IV, sodium chloride flush, sodium chloride flush ? ? ?Assessment/Plan:  ? ? ?1. 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 ?- Viral panel negative ?- cMRI EF 15% with markedly dilated LV with evidence of diffuse myopericarditis. RV 33% ?- Continue colchicine 0.3 mg BID ?- Continue coreg 3.125 mg BID ?- Continue spiro 12.5 mg daily ?- Continue Entresto 49/51 mg BID ?- Start Farxiga 10 mg daily ? ?2. HTN ?- BP much improved ? ?3. Hypokalemia ?- supp K as needed ?- 4.0 today. ? ?4. Tobacco use ?- continue nicotine patch ?- smoking cessation discussed ? ?5. CKD 2 ?- stable  ? ?OK for d/c today from HF standpoint. F/u in HF clinic arranged.  ? ?Length of Stay: 3 ? ? ?Glori Bickers MD ?03/22/2021, 8:39 AM ? ?Advanced Heart Failure Team ?Pager [phone] (M-F; 7a - 4p)  ?Please contact [facility] for night-coverage after hours (4p -7a ) and weekends on [email or web address]  ? ? ?

## 2021-03-22 NOTE — TOC Transition Note (Signed)
Transition of Care (TOC) - CM/SW Discharge Note ? ? ?Patient Details  ?Name: Robert Daniels ?MRN: [id number] ?Date of Birth: [birth year] ? ?Transition of Care (TOC) CM/SW Contact:  ?Jemimah Cressy G., RN ?Phone Number: ?03/22/2021, 12:00 PM ? ? ?Clinical Narrative:    ? ?RNCM spoke to patient's nurse who will pick up medications from main pharmacy and deliver to patient's room prior to discharge home today.  Patient has medications needed for discharge today. ? ? ? ?  ?Barriers to Discharge: Continued Medical Work up ? ? ?Patient Goals and CMS Choice ?Patient states their goals for this hospitalization and ongoing recovery are:: wants remain independent ?  ?  ? ?Discharge Placement ?  ?           ?  ?  ?  ?  ? ?Discharge Plan and Services ?  ?  ?           ?  ?  ?  ?  ?  ?  ?  ?  ?  ?  ? ?Social Determinants of Health (SDOH) Interventions ?Financial Strain Interventions: Artist ? ? ?Readmission Risk Interventions ?No flowsheet data found. ? ? ? ? ?

## 2021-03-22 NOTE — Discharge Summary (Signed)
Physician Discharge Summary   Patient: CIRO TASHIRO MRN: [id number] DOB: [birth year]  Admit date:     03/19/2021  Discharge date: 03/22/21  Discharge Physician: Flora Lipps   PCP: Pcp, No   Recommendations at discharge:   Follow-up with your primary care physician in 1 week.  Check blood work at that time. Follow-up with cardiology clinic on 04/01/2021 at 3:30 PM.  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 *  Hospital Course: DEANDRAE WAJDA is a 36 y.o. male with no significant past medical history presented to hospital with chest pain cough and shortness of breath for 2 to 3 days.  Patient did have a history of flulike symptoms in December 2022 when he was diagnosed of having right-sided pneumonia and was treated with 7-day course of antibiotic.  On discharge patient, patient was noted to be tachycardic tachypneic blood pressure was elevated and his pulse oxygen was around 89% on minimal activity.  Troponin was noted to be elevated at 8600> 8400> 8300. Chest x-ray showed cardiomegaly and pulmonary edema.  CT angiogram negative for PE.  Cardiology was consulted and patient was then considered for admission to hospital for further evaluation and treatment.,  Patient underwent cardiac catheterization on 03/20/2021 within normal coronaries.  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   Chest x-ray with cardiomegaly.  2D echocardiogram showed reduced LV function at 25% or so.  Patient was consulted.  Patient underwent cardiac catheterization on 03/20/2021  with normal coronaries but reduced LV function at 25%.  MRI of the heart showed diffuse myopericarditis ejection fraction at 15%..  Cardiology stated to cardiac medication medications including Entresto, spironolactone, Coreg.  Patient will need to follow-up with the heart failure clinic as has been scheduled.  Currently compensated prior to discharge.  HTN (hypertension) Improved on Coreg and Entresto.    Cigarette smoker Counseling done.  Received nicotine patch during hospitalization.  AKI (acute kidney injury) ([place]) With signs of fluid overload so patient received Lasix.  2D echocardiogram shows reduced LV function.  Continue to monitor renal function.  Morbid obesity with BMI of 40.0-44.9, adult [facility]) Patient would benefit from weight loss as outpatient.  CHF (congestive heart failure) (HCC) Acute systolic CHF.  Continue aspirin, Coreg, Entresto and Aldactone on discharge.  Low-salt and CHF instructions given   Consultants: Cardiology  Procedures performed:  Cardiac catheterization on 03/20/2021 Cardiac MRI  Disposition: Home Diet recommendation:  Discharge Diet Orders (From admission, onward)     Start     Ordered   03/22/21 0000  Diet - low sodium heart healthy        03/22/21 1047           Cardiac diet DISCHARGE MEDICATION: Allergies as of 03/22/2021   No Known Allergies      Medication List     STOP taking these medications    amoxicillin-clavulanate 875-125 MG tablet Commonly known as: AUGMENTIN   azithromycin 250 MG tablet Commonly known as: ZITHROMAX   benzonatate 100 MG capsule Commonly known as: TESSALON   cyclobenzaprine 10 MG  tablet Commonly known as: FLEXERIL   ibuprofen 600 MG tablet Commonly known as: ADVIL       TAKE these medications    Aspirin Low Dose 81 MG EC tablet Generic drug: aspirin Take 1 tablet (81 mg total) by mouth daily. Swallow whole.   carvedilol 3.125 MG tablet Commonly known as: COREG Take 1 tablet (3.125  mg total) by mouth 2 (two) times daily with a meal.   colchicine 0.6 MG tablet Take 1/2 tablet (0.3 mg total) by mouth 2 (two) times daily.   Entresto 49-51 MG Generic drug: sacubitril-valsartan Take 1 tablet by mouth 2 (two) times daily.   Farxiga 10 MG Tabs tablet Generic drug: dapagliflozin propanediol Take 1 tablet (10 mg total) by mouth daily before breakfast.   spironolactone 25 MG tablet Commonly known as: ALDACTONE Take 1/2 tablet (12.5 mg total) by mouth daily.        Follow-up Information     [facility] Follow up on 04/01/2021.   Specialty: Cardiology Why: Advanced Heart Failure Clinic at [facility] 3:30 pm Entrance C, Garage Code 9244 Contact information: [address] [id number] [place] [phone]        Primary care provider Follow up in 1 week(s).   Why: regular checkup               Subjective Today, patient was seen and examined at bedside.  Denies any chest pain, shortness of breath, fever or chills.  Discharge Exam: Filed Weights   03/20/21 0453 03/21/21 0536 03/22/21 0518  Weight: 128.4 kg 127.9 kg 128.1 kg   Vitals with BMI 03/22/2021 03/22/2021 03/21/2021  Height - - -  Weight - 282 lbs 7 oz -  BMI - 32.91 -  Systolic 916 606 004  Diastolic 83 95 94  Pulse 86 83 89    General: Obese built, not in obvious distress, lying flat in bed HENT:   No scleral pallor or icterus noted. Oral mucosa is moist.  Chest:  Clear breath sounds.  Diminished breath sounds bilaterally. No crackles or wheezes.  CVS: S1 &S2 heard. No murmur.  Regular rate and rhythm. Abdomen: Soft, nontender, nondistended.  Bowel sounds are heard.   Extremities: No cyanosis, clubbing or edema.  Peripheral pulses are palpable. Psych: Alert, awake and oriented, normal mood CNS:  No cranial nerve deficits.  Power equal in all extremities.   Skin: Warm and dry.  No rashes noted.   Condition  at discharge: good  The results of significant diagnostics from this hospitalization (including imaging, microbiology, ancillary and laboratory) are listed below for reference.   Imaging Studies: DG Chest 2 View  Result Date: 03/19/2021 CLINICAL DATA:  Productive cough with chest pain and shortness of breath EXAM: CHEST - 2 VIEW COMPARISON:  12/18/2020 FINDINGS: Cardiomegaly with thickened hila and bilateral fissure thickening. No visible effusion or pneumothorax. Negative aortic contours. No acute osseous finding. IMPRESSION: Cardiomegaly and vascular congestion. Haziness of the bilateral chest could be infection or alveolar edema. Electronically Signed   By: Jorje Guild M.D.   On: 03/19/2021 07:28   CT Angio Chest PE W and/or Wo Contrast  Result Date: 03/19/2021 CLINICAL DATA:  Concern for pulmonary embolism. Acute onset chest pain. Elevated troponin. Elevated D-dimer. EXAM: CT ANGIOGRAPHY CHEST WITH CONTRAST TECHNIQUE: Multidetector CT imaging of the chest was performed using the standard protocol during bolus administration of intravenous contrast. Multiplanar CT image reconstructions and MIPs were obtained to  evaluate the vascular anatomy. RADIATION DOSE REDUCTION: This exam was performed according to the departmental dose-optimization program which includes automated exposure control, adjustment of the mA and/or kV according to patient size and/or use of iterative reconstruction technique. CONTRAST:  149m OMNIPAQUE IOHEXOL 350 MG/ML SOLN COMPARISON:  None. FINDINGS: Cardiovascular: No filling defects within the pulmonary arteries to suggest acute pulmonary embolism. No pericardial effusion. LEFT heart appears enlarged. Mediastinum/Nodes: No axillary or supraclavicular adenopathy. No mediastinal or hilar adenopathy. No pericardial fluid. Esophagus normal. Lungs/Pleura: There is nodular airspace disease diffusely with a lower lobe predominance. Mild interlobular septal thickening in the upper  lobes. No pleural effusion. Upper Abdomen: Limited view of the liver, kidneys, pancreas are unremarkable. Normal adrenal glands. Musculoskeletal: No aggressive osseous lesion. Review of the MIP images confirms the above findings. IMPRESSION: 1. No evidence acute pulmonary embolism. 2. Fine nodular airspace disease diffusely in the lower lobes suggest pulmonary edema. 3. LEFT heart appears enlarged. 4. No pericardial effusion. Findings conveyed toCHRISTOPHER TEGELER on 03/19/2021  at12:28. Electronically Signed   By: SSuzy BouchardM.D.   On: 03/19/2021 12:30   CARDIAC CATHETERIZATION  Result Date: 03/20/2021 Findings: RA = 3 RV = 32/10 PA = 34/20 (26) PCW = 17 Fick cardiac output/index = 6.1/2.5 PVR = 1.5 WU Ao sat = 94% PA sat = 68%, 68% Assessment: 1. NICM  with EF 25%. Suspect myocarditis 2. Well-compensated filling pressures Plan/Discussion: Check cMRI. Titrate GDMT. DGlori Bickers MD 11:06 AM  [place]  Result Date: 03/19/2021 CLINICAL DATA:  Acute renal insufficiency. EXAM: RENAL / URINARY TRACT ULTRASOUND COMPLETE COMPARISON:  None. FINDINGS: Right Kidney: Renal measurements: 11.2 x 6.3 x 5.1 cm = volume: 187.9 mL. Normal renal cortical thickness and echogenicity without focal lesions or hydronephrosis. Left Kidney: Renal measurements: 10.7 x 6.1 x 5.1 cm = volume: 172.0 mL. Normal renal cortical thickness and echogenicity without focal lesions or hydronephrosis. Bladder: Appears normal for degree of bladder distention. Other: None. IMPRESSION: Normal renal ultrasound examination. Electronically Signed   By: PMarijo SanesM.D.   On: 03/19/2021 18:11   DG CHEST PORT 1 VIEW  Result Date: 03/20/2021 CLINICAL DATA:  CHF EXAM: PORTABLE CHEST 1 VIEW COMPARISON:  Yesterday CT and radiography from yesterday FINDINGS: Cardiomegaly. Stable aortic and hilar contours. There is no edema, consolidation, effusion, or pneumothorax. Artifact from EKG leads. IMPRESSION: Improved pulmonary edema. Electronically  Signed   By: JJorje GuildM.D.   On: 03/20/2021 06:37   MR CARDIAC MORPHOLOGY W WO CONTRAST  Result Date: 03/21/2021 CLINICAL DATA:  Clinical question of myopericarditis Study assumes BSA of 2.55 m2. EXAM: CARDIAC MRI TECHNIQUE: The patient was scanned on a 1.5 Tesla GE magnet. A dedicated cardiac coil was used. Functional imaging was done using Fiesta sequences. 2,3, and 4 chamber views were done to assess for RWMA's. Modified Simpson's rule using a short axis stack was used to calculate an ejection fraction on a dedicated work sConservation officer, nature The patient received 10 cc of Gadavist. After 10 minutes inversion recovery sequences were used to assess for infiltration and scar tissue. CONTRAST:  10 cc  of Gadavist FINDINGS: 1. Severe left ventricular dilation, with LVEDD 80 mm, and LVEDVi 170 mL/m2. Mild asymmetric remodeling, with intraventricular septal thickness of 12 mm, posterior wall thickness of 7 mm, but myocardial mass index of 86 g/m2. Severe left ventricular systolic dysfunction (LVEF =16%). Global hypokinesis worse in the basal septum and inferior and worse in the apex (all segments). GLS -4%. There is  no evidence of LV thrombus. Left ventricular parametric mapping notable for increased T2 Stir signal basal inferior, mid inferior and inferolateral, anterolateral, and anterior, and apical (all segments). Highest T2 signal 70 ms. Increase in native T1 signal in the inferior wall, highest inferior apex 1200 Ms. There is late gadolinium enhancement in the left ventricular myocardium: There is a apical-mid, inferior, mid wall LGE stripe. 2.  Mild increase in right ventricular size with RVEDVI 97 mL/m2. Normal right ventricular thickness. Moderately reduced right ventricular systolic function (RVEF =97%). Septal hypokinesis. 3.  Normal left and right atrial size. 4. Normal size of the aortic root, ascending aorta and pulmonary artery. 5. Valve assessment: Aortic Valve:Tri-leaflet aortic  valve. Qualitatively no significant regurgitation. Pulmonic Valve: Qualitatively no significant regurgitation. Tricuspid Valve: Qualitatively no significant regurgitation. Mitral Valve: Mild central regurgitation, regurgitant fraction 16%. 6. Normal pericardium. No pericardial thickening. No pericardial effusion. There is slight pericardial enhancement over the inferior RV. 7. Grossly, no extracardiac findings. Recommended dedicated study if concerned for non-cardiac pathology. 8. Wrap around and breath hold artifacts noted. This decreased the sensitivity of volumetric assessments. IMPRESSION: 2018 [facility] Criteria for Myocarditis met. Suggestive of myopericarditis with severely reduced LVEF. Rudean Haskell MD Electronically Signed   By: Rudean Haskell M.D.   On: 03/21/2021 19:15   ECHOCARDIOGRAM COMPLETE  Result Date: 03/19/2021    ECHOCARDIOGRAM REPORT   Patient Name:   TEX CONROY Date of Exam: 03/19/2021 Medical Rec #:  [id number]        Height:       72.0 in Accession #:    [id number]       Weight:       290.0 lb Date of Birth:  11/17/1985        BSA:          2.495 m Patient Age:    36 years         BP:           129/81 mmHg Patient Gender: M                HR:           94 bpm. Exam Location:  Inpatient Procedure: 2D Echo, Cardiac Doppler, Color Doppler and Intracardiac            Opacification Agent Indications:    R07.9* Chest pain, unspecified  History:        Patient has no prior history of Echocardiogram examinations.  Sonographer:    Bernadene Person RDCS Referring Phys: [id number] [place]  1. Left ventricular ejection fraction, by estimation, is 20 to 25%. The left ventricle has severely decreased function. The left ventricle demonstrates global hypokinesis. The left ventricular internal cavity size was severely dilated. Indeterminate diastolic filling due to E-A fusion.  2. Right ventricular systolic function is normal. The right ventricular size is  normal. Tricuspid regurgitation signal is inadequate for assessing PA pressure.  3. Left atrial size was moderately dilated.  4. The mitral valve is normal in structure. Mild mitral valve regurgitation. No evidence of mitral stenosis.  5. The aortic valve is grossly normal. Aortic valve regurgitation is not visualized. No aortic stenosis is present.  6. Aortic dilatation noted. There is mild dilatation of the ascending aorta, measuring 41 mm.  7. The inferior vena cava is normal in size with greater than 50% respiratory variability, suggesting right atrial pressure of 3 mmHg. Comparison(s): No prior Echocardiogram. Conclusion(s)/Recommendation(s): Severely reduced LVEF with global  hypokinesis. Findings reported to Dr. Roosevelt Locks and Vikki Ports, PA. FINDINGS  Left Ventricle: Contrast shows very sluggish flow at the LV apex without definitive thrombus. Left ventricular ejection fraction, by estimation, is 20 to 25%. The left ventricle has severely decreased function. The left ventricle demonstrates global hypokinesis. Definity contrast agent was given IV to delineate the left ventricular endocardial borders. The left ventricular internal cavity size was severely dilated. There is no left ventricular hypertrophy. Indeterminate diastolic filling due to E-A fusion. Right Ventricle: The right ventricular size is normal. Right vetricular wall thickness was not well visualized. Right ventricular systolic function is normal. Tricuspid regurgitation signal is inadequate for assessing PA pressure. Left Atrium: Left atrial size was moderately dilated. Right Atrium: Right atrial size was normal in size. Pericardium: There is no evidence of pericardial effusion. Mitral Valve: The mitral valve is normal in structure. Mild mitral valve regurgitation. No evidence of mitral valve stenosis. Tricuspid Valve: The tricuspid valve is normal in structure. Tricuspid valve regurgitation is trivial. No evidence of tricuspid stenosis. Aortic  Valve: The aortic valve is grossly normal. Aortic valve regurgitation is not visualized. No aortic stenosis is present. Pulmonic Valve: The pulmonic valve was not well visualized. Pulmonic valve regurgitation is not visualized. No evidence of pulmonic stenosis. Aorta: Aortic dilatation noted. There is mild dilatation of the ascending aorta, measuring 41 mm. Venous: The inferior vena cava is normal in size with greater than 50% respiratory variability, suggesting right atrial pressure of 3 mmHg. IAS/Shunts: The atrial septum is grossly normal.  LEFT VENTRICLE PLAX 2D LVIDd:         7.00 cm LVIDs:         6.10 cm LV PW:         1.10 cm LV IVS:        1.20 cm LVOT diam:     2.40 cm      3D Volume EF: LV SV:         43           3D EF:        23 % LV SV Index:   17           LV EDV:       376 ml LVOT Area:     4.52 cm     LV ESV:       291 ml                             LV SV:        85 ml  LV Volumes (MOD) LV vol d, MOD A2C: 256.0 ml LV vol d, MOD A4C: 219.0 ml LV vol s, MOD A2C: 185.0 ml LV vol s, MOD A4C: 160.0 ml LV SV MOD A2C:     71.0 ml LV SV MOD A4C:     219.0 ml LV SV MOD BP:      65.6 ml RIGHT VENTRICLE RV S prime:     9.12 cm/s TAPSE (M-mode): 1.8 cm LEFT ATRIUM             Index        RIGHT ATRIUM           Index LA diam:        5.00 cm 2.00 cm/m   RA Area:     16.00 cm LA Vol (A2C):   85.1 ml 34.11 ml/m  RA Volume:   46.10 ml  18.48 ml/m LA Vol (A4C):   78.5 ml 31.46 ml/m LA Biplane Vol: 86.3 ml 34.59 ml/m  AORTIC VALVE LVOT Vmax:   60.60 cm/s LVOT Vmean:  39.450 cm/s LVOT VTI:    0.094 m  AORTA Ao Root diam: 3.90 cm Ao Asc diam:  4.10 cm MR PISA:        0.57 cm MR PISA Radius: 0.30 cm   SHUNTS                           Systemic VTI:  0.09 m                           Systemic Diam: 2.40 cm Buford Dresser MD Electronically signed by Buford Dresser MD Signature Date/Time: 03/19/2021/3:45:32 PM    Final     Microbiology: Results for orders placed or performed during the hospital  encounter of 03/19/21  Resp Panel by RT-PCR (Flu A&B, Covid) Nasopharyngeal Swab     Status: None   Collection Time: 03/19/21  7:30 AM   Specimen: Nasopharyngeal Swab; Nasopharyngeal(NP) swabs in vial transport medium  Result Value Ref Range Status   SARS Coronavirus 2 by RT PCR NEGATIVE NEGATIVE Final    Comment: (NOTE) SARS-CoV-2 target nucleic acids are NOT DETECTED.  The SARS-CoV-2 RNA is generally detectable in upper respiratory specimens during the acute phase of infection. The lowest concentration of SARS-CoV-2 viral copies this assay can detect is 138 copies/mL. A negative result does not preclude SARS-Cov-2 infection and should not be used as the sole basis for treatment or other patient management decisions. A negative result may occur with  improper specimen collection/handling, submission of specimen other than nasopharyngeal swab, presence of viral mutation(s) within the areas targeted by this assay, and inadequate number of viral copies(<138 copies/mL). A negative result must be combined with clinical observations, patient history, and epidemiological information. The expected result is Negative.  Fact Sheet for Patients:  EntrepreneurPulse.com.au  Fact Sheet for Healthcare Providers:  IncredibleEmployment.be  This test is no t yet approved or cleared by the [place] FDA and  has been authorized for detection and/or diagnosis of SARS-CoV-2 by FDA under an Emergency Use Authorization (EUA). This EUA will remain  in effect (meaning this test can be used) for the duration of the COVID-19 declaration under Section 564(b)(1) of the Act, 21 U.S.C.section 360bbb-3(b)(1), unless the authorization is terminated  or revoked sooner.       Influenza A by PCR NEGATIVE NEGATIVE Final   Influenza B by PCR NEGATIVE NEGATIVE Final    Comment: (NOTE) The Xpert Xpress SARS-CoV-2/FLU/RSV plus assay is intended as an aid in the diagnosis of  influenza from Nasopharyngeal swab specimens and should not be used as a sole basis for treatment. Nasal washings and aspirates are unacceptable for Xpert Xpress SARS-CoV-2/FLU/RSV testing.  Fact Sheet for Patients: EntrepreneurPulse.com.au  Fact Sheet for Healthcare Providers: IncredibleEmployment.be  This test is not yet approved or cleared by the [place] FDA and has been authorized for detection and/or diagnosis of SARS-CoV-2 by FDA under an Emergency Use Authorization (EUA). This EUA will remain in effect (meaning this test can be used) for the duration of the COVID-19 declaration under Section 564(b)(1) of the Act, 21 U.S.C. section 360bbb-3(b)(1), unless the authorization is terminated or revoked.  Performed at [facility], [address] [place], [place] [address]     Labs: CBC: Recent  Labs  Lab 03/19/21 2929 03/20/21 0237 03/20/21 1040 03/20/21 1044 03/20/21 1045 03/21/21 0221 03/22/21 0209  WBC 11.0* 9.4  --   --   --  8.5 8.9  NEUTROABS 7.5  --   --   --   --   --   --   HGB 15.0 15.2 13.6 14.6 13.9 15.7 16.0  HCT 44.7 45.1 40.0 43.0 41.0 45.5 47.7  MCV 89.4 87.7  --   --   --  87.7 89.3  PLT 239 234  --   --   --  242 090   Basic Metabolic Panel: Recent Labs  Lab 03/19/21 0712 03/19/21 1225 03/20/21 0237 03/20/21 1040 03/20/21 1044 03/20/21 1045 03/21/21 0221 03/22/21 0209  NA 139  --  139 144 142 145 136 139  K 4.1  --  3.3* 3.0* 3.3* 3.0* 4.0 4.0  CL 106  --  106  --   --   --  106 107  CO2 25  --  22  --   --   --  22 25  GLUCOSE 107*  --  75  --   --   --  103* 101*  BUN 12  --  9  --   --   --  12 9  CREATININE 1.55*  --  1.42*  --   --   --  1.44* 1.40*  CALCIUM 8.7*  --  9.0  --   --   --  8.6* 8.7*  MG  --  1.8  --   --   --   --   --   --    Liver Function Tests: No results for input(s): AST, ALT, ALKPHOS, BILITOT, PROT, ALBUMIN in the last 168 hours. CBG: No results for  input(s): GLUCAP in the last 168 hours.  Discharge time spent: greater than 30 minutes.  Signed: Flora Lipps, MD [facility] 03/22/2021

## 2021-03-25 ENCOUNTER — Telehealth (HOSPITAL_COMMUNITY): Payer: Self-pay | Admitting: Pharmacy Technician

## 2021-03-25 ENCOUNTER — Other Ambulatory Visit (HOSPITAL_COMMUNITY): Payer: Self-pay

## 2021-03-25 MED ORDER — SACUBITRIL-VALSARTAN 49-51 MG PO TABS: 1.0000 | ORAL_TABLET | Freq: Two times a day (BID) | ORAL | 3 refills | Status: DC

## 2021-03-25 NOTE — Telephone Encounter (Signed)
Advanced Heart Failure Patient Advocate Encounter ? ?Sent in [employer] patient assistance application via fax. POI will be required to complete application process. Document scanned to chart.  ? ?

## 2021-03-31 NOTE — Progress Notes (Signed)
? ?ADVANCED HF CLINIC CONSULT NOTE ? ?Primary Care: none ?HF Cardiologist: Dr. Gala Romney ? ?HPI: ?[name] is a 36 y.o. with past medical history of obesity, tobacco abuse and new diagnosis of systolic heart failure/NICM. ? ?Patient was seen in the ED 3 times in 2022. He was seen in March 2022 for chest wall pain that was musculoskeletal. Was later seen in April 2022 for a viral syndrome. Most recently seen in December 2022 for RML and RLL PNA.  ?  ?Admitted 3/23 with CP and + Hs-Troponin. Echo showed EF 2-25%, global LV HK, RV ok. Underwent R/LHC showing normal cors, well-compensated filling pressures, EF 25%. cMRI with LVEF 15%, RVEF 33%, findings consistent with myopericarditis. Viral panel negative. Started on GDMT and colchicine. Discharged home, weight 282 lbs. ? ?Today he returns for HF follow up with his wife. Overall feeling fine. He has no SOB with activity or ADLs. Denies abnormal bleeding, palpitations, CP, dizziness, edema, or PND/Orthopnea. Appetite ok. No fever or chills. He is not weighing at home. Taking all medications. Quit smoking, previously smoked 1 pack every 3 days.  ? ?Cardiac Studies ?- cMRI (3/23): EF 15% with markedly dilated LV with evidence of diffuse myopericarditis. RV 33% ?  ?- R/LHC (3/23): normal cors EF 25%  ?RA = 3 ?RV = 32/10 ?PA = 34/20 (26) ?PCW = 17 ?Fick cardiac output/index = 6.1/2.5 ?PVR = 1.5 WU ?Ao sat = 94% ?PA sat = 68%, 68% ? ?Review of Systems: [y] = yes, [ ]  = no  ? ?General: Weight gain [ ] ; Weight loss [ ] ; Anorexia [ ] ; Fatigue [ ] ; Fever [ ] ; Chills [ ] ; Weakness [ ]   ?Cardiac: Chest pain/pressure [ ] ; Resting SOB [ ] ; Exertional SOB [ ] ; Orthopnea [ ] ; Pedal Edema [ ] ; Palpitations [ ] ; Syncope [ ] ; Presyncope [ ] ; Paroxysmal nocturnal dyspnea[ ]   ?Pulmonary: Cough [ ] ; Wheezing[ ] ; Hemoptysis[ ] ; Sputum [ ] ; Snoring [ ]   ?GI: Vomiting[ ] ; Dysphagia[ ] ; Melena[ ] ; Hematochezia [ ] ; Heartburn[ ] ; Abdominal pain [ ] ; Constipation [ ] ; Diarrhea [ ] ; BRBPR [ ]    ?GU: Hematuria[ ] ; Dysuria [ ] ; Nocturia[ ]   ?Vascular: Pain in legs with walking [ ] ; Pain in feet with lying flat [ ] ; Non-healing sores [ ] ; Stroke [ ] ; TIA [ ] ; Slurred speech [ ] ;  ?Neuro: Headaches[ ] ; Vertigo[ ] ; Seizures[ ] ; Paresthesias[ ] ;Blurred vision [ ] ; Diplopia [ ] ; Vision changes [ ]   ?Ortho/Skin: Arthritis [ ] ; Joint pain [ ] ; Muscle pain [ ] ; Joint swelling [ ] ; Back Pain [ ] ; Rash [ ]   ?Psych: Depression[ ] ; Anxiety[ ]   ?Heme: Bleeding problems [ ] ; Clotting disorders [ ] ; Anemia [ ]   ?Endocrine: Diabetes [ ] ; Thyroid dysfunction[ ]  ? ?No past medical history on file. ? ?Current Outpatient Medications  ?Medication Sig Dispense Refill  ? aspirin 81 MG EC tablet Take 1 tablet (81 mg total) by mouth daily. Swallow whole. 30 tablet 1  ? carvedilol (COREG) 3.125 MG tablet Take 1 tablet (3.125 mg total) by mouth 2 (two) times daily with a meal. 60 tablet 1  ? colchicine 0.6 MG tablet Take 1/2 tablet (0.3 mg total) by mouth 2 (two) times daily. 60 tablet 1  ? dapagliflozin propanediol (FARXIGA) 10 MG TABS tablet Take 1 tablet (10 mg total) by mouth daily before breakfast. 30 tablet 1  ? sacubitril-valsartan (ENTRESTO) 49-51 MG Take 1 tablet by mouth 2 (two) times daily. 180 tablet 3  ?  spironolactone (ALDACTONE) 25 MG tablet Take 1/2 tablet (12.5 mg total) by mouth daily. 16 tablet 1  ? ?No current facility-administered medications for this encounter.  ? ?No Known Allergies ? ?Social History  ? ?Socioeconomic History  ? Marital status: Single  ?  Spouse name: Not on file  ? Number of children: Not on file  ? Years of education: Not on file  ? Highest education level: Not on file  ?Occupational History  ? Not on file  ?Tobacco Use  ? Smoking status: Every Day  ?  Types: Cigars, Cigarettes  ? Smokeless tobacco: Never  ?Vaping Use  ? Vaping Use: Never used  ?Substance and Sexual Activity  ? Alcohol use: Yes  ?  Comment: occ  ? Drug use: Never  ? Sexual activity: Not on file  ?Other Topics Concern  ? Not  on file  ?Social History Narrative  ? Not on file  ? ?Social Determinants of Health  ? ?Financial Resource Strain: High Risk  ? Difficulty of Paying Living Expenses: Hard  ?Food Insecurity: Not on file  ?Transportation Needs: Not on file  ?Physical Activity: Not on file  ?Stress: Not on file  ?Social Connections: Not on file  ?Intimate Partner Violence: Not on file  ? ?No family history on file. ? ?BP 122/90   Pulse 97   Wt 133.4 kg (294 lb)   SpO2 99%   BMI 39.87 kg/m?  ? ?Wt Readings from Last 3 Encounters:  ?04/01/21 133.4 kg (294 lb)  ?03/22/21 128.1 kg (282 lb 6.6 oz)  ?12/18/20 94.3 kg (208 lb)  ? ?PHYSICAL EXAM: ?General:  NAD. No resp difficulty ?HEENT: Normal ?Neck: Supple. No JVD. Carotids 2+ bilat; no bruits. No lymphadenopathy or thryomegaly appreciated. ?Cor: PMI nondisplaced. Regular rate & rhythm. No rubs, gallops or murmurs. ?Lungs: Clear ?Abdomen: Obese, nontender, nondistended. No hepatosplenomegaly. No bruits or masses. Good bowel sounds. ?Extremities: No cyanosis, clubbing, rash, edema ?Neuro: Alert & oriented x 3, cranial nerves grossly intact. Moves all 4 extremities w/o difficulty. Affect pleasant. ? ?ECG: NSR 96 bpm (personally reviewed). ? ?ASSESSMENT & PLAN: ?1. Chronic Systolic Heart Failure ?- due to acute myopericarditis/NICM  ?- Echo (3/23): EF 25% ?- R/LHC (3/23): normal cors and well compensated filling pressures. ?- Viral panel negative ?- cMRI (3/23): LVEF 15% with markedly dilated LV, RVEF 33%, with evidence of diffuse myopericarditis. ?- NYHA I-II, volume looks good today. ?- Increase spironolactone to 25 mg daily. ?- Continue colchicine 0.3 mg bid x 3 months. ?- Continue Coreg 3.125 mg bid. ?- Continue Entresto 49/51 mg bid. ?- Continue Farxiga 10 mg daily. ?- BMET today, repeat in 1 week. ?  ?2. HTN ?- BP much improved. ?- GDMT as above. ?  ?3. H/o Hypokalemia ?- Supp K as needed. ?- Labs today. ?  ?4. Tobacco use ?- Congratulated on quitting. ?  ?5. CKD 2 ?- Labs today. ?-  On SGLT2i. ? ?6. Snoring ?- Consider sleep study when he has insurance. ? ?7. SDOH ?- Needs PCP, given list of PCPs to establish care.  ?- Gifted a scale today. ?- Says he will have insurance starting 04/12/21. Asked  him to bring card next visit. ? ?Follow up with APP in 4 weeks (increase Entresto, may need PRN loop) and Dr. Gala Romney in 8 weeks + echo. ? ?[name], FNP-BC ?04/01/21 ? ?

## 2021-04-01 ENCOUNTER — Encounter (HOSPITAL_COMMUNITY): Payer: Self-pay

## 2021-04-01 ENCOUNTER — Other Ambulatory Visit: Payer: Self-pay

## 2021-04-01 ENCOUNTER — Ambulatory Visit (HOSPITAL_COMMUNITY)
Admit: 2021-04-01 | Discharge: 2021-04-01 | Disposition: A | Discharge status: Home / Self Care | Payer: Self-pay | Attending: Family Medicine | Admitting: Family Medicine

## 2021-04-01 VITALS — BP 122/90 | HR 97 | Wt 294.0 lb

## 2021-04-01 DIAGNOSIS — I428 Other cardiomyopathies: Secondary | ICD-10-CM | POA: Insufficient documentation

## 2021-04-01 DIAGNOSIS — Z79899 Other long term (current) drug therapy: Secondary | ICD-10-CM | POA: Insufficient documentation

## 2021-04-01 DIAGNOSIS — I5022 Chronic systolic (congestive) heart failure: Secondary | ICD-10-CM | POA: Insufficient documentation

## 2021-04-01 DIAGNOSIS — Z8639 Personal history of other endocrine, nutritional and metabolic disease: Secondary | ICD-10-CM

## 2021-04-01 DIAGNOSIS — N182 Chronic kidney disease, stage 2 (mild): Secondary | ICD-10-CM | POA: Insufficient documentation

## 2021-04-01 DIAGNOSIS — I130 Hypertensive heart and chronic kidney disease with heart failure and stage 1 through stage 4 chronic kidney disease, or unspecified chronic kidney disease: Secondary | ICD-10-CM | POA: Insufficient documentation

## 2021-04-01 DIAGNOSIS — Z87891 Personal history of nicotine dependence: Secondary | ICD-10-CM | POA: Insufficient documentation

## 2021-04-01 DIAGNOSIS — Z6839 Body mass index (BMI) 39.0-39.9, adult: Secondary | ICD-10-CM | POA: Insufficient documentation

## 2021-04-01 DIAGNOSIS — R0683 Snoring: Secondary | ICD-10-CM | POA: Insufficient documentation

## 2021-04-01 DIAGNOSIS — I10 Essential (primary) hypertension: Secondary | ICD-10-CM

## 2021-04-01 DIAGNOSIS — Z139 Encounter for screening, unspecified: Secondary | ICD-10-CM

## 2021-04-01 DIAGNOSIS — E669 Obesity, unspecified: Secondary | ICD-10-CM | POA: Insufficient documentation

## 2021-04-01 LAB — BASIC METABOLIC PANEL
Anion gap: 8 (ref 5–15)
BUN: 12 mg/dL (ref 6–20)
CO2: 22 mmol/L (ref 22–32)
Calcium: 9 mg/dL (ref 8.9–10.3)
Chloride: 110 mmol/L (ref 98–111)
Creatinine, Ser: 1.38 mg/dL — ABNORMAL HIGH (ref 0.61–1.24)
GFR, Estimated: >60 mL/min (ref >60)
Glucose, Bld: 92 mg/dL (ref 70–99)
Potassium: 4.1 mmol/L (ref 3.5–5.1)
Sodium: 140 mmol/L (ref 135–145)

## 2021-04-01 MED ORDER — SPIRONOLACTONE 25 MG PO TABS: 25.0000 mg | ORAL_TABLET | Freq: Every day | ORAL | 1 refills | Status: DC

## 2021-04-01 NOTE — Addendum Note (Signed)
Encounter addended by: Theresia Bough, CMA on: 04/01/2021 4:37 PM ? Actions taken: Order list changed, Diagnosis association updated

## 2021-04-01 NOTE — Patient Instructions (Signed)
INCREASE Spironolactone 25 mg, one tab daily ? ? ?Labs today ?We will only contact you if something comes back abnormal or we need to make some changes. ?Otherwise no news is good news! ? ?Labs needed in 7-10 days ? ?Be sure to schedule an appointment with at PCP ? ?Your physician recommends that you schedule a follow-up appointment in: 3-4 weeks  in the [facility] Pract[facility] (PA/NP) Clinic and in 8-10 weeks with Dr Gala Romney ? ?Do the following things EVERYDAY: ?Weigh yourself in the morning before breakfast. Write it down and keep it in a log. ?Take your medicines as prescribed ?Eat low salt foods--Limit salt (sodium) to 2000 mg per day.  ?Stay as active as you can everyday ?Limit all fluids for the day to less than 2 liters ? ?At the [facility], you and your health needs are our priority. As part of our continuing mission to provide you with exceptional heart care, we have created designated Provider Care Teams. These Care Teams include your primary Cardiologist (physician) and Advanced Practice Providers (APPs- Physician Assistants and Nurse Practitioners) who all work together to provide you with the care you need, when you need it.  ? ?You may see any of the following providers on your designated Care Team at your next follow up: ?Dr Arvilla Meres ?Dr Marca Ancona ?Tonye Becket, NP ?Robbie Lis, PA ?Jessica Milford,NP ?Anna Genre, PA ?Karle Plumber, PharmD ? ? ?Please be sure to bring in all your medications bottles to every appointment.  ? ? ?If you have any questions or concerns before your next appointment please send [place] a message through [place] or call our office at [phone].   ? ?TO LEAVE A MESSAGE FOR THE NURSE SELECT OPTION 2, PLEASE LEAVE A MESSAGE INCLUDING: ?YOUR NAME ?DATE OF BIRTH ?CALL BACK NUMBER ?REASON FOR CALL**this is important as we prioritize the call backs ? ?YOU WILL RECEIVE A CALL BACK THE SAME DAY AS LONG AS YOU CALL BEFORE 4:00 PM ? ? ?

## 2021-04-08 NOTE — Telephone Encounter (Signed)
Received notification from [employer] that POI is needed to continue processing the patient's application. Called and left the patient a detailed message with what POI [employer] will accept. If the patient does not have any of those forms of income, he would need to call and request an income attestation letter to be sent out to fill in and return.  ? ?Robert Daniels, CPhT ? ?

## 2021-04-11 ENCOUNTER — Other Ambulatory Visit (HOSPITAL_COMMUNITY): Payer: Self-pay

## 2021-04-15 ENCOUNTER — Other Ambulatory Visit (HOSPITAL_COMMUNITY): Payer: Self-pay

## 2021-04-16 ENCOUNTER — Ambulatory Visit: Payer: MEDICAID | Attending: Physician Assistant | Admitting: Physician Assistant

## 2021-04-16 ENCOUNTER — Encounter: Payer: Self-pay | Admitting: Physician Assistant

## 2021-04-16 ENCOUNTER — Other Ambulatory Visit: Payer: Self-pay

## 2021-04-16 VITALS — BP 112/79 | HR 90 | Resp 16 | Ht 72.0 in | Wt 289.4 lb

## 2021-04-16 DIAGNOSIS — N179 Acute kidney failure, unspecified: Secondary | ICD-10-CM

## 2021-04-16 DIAGNOSIS — I509 Heart failure, unspecified: Secondary | ICD-10-CM

## 2021-04-16 DIAGNOSIS — B3322 Viral myocarditis: Secondary | ICD-10-CM

## 2021-04-16 DIAGNOSIS — Z09 Encounter for follow-up examination after completed treatment for conditions other than malignant neoplasm: Secondary | ICD-10-CM

## 2021-04-16 MED ORDER — COLCHICINE 0.6 MG PO TABS
0.3000 mg | ORAL_TABLET | Freq: Two times a day (BID) | ORAL | 1 refills | Status: DC
  Filled 2021-04-16: qty 30, 30d supply, fill #0

## 2021-04-16 MED ORDER — DAPAGLIFLOZIN PROPANEDIOL 10 MG PO TABS
10.0000 mg | ORAL_TABLET | Freq: Every day | ORAL | 1 refills | Status: DC
  Filled 2021-04-16: qty 30, 30d supply, fill #0
  Filled 2021-06-17: qty 30, 30d supply, fill #1

## 2021-04-16 MED ORDER — SPIRONOLACTONE 25 MG PO TABS
25.0000 mg | ORAL_TABLET | Freq: Every day | ORAL | 1 refills | Status: DC
  Filled 2021-04-16: qty 30, 30d supply, fill #0
  Filled 2021-06-17: qty 30, 30d supply, fill #1
  Filled 2021-07-21: qty 30, 30d supply, fill #2

## 2021-04-16 MED ORDER — CARVEDILOL 3.125 MG PO TABS
3.1250 mg | ORAL_TABLET | Freq: Two times a day (BID) | ORAL | 1 refills | Status: DC
  Filled 2021-04-16: qty 60, 30d supply, fill #0
  Filled 2021-06-17: qty 60, 30d supply, fill #1
  Filled 2021-07-21: qty 60, 30d supply, fill #2

## 2021-04-16 NOTE — Progress Notes (Signed)
Patient ID: Robert Daniels, male   DOB: May 14, 1985, 36 y.o.   MRN: [id number] ? ? ? ?Robert Daniels, is a 36 y.o. male ? ?[id number] ? ?[id number] ? ?DOB - Feb 18, 1985 ? ?Chief Complaint  ?Patient presents with  ? Hospitalization Follow-up  ? Medication Refill  ?    ? ?Subjective:  ? ?Robert Daniels is a 36 y.o. male here today for a follow up visit and to establish care After hospitalization 3/8-3/11/2021.  Seen in CHF clinic  3/21 (see summaries below)and has f/up appt 04/21/2021. On 3/21 weight =294.  Today 289.  He is feeling great.  No SOB.  No CP.  No HA.  He is compliant with his medications.  He is needing RF on everything except entresto.  He has been exercising and smoking 2 or less cigs daily.  He is weighing daily.  No edema.   ? ?From discharge summary) ?Discharge Diagnoses: ?Principal Problem: ?  Myocarditis (HCC) ?Active Problems: ?  Elevated troponin ?  CHF (congestive heart failure) (HCC) ?  Morbid obesity with BMI of 40.0-44.9, adult (HCC) ?  AKI (acute kidney injury) (HCC) ?  Cigarette smoker ?  HTN (hypertension) ?  Acute systolic heart failure (HCC) ?  ?Resolved Problems: ?  * No resolved hospital problems. * ?  ?Hospital Course: ?Robert Daniels is a 36 y.o. male with no significant past medical history presented to hospital with chest pain cough and shortness of breath for 2 to 3 days.  Patient did have a history of flulike symptoms in December 2022 when he was diagnosed of having right-sided pneumonia and was treated with 7-day course of antibiotic.  On discharge patient, patient was noted to be tachycardic tachypneic blood pressure was elevated and his pulse oxygen was around 89% on minimal activity.  Troponin was noted to be elevated at 8600> 8400> 8300. Chest x-ray showed cardiomegaly and pulmonary edema.  CT angiogram negative for PE.  Cardiology was consulted and patient was then considered for admission to hospital for further evaluation and treatment.,  Patient underwent cardiac  catheterization on 03/20/2021 within normal coronaries.  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 ?  ?Assessment and Plan: ?* Myocarditis (HCC) ?Chest pain with elevated troponins.   Chest x-ray with cardiomegaly.  2D echocardiogram showed reduced LV function at 25% or so.  Patient was consulted.  Patient underwent cardiac catheterization on 03/20/2021 with normal coronaries but reduced LV function at 25%.  MRI of the heart showed diffuse myopericarditis ejection fraction at 15%..  Cardiology stated to cardiac medication medications including Entresto, spironolactone, Coreg.  Patient will need to follow-up with the heart failure clinic as has been scheduled.  Currently compensated prior to discharge. ?  ?HTN (hypertension) ?Improved on Coreg and Entresto.   ?  ?Cigarette smoker ?Counseling done.  Received nicotine patch during hospitalization. ?  ?AKI (acute kidney injury) (HCC) ?With signs of fluid overload so patient received Lasix.  2D echocardiogram shows reduced LV function.  Continue to monitor renal function. ?  ?Morbid obesity with BMI of 40.0-44.9, adult (HCC) ?Patient would benefit from weight loss as outpatient. ?  ?CHF (congestive heart failure) (HCC) ?Acute systolic CHF.  Continue aspirin, Coreg, Entresto and Aldactone on discharge.  Low-salt and CHF instructions given ?  ?  ?Consultants: Cardiology ?  ?Procedures performed:  ?Cardiac catheterization on 03/20/2021 ?Cardiac MRI ? ?CHF clinic 04/01/2021: ?ASSESSMENT & PLAN: ?1. Chronic Systolic Heart Failure ?- due to acute myopericarditis/NICM  ?-  Echo (3/23): EF 25% ?- R/LHC (3/23): normal cors and well compensated filling pressures. ?- Viral panel negative ?- cMRI (3/23): LVEF 15% with markedly dilated LV, RVEF 33%, with evidence of diffuse myopericarditis. ?- NYHA I-II, volume looks good today. ?- Increase spironolactone to 25 mg daily. ?- Continue colchicine 0.3 mg bid x 3 months. ?- Continue Coreg  3.125 mg bid. ?- Continue Entresto 49/51 mg bid. ?- Continue Farxiga 10 mg daily. ?- BMET today, repeat in 1 week. ?  ?2. HTN ?- BP much improved. ?- GDMT as above. ?  ?3. H/o Hypokalemia ?- Supp K as needed. ?- Labs today. ?  ?4. Tobacco use ?- Congratulated on quitting. ?  ?5. CKD 2 ?- Labs today. ?- On SGLT2i. ?  ?6. Snoring ?- Consider sleep study when he has insurance. ?  ?7. SDOH ?- Needs PCP, given list of PCPs to establish care.  ?- Gifted a scale today. ?- Says he will have insurance starting 04/12/21. Asked  him to bring card next visit. ?  ?Follow up with APP in 4 weeks (increase Entresto, may need PRN loop) and Dr. Gala Romney in 8 weeks + echo. ? ? ?Patient has No headache, No chest pain, No abdominal pain - No Nausea, No new weakness tingling or numbness, No Cough - SOB. ? ?No problems updated. ? ?ALLERGIES: ?No Known Allergies ? ?PAST MEDICAL HISTORY: ?History reviewed. No pertinent past medical history. ? ?MEDICATIONS AT HOME: ?Prior to Admission medications   ?Medication Sig Start Date End Date Taking? Authorizing Provider  ?aspirin 81 MG EC tablet Take 1 tablet (81 mg total) by mouth daily. Swallow whole. 03/22/21   Andrey Farmer, PA-C  ?carvedilol (COREG) 3.125 MG tablet Take 1 tablet (3.125 mg total) by mouth 2 (two) times daily with a meal. 04/16/21   Anders Simmonds, PA-C  ?colchicine 0.6 MG tablet Take 1/2 tablet (0.3 mg total) by mouth 2 (two) times daily. 04/16/21   Anders Simmonds, PA-C  ?dapagliflozin propanediol (FARXIGA) 10 MG TABS tablet Take 1 tablet (10 mg total) by mouth daily before breakfast. 04/16/21   Anders Simmonds, PA-C  ?sacubitril-valsartan (ENTRESTO) 49-51 MG Take 1 tablet by mouth 2 (two) times daily. 03/25/21   Bensimhon, Bevelyn Buckles, MD  ?spironolactone (ALDACTONE) 25 MG tablet Take 1 tablet (25 mg total) by mouth daily. 04/16/21   Anders Simmonds, PA-C  ? ? ?ROS: ?Neg HEENT ?Neg resp ?Neg cardiac ?Neg GI ?Neg GU ?Neg MS ?Neg psych ?Neg neuro ? ?Objective:  ? ?Vitals:  ?  04/16/21 0907  ?BP: 112/79  ?Pulse: 90  ?Resp: 16  ?SpO2: 95%  ?Weight: 289 lb 6.4 oz (131.3 kg)  ?Height: 6' (1.829 m)  ? ?Exam ?General appearance : Awake, alert, not in any distress. Speech Clear. Not toxic looking ?HEENT: Atraumatic and Normocephalic ?Neck: Supple, no JVD. No cervical lymphadenopathy.  ?Chest: Good air entry bilaterally, CTAB.  No rales/rhonchi/wheezing ?CVS: S1 S2 regular, no murmurs.  ?Extremities: B/L Lower Ext shows minimal edema, both legs are warm to touch ?Neurology: Awake alert, and oriented X 3, CN II-XII intact, Non focal ?Skin: No Rash ? ?Data Review ?Lab Results  ?Component Value Date  ? HGBA1C 4.7 (L) 03/21/2021  ? ? ?Assessment & Plan  ? ?1. Congestive heart failure, unspecified HF chronicity, unspecified heart failure type (HCC) ?stable ?- Comprehensive metabolic panel ?- spironolactone (ALDACTONE) 25 MG tablet; Take 1 tablet (25 mg total) by mouth daily.  Dispense: 90 tablet; Refill: 1 ?- carvedilol (COREG)  3.125 MG tablet; Take 1 tablet (3.125 mg total) by mouth 2 (two) times daily with a meal.  Dispense: 180 tablet; Refill: 1 ?- dapagliflozin propanediol (FARXIGA) 10 MG TABS tablet; Take 1 tablet (10 mg total) by mouth daily before breakfast.  Dispense: 90 tablet; Refill: 1 ? ?2. AKI (acute kidney injury) (HCC) ?- Comprehensive metabolic panel ? ?3. Viral myocarditis, unspecified chronicity ?Unsure how long he will need to take this-defer to cardiology ?- Comprehensive metabolic panel ?- CBC with Differential/Platelet ?- colchicine 0.6 MG tablet; Take 1/2 tablet (0.3 mg total) by mouth 2 (two) times daily.  Dispense: 180 tablet; Refill: 1 ? ?4. Hospital discharge follow-up ?Doing great overall ? ? ? ?Patient have been counseled extensively about nutrition and exercise. Other issues discussed during this visit include: low cholesterol diet, weight control and daily exercise, foot care, annual eye examinations at Ophthalmology, importance of adherence with medications and regular  follow-up. We also discussed long term complications of uncontrolled diabetes and hypertension.  ? ?Return in about 2 months (around 06/16/2021) for assign PCP/heart failure. ? ?The patient was given clear instr

## 2021-04-17 LAB — COMPREHENSIVE METABOLIC PANEL
ALT: 16 IU/L (ref 0–44)
AST: 17 IU/L (ref 0–40)
Albumin/Globulin Ratio: 1.7 (ref 1.2–2.2)
Albumin: 4.3 g/dL (ref 4.0–5.0)
Alkaline Phosphatase: 58 IU/L (ref 44–121)
BUN/Creatinine Ratio: 10 (ref 9–20)
BUN: 14 mg/dL (ref 6–20)
Bilirubin Total: 0.6 mg/dL (ref 0.0–1.2)
CO2: 22 mmol/L (ref 20–29)
Calcium: 9.1 mg/dL (ref 8.7–10.2)
Chloride: 104 mmol/L (ref 96–106)
Creatinine, Ser: 1.46 mg/dL — ABNORMAL HIGH (ref 0.76–1.27)
Globulin, Total: 2.5 g/dL (ref 1.5–4.5)
Glucose: 83 mg/dL (ref 70–99)
Potassium: 4 mmol/L (ref 3.5–5.2)
Sodium: 140 mmol/L (ref 134–144)
Total Protein: 6.8 g/dL (ref 6.0–8.5)
eGFR: 64 mL/min/{1.73_m2} (ref >59)

## 2021-04-17 LAB — CBC WITH DIFFERENTIAL/PLATELET
Basophils Absolute: 0 10*3/uL (ref 0.0–0.2)
Basos: 1 %
EOS (ABSOLUTE): 0.2 10*3/uL (ref 0.0–0.4)
Eos: 2 %
Hematocrit: 43.1 % (ref 37.5–51.0)
Hemoglobin: 14.7 g/dL (ref 13.0–17.7)
Immature Grans (Abs): 0 10*3/uL (ref 0.0–0.1)
Immature Granulocytes: 0 %
Lymphocytes Absolute: 2.8 10*3/uL (ref 0.7–3.1)
Lymphs: 35 %
MCH: 29.6 pg (ref 26.6–33.0)
MCHC: 34.1 g/dL (ref 31.5–35.7)
MCV: 87 fL (ref 79–97)
Monocytes Absolute: 0.5 10*3/uL (ref 0.1–0.9)
Monocytes: 7 %
Neutrophils Absolute: 4.5 10*3/uL (ref 1.4–7.0)
Neutrophils: 55 %
Platelets: 209 10*3/uL (ref 150–450)
RBC: 4.96 x10E6/uL (ref 4.14–5.80)
RDW: 13.3 % (ref 11.6–15.4)
WBC: 8 10*3/uL (ref 3.4–10.8)

## 2021-04-18 ENCOUNTER — Other Ambulatory Visit: Payer: Self-pay

## 2021-04-18 NOTE — Progress Notes (Signed)
? ?ADVANCED HF CLINIC CONSULT NOTE ? ?Primary Care: none ?HF Cardiologist: Dr. Gala Romney ? ?HPI: ?Robert Daniels is a 36 y.o. with past medical history of obesity, tobacco abuse and new diagnosis of systolic heart failure/NICM. ? ?Patient was seen in the ED 3 times in 2022. He was seen in March 2022 for chest wall pain that was musculoskeletal. Was later seen in April 2022 for a viral syndrome. Most recently seen in December 2022 for RML and RLL PNA.  ?  ?Admitted 3/23 with CP and + Hs-Troponin. Echo showed EF 2-25%, global LV HK, RV ok. Underwent R/LHC showing normal cors, well-compensated filling pressures, EF 25%. cMRI with LVEF 15%, RVEF 33%, findings consistent with myopericarditis. Viral panel negative. Started on GDMT and colchicine. Discharged home, weight 282 lbs. ? ?Today he returns for HF follow up with his wife. Overall feeling fine. He has no SOB with activity or ADLs. Denies abnormal bleeding, palpitations, CP, dizziness, edema, or PND/Orthopnea. Appetite ok. No fever or chills. Weight 287-290 lbs. Taking all medications. Remains quit from smoking.  ? ?Cardiac Studies ?- cMRI (3/23): EF 15% with markedly dilated LV with evidence of diffuse myopericarditis. RV 33% ?  ?- R/LHC (3/23): normal cors EF 25%  ?RA = 3 ?RV = 32/10 ?PA = 34/20 (26) ?PCW = 17 ?Fick cardiac output/index = 6.1/2.5 ?PVR = 1.5 WU ?Ao sat = 94% ?PA sat = 68%, 68% ? ?History reviewed. No pertinent past medical history. ? ?Current Outpatient Medications  ?Medication Sig Dispense Refill  ? aspirin 81 MG EC tablet Take 1 tablet (81 mg total) by mouth daily. Swallow whole. 30 tablet 1  ? carvedilol (COREG) 3.125 MG tablet Take 1 tablet (3.125 mg total) by mouth 2 (two) times daily with a meal. 180 tablet 1  ? colchicine 0.6 MG tablet Take 0.6 mg by mouth daily.    ? dapagliflozin propanediol (FARXIGA) 10 MG TABS tablet Take 1 tablet (10 mg total) by mouth daily before breakfast. 90 tablet 1  ? sacubitril-valsartan (ENTRESTO) 49-51 MG Take 1  tablet by mouth 2 (two) times daily. 180 tablet 3  ? spironolactone (ALDACTONE) 25 MG tablet Take 1 tablet (25 mg total) by mouth daily. 90 tablet 1  ? ?No current facility-administered medications for this encounter.  ? ?No Known Allergies ? ?Social History  ? ?Socioeconomic History  ? Marital status: Single  ?  Spouse name: Not on file  ? Number of children: Not on file  ? Years of education: Not on file  ? Highest education level: Not on file  ?Occupational History  ? Not on file  ?Tobacco Use  ? Smoking status: Every Day  ?  Types: Cigars, Cigarettes  ? Smokeless tobacco: Never  ?Vaping Use  ? Vaping Use: Never used  ?Substance and Sexual Activity  ? Alcohol use: Yes  ?  Comment: occ  ? Drug use: Never  ? Sexual activity: Not on file  ?Other Topics Concern  ? Not on file  ?Social History Narrative  ? Not on file  ? ?Social Determinants of Health  ? ?Financial Resource Strain: High Risk  ? Difficulty of Paying Living Expenses: Hard  ?Food Insecurity: Not on file  ?Transportation Needs: Not on file  ?Physical Activity: Not on file  ?Stress: Not on file  ?Social Connections: Not on file  ?Intimate Partner Violence: Not on file  ? ?History reviewed. No pertinent family history. ? ?BP 118/80   Pulse 86   Wt 129.9 kg (286 lb 6.4  oz)   SpO2 99%   BMI 38.84 kg/m?  ? ?Wt Readings from Last 3 Encounters:  ?04/21/21 129.9 kg (286 lb 6.4 oz)  ?04/16/21 131.3 kg (289 lb 6.4 oz)  ?04/01/21 133.4 kg (294 lb)  ? ?PHYSICAL EXAM: ?General:  NAD. No resp difficulty ?HEENT: Normal ?Neck: Supple. No JVD. Carotids 2+ bilat; no bruits. No lymphadenopathy or thryomegaly appreciated. ?Cor: PMI nondisplaced. Regular rate & rhythm. No rubs, gallops or murmurs. ?Lungs: Clear ?Abdomen: Obese, nontender, nondistended. No hepatosplenomegaly. No bruits or masses. Good bowel sounds. ?Extremities: No cyanosis, clubbing, rash, edema ?Neuro: Alert & oriented x 3, cranial nerves grossly intact. Moves all 4 extremities w/o difficulty. Affect  pleasant. ? ?ASSESSMENT & PLAN: ?1. Chronic Systolic Heart Failure ?- due to acute myopericarditis/NICM  ?- Echo (3/23): EF 25% ?- R/LHC (3/23): normal cors and well compensated filling pressures. ?- Viral panel negative ?- cMRI (3/23): LVEF 15% with markedly dilated LV, RVEF 33%, with evidence of diffuse myopericarditis. ?- NYHA I-II, volume looks good today. ?- Increase Entresto to 97/103 mg bid. ?- Continue spironolactone 25 mg daily. ?- Continue colchicine 0.3 mg bid x 3 months. ?- Continue Coreg 3.125 mg bid. ?- Continue Farxiga 10 mg daily. ?- BMET today, repeat in 10-14 days. ?  ?2. HTN ?- BP much improved. ?- GDMT as above. ?  ?3. H/o Hypokalemia ?- Supp K as needed. ?- Labs today. ?  ?4. Tobacco use ?- Congratulated on quitting. ?  ?5. CKD 2 ?- Labs today. ?- On SGLT2i. ? ?6. Snoring ?- Consider sleep study when he has insurance. ? ?7. SDOH ?- Needs PCP, given list of PCPs to establish care.  ?- He has a scale. ?- Asked him to bring card next visit. ? ?Follow up with Dr. Gala Romney + echo as scheduled. ? ?Robert Rome, FNP-BC ?04/21/21 ? ?

## 2021-04-21 ENCOUNTER — Ambulatory Visit (HOSPITAL_COMMUNITY)
Admission: RE | Admit: 2021-04-21 | Discharge: 2021-04-21 | Disposition: A | Discharge status: Home / Self Care | Payer: Self-pay | Source: Ambulatory Visit | Attending: Family Medicine | Admitting: Family Medicine

## 2021-04-21 ENCOUNTER — Other Ambulatory Visit: Payer: Self-pay

## 2021-04-21 ENCOUNTER — Encounter (HOSPITAL_COMMUNITY): Payer: Self-pay

## 2021-04-21 VITALS — BP 118/80 | HR 86 | Wt 286.4 lb

## 2021-04-21 DIAGNOSIS — Z8639 Personal history of other endocrine, nutritional and metabolic disease: Secondary | ICD-10-CM

## 2021-04-21 DIAGNOSIS — I5022 Chronic systolic (congestive) heart failure: Secondary | ICD-10-CM | POA: Insufficient documentation

## 2021-04-21 DIAGNOSIS — E669 Obesity, unspecified: Secondary | ICD-10-CM | POA: Insufficient documentation

## 2021-04-21 DIAGNOSIS — I509 Heart failure, unspecified: Secondary | ICD-10-CM

## 2021-04-21 DIAGNOSIS — I10 Essential (primary) hypertension: Secondary | ICD-10-CM

## 2021-04-21 DIAGNOSIS — Z79899 Other long term (current) drug therapy: Secondary | ICD-10-CM | POA: Insufficient documentation

## 2021-04-21 DIAGNOSIS — Z87891 Personal history of nicotine dependence: Secondary | ICD-10-CM | POA: Insufficient documentation

## 2021-04-21 DIAGNOSIS — N182 Chronic kidney disease, stage 2 (mild): Secondary | ICD-10-CM | POA: Insufficient documentation

## 2021-04-21 DIAGNOSIS — Z6838 Body mass index (BMI) 38.0-38.9, adult: Secondary | ICD-10-CM | POA: Insufficient documentation

## 2021-04-21 DIAGNOSIS — R0683 Snoring: Secondary | ICD-10-CM | POA: Insufficient documentation

## 2021-04-21 DIAGNOSIS — E876 Hypokalemia: Secondary | ICD-10-CM | POA: Insufficient documentation

## 2021-04-21 DIAGNOSIS — I428 Other cardiomyopathies: Secondary | ICD-10-CM | POA: Insufficient documentation

## 2021-04-21 DIAGNOSIS — I130 Hypertensive heart and chronic kidney disease with heart failure and stage 1 through stage 4 chronic kidney disease, or unspecified chronic kidney disease: Secondary | ICD-10-CM | POA: Insufficient documentation

## 2021-04-21 LAB — BASIC METABOLIC PANEL
Anion gap: 5 (ref 5–15)
BUN: 11 mg/dL (ref 6–20)
CO2: 24 mmol/L (ref 22–32)
Calcium: 8.8 mg/dL — ABNORMAL LOW (ref 8.9–10.3)
Chloride: 112 mmol/L — ABNORMAL HIGH (ref 98–111)
Creatinine, Ser: 1.43 mg/dL — ABNORMAL HIGH (ref 0.61–1.24)
GFR, Estimated: >60 mL/min (ref >60)
Glucose, Bld: 112 mg/dL — ABNORMAL HIGH (ref 70–99)
Potassium: 3.9 mmol/L (ref 3.5–5.1)
Sodium: 141 mmol/L (ref 135–145)

## 2021-04-21 MED ORDER — ENTRESTO 97-103 MG PO TABS
1.0000 | ORAL_TABLET | Freq: Two times a day (BID) | ORAL | 4 refills | Status: DC
  Filled 2021-04-21: qty 60, 30d supply, fill #0

## 2021-04-21 NOTE — Patient Instructions (Addendum)
Good to see you today ? ?INCREASE Entresto to 97/103 mg  tablet Twice daily ? ?Labs done today, your results will be available in MyChart, we will contact you for abnormal readings. ? ?Your physician recommends that you schedule a follow-up appointment for lab work in 10- 14 days ? ?Keep scheduled appointment for Dr. Gala Romney for May ? ?If you have any questions or concerns before your next appointment please send [place] a message through [place] or call our office at [phone].   ? ?TO LEAVE A MESSAGE FOR THE NURSE SELECT OPTION 2, PLEASE LEAVE A MESSAGE INCLUDING: ?YOUR NAME ?DATE OF BIRTH ?CALL BACK NUMBER ?REASON FOR CALL**this is important as we prioritize the call backs ? ?YOU WILL RECEIVE A CALL BACK THE SAME DAY AS LONG AS YOU CALL BEFORE 4:00 PM ? ?At the [facility], you and your health needs are our priority. As part of our continuing mission to provide you with exceptional heart care, we have created designated Provider Care Teams. These Care Teams include your primary Cardiologist (physician) and Advanced Practice Providers (APPs- Physician Assistants and Nurse Practitioners) who all work together to provide you with the care you need, when you need it.  ? ?You may see any of the following providers on your designated Care Team at your next follow up: ?Dr Arvilla Meres ?Dr Marca Ancona ?Tonye Becket, NP ?Robbie Lis, PA ?Jessica Milford,NP ?Anna Genre, PA ?Karle Plumber, PharmD ? ? ?Please be sure to bring in all your medications bottles to every appointment.  ? ?Do the following things EVERYDAY: ?Weigh yourself in the morning before breakfast. Write it down and keep it in a log. ?Take your medicines as prescribed ?Eat low salt foods--Limit salt (sodium) to 2000 mg per day.  ?Stay as active as you can everyday ?Limit all fluids for the day to less than 2 liters ? ? ? ? ? ?

## 2021-05-01 ENCOUNTER — Other Ambulatory Visit (HOSPITAL_COMMUNITY): Payer: Self-pay

## 2021-05-05 ENCOUNTER — Other Ambulatory Visit: Payer: Self-pay

## 2021-06-09 NOTE — Progress Notes (Signed)
Patient did not show for appointment. Not left for templating purposes only.      ADVANCED HF CLINIC CONSULT NOTE  Primary Care: none HF Cardiologist: Dr. Gala Romney  HPI: Robert Daniels is a 36 y.o. 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 Echo EF 20-25%. R/LHC with normal cors, well-compensated filling pressures, EF 25%. cMRI with LVEF 15%, RVEF 33%, findings consistent with myopericarditis. Viral panel negative. Started on GDMT and colchicine. Discharged home, weight 282 lbs.  Echo today 06/11/21:   Today he returns for HF follow up with his wife.   Cardiac Studies - cMRI (3/23): EF 15% with markedly dilated LV with evidence of diffuse myopericarditis. RV 33%   - R/LHC (3/23): normal cors EF 25%  RA = 3 RV = 32/10 PA = 34/20 (26) PCW = 17 Fick cardiac output/index = 6.1/2.5 PVR = 1.5 WU Ao sat = 94% PA sat = 68%, 68%  No past medical history on file.  Current Outpatient Medications  Medication Sig Dispense Refill   aspirin 81 MG EC tablet Take 1 tablet (81 mg total) by mouth daily. Swallow whole. 30 tablet 1   carvedilol (COREG) 3.125 MG tablet Take 1 tablet (3.125 mg total) by mouth 2 (two) times daily with a meal. 180 tablet 1   colchicine 0.6 MG tablet Take 0.6 mg by mouth daily.     dapagliflozin propanediol (FARXIGA) 10 MG TABS tablet Take 1 tablet (10 mg total) by mouth daily before breakfast. 90 tablet 1   sacubitril-valsartan (ENTRESTO) 97-103 MG Take 1 tablet by mouth 2 (two) times daily. Please cancel all previous orders for current medication. Change in dosage or pill size. 60 tablet 4   spironolactone (ALDACTONE) 25 MG tablet Take 1 tablet (25 mg total) by mouth daily. 90 tablet 1   No current facility-administered medications for this encounter.   No Known Allergies  Social History   Socioeconomic History   Marital status: Single    Spouse name: Not on file   Number of  children: Not on file   Years of education: Not on file   Highest education level: Not on file  Occupational History   Not on file  Tobacco Use   Smoking status: Every Day    Types: Cigars, Cigarettes   Smokeless tobacco: Never  Vaping Use   Vaping Use: Never used  Substance and Sexual Activity   Alcohol use: Yes    Comment: occ   Drug use: Never   Sexual activity: Not on file  Other Topics Concern   Not on file  Social History Narrative   Not on file   Social Determinants of Health   Financial Resource Strain: High Risk   Difficulty of Paying Living Expenses: Hard  Food Insecurity: Not on file  Transportation Needs: Not on file  Physical Activity: Not on file  Stress: Not on file  Social Connections: Not on file  Intimate Partner Violence: Not on file   No family history on file.  There were no vitals taken for this visit.  Wt Readings from Last 3 Encounters:  04/21/21 129.9 kg (286 lb 6.4 oz)  04/16/21 131.3 kg (289 lb 6.4 oz)  04/01/21 133.4 kg (294 lb)   PHYSICAL EXAM: General:  NAD. No resp difficulty HEENT: Normal Neck: Supple. No JVD. Carotids 2+ bilat; no bruits. No lymphadenopathy or thryomegaly appreciated. Cor: PMI nondisplaced. Regular rate & rhythm. No  rubs, gallops or murmurs. Lungs: Clear Abdomen: Obese, nontender, nondistended. No hepatosplenomegaly. No bruits or masses. Good bowel sounds. Extremities: No cyanosis, clubbing, rash, edema Neuro: Alert & oriented x 3, cranial nerves grossly intact. Moves all 4 extremities w/o difficulty. Affect pleasant.  ASSESSMENT & PLAN: 1. 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 - Viral panel negative - cMRI (3/23): LVEF 15% with markedly dilated LV, RVEF 33%, with evidence of diffuse myopericarditis. - NYHA I-II, volume looks good today. - Increase Entresto to 97/103 mg bid. - Continue  spironolactone 25 mg daily. - Continue colchicine 0.3 mg bid x 3 months. - Continue Coreg 3.125 mg bid. - Continue Farxiga 10 mg daily. - BMET today, repeat in 10-14 days.   2. HTN - BP much improved. - GDMT as above.   3. H/o Hypokalemia - Supp K as needed. - Labs today.   4. Tobacco use - Congratulated on quitting.   5. CKD 2 - Labs today. - On SGLT2i.  6. Snoring - Consider sleep study when he has insurance.  7. SDOH - Needs PCP, given list of PCPs to establish care.  - He has a scale. - Asked him to bring card next visit.  Arvilla Meres, MD  9:22 PM

## 2021-06-11 ENCOUNTER — Ambulatory Visit (HOSPITAL_COMMUNITY): Admission: RE | Admit: 2021-06-11 | Payer: Self-pay | Source: Ambulatory Visit

## 2021-06-11 ENCOUNTER — Inpatient Hospital Stay (HOSPITAL_COMMUNITY)
Admission: RE | Admit: 2021-06-11 | Discharge: 2021-06-11 | Disposition: A | Discharge status: Home / Self Care | Payer: Self-pay | Source: Ambulatory Visit | Attending: Internal Medicine | Admitting: Internal Medicine

## 2021-06-11 DIAGNOSIS — I5022 Chronic systolic (congestive) heart failure: Secondary | ICD-10-CM

## 2021-06-17 ENCOUNTER — Other Ambulatory Visit: Payer: Self-pay

## 2021-06-18 ENCOUNTER — Ambulatory Visit (HOSPITAL_COMMUNITY)
Admission: RE | Admit: 2021-06-18 | Discharge: 2021-06-18 | Disposition: A | Discharge status: Home / Self Care | Payer: Self-pay | Source: Ambulatory Visit | Attending: Cardiology | Admitting: Cardiology

## 2021-06-18 DIAGNOSIS — I509 Heart failure, unspecified: Secondary | ICD-10-CM | POA: Insufficient documentation

## 2021-06-18 LAB — BASIC METABOLIC PANEL
Anion gap: 5 (ref 5–15)
BUN: 8 mg/dL (ref 6–20)
CO2: 24 mmol/L (ref 22–32)
Calcium: 8.8 mg/dL — ABNORMAL LOW (ref 8.9–10.3)
Chloride: 111 mmol/L (ref 98–111)
Creatinine, Ser: 1.38 mg/dL — ABNORMAL HIGH (ref 0.61–1.24)
GFR, Estimated: >60 mL/min (ref >60)
Glucose, Bld: 93 mg/dL (ref 70–99)
Potassium: 4 mmol/L (ref 3.5–5.1)
Sodium: 140 mmol/L (ref 135–145)

## 2021-07-21 ENCOUNTER — Other Ambulatory Visit: Payer: Self-pay

## 2021-08-27 ENCOUNTER — Encounter (HOSPITAL_COMMUNITY): Payer: Self-pay

## 2021-08-27 ENCOUNTER — Observation Stay (HOSPITAL_BASED_OUTPATIENT_CLINIC_OR_DEPARTMENT_OTHER)
Admission: EM | Admit: 2021-08-27 | Discharge: 2021-08-29 | Disposition: A | Discharge status: Home / Self Care | Payer: No Typology Code available for payment source | Attending: Internal Medicine | Admitting: Internal Medicine

## 2021-08-27 ENCOUNTER — Other Ambulatory Visit: Payer: Self-pay

## 2021-08-27 ENCOUNTER — Emergency Department (HOSPITAL_BASED_OUTPATIENT_CLINIC_OR_DEPARTMENT_OTHER): Payer: No Typology Code available for payment source

## 2021-08-27 ENCOUNTER — Encounter (HOSPITAL_BASED_OUTPATIENT_CLINIC_OR_DEPARTMENT_OTHER): Payer: Self-pay | Admitting: Emergency Medicine

## 2021-08-27 DIAGNOSIS — Z7982 Long term (current) use of aspirin: Secondary | ICD-10-CM | POA: Diagnosis not present

## 2021-08-27 DIAGNOSIS — Z7984 Long term (current) use of oral hypoglycemic drugs: Secondary | ICD-10-CM | POA: Diagnosis not present

## 2021-08-27 DIAGNOSIS — Z79899 Other long term (current) drug therapy: Secondary | ICD-10-CM | POA: Diagnosis not present

## 2021-08-27 DIAGNOSIS — Z20822 Contact with and (suspected) exposure to covid-19: Secondary | ICD-10-CM | POA: Diagnosis not present

## 2021-08-27 DIAGNOSIS — R7989 Other specified abnormal findings of blood chemistry: Secondary | ICD-10-CM | POA: Diagnosis present

## 2021-08-27 DIAGNOSIS — Z87891 Personal history of nicotine dependence: Secondary | ICD-10-CM | POA: Insufficient documentation

## 2021-08-27 DIAGNOSIS — R778 Other specified abnormalities of plasma proteins: Secondary | ICD-10-CM | POA: Insufficient documentation

## 2021-08-27 DIAGNOSIS — I10 Essential (primary) hypertension: Secondary | ICD-10-CM | POA: Diagnosis present

## 2021-08-27 DIAGNOSIS — I130 Hypertensive heart and chronic kidney disease with heart failure and stage 1 through stage 4 chronic kidney disease, or unspecified chronic kidney disease: Secondary | ICD-10-CM | POA: Diagnosis not present

## 2021-08-27 DIAGNOSIS — N182 Chronic kidney disease, stage 2 (mild): Secondary | ICD-10-CM

## 2021-08-27 DIAGNOSIS — I5023 Acute on chronic systolic (congestive) heart failure: Secondary | ICD-10-CM | POA: Diagnosis not present

## 2021-08-27 DIAGNOSIS — N179 Acute kidney failure, unspecified: Secondary | ICD-10-CM | POA: Diagnosis not present

## 2021-08-27 DIAGNOSIS — I509 Heart failure, unspecified: Secondary | ICD-10-CM

## 2021-08-27 DIAGNOSIS — N1831 Chronic kidney disease, stage 3a: Secondary | ICD-10-CM | POA: Diagnosis not present

## 2021-08-27 DIAGNOSIS — R0602 Shortness of breath: Secondary | ICD-10-CM | POA: Diagnosis present

## 2021-08-27 HISTORY — DX: Gout, unspecified: M10.9

## 2021-08-27 HISTORY — DX: Heart failure, unspecified: I50.9

## 2021-08-27 HISTORY — DX: Acute kidney failure, unspecified: N17.9

## 2021-08-27 LAB — CBC WITH DIFFERENTIAL/PLATELET
Abs Immature Granulocytes: 0.03 10*3/uL (ref 0.00–0.07)
Basophils Absolute: 0.1 10*3/uL (ref 0.0–0.1)
Basophils Relative: 1 %
Eosinophils Absolute: 0.2 10*3/uL (ref 0.0–0.5)
Eosinophils Relative: 1 %
HCT: 39.6 % (ref 39.0–52.0)
Hemoglobin: 13.8 g/dL (ref 13.0–17.0)
Immature Granulocytes: 0 %
Lymphocytes Relative: 15 %
Lymphs Abs: 2 10*3/uL (ref 0.7–4.0)
MCH: 31.3 pg (ref 26.0–34.0)
MCHC: 34.8 g/dL (ref 30.0–36.0)
MCV: 89.8 fL (ref 80.0–100.0)
Monocytes Absolute: 0.6 10*3/uL (ref 0.1–1.0)
Monocytes Relative: 5 %
Neutro Abs: 9.8 10*3/uL — ABNORMAL HIGH (ref 1.7–7.7)
Neutrophils Relative %: 78 %
Platelets: 249 10*3/uL (ref 150–400)
RBC: 4.41 MIL/uL (ref 4.22–5.81)
RDW: 13.7 % (ref 11.5–15.5)
WBC: 12.7 10*3/uL — ABNORMAL HIGH (ref 4.0–10.5)
nRBC: 0 % (ref 0.0–0.2)

## 2021-08-27 LAB — BRAIN NATRIURETIC PEPTIDE: B Natriuretic Peptide: 974 pg/mL — ABNORMAL HIGH (ref 0.0–100.0)

## 2021-08-27 LAB — BASIC METABOLIC PANEL
Anion gap: 7 (ref 5–15)
BUN: 12 mg/dL (ref 6–20)
CO2: 23 mmol/L (ref 22–32)
Calcium: 8.6 mg/dL — ABNORMAL LOW (ref 8.9–10.3)
Chloride: 109 mmol/L (ref 98–111)
Creatinine, Ser: 1.49 mg/dL — ABNORMAL HIGH (ref 0.61–1.24)
GFR, Estimated: >60 mL/min (ref >60)
Glucose, Bld: 105 mg/dL — ABNORMAL HIGH (ref 70–99)
Potassium: 3.9 mmol/L (ref 3.5–5.1)
Sodium: 139 mmol/L (ref 135–145)

## 2021-08-27 LAB — RESPIRATORY PANEL BY PCR

## 2021-08-27 LAB — RESP PANEL BY RT-PCR (FLU A&B, COVID) ARPGX2
Influenza A by PCR: NEGATIVE
Influenza B by PCR: NEGATIVE
SARS Coronavirus 2 by RT PCR: NEGATIVE

## 2021-08-27 LAB — TROPONIN I (HIGH SENSITIVITY)
Troponin I (High Sensitivity): 24 ng/L — ABNORMAL HIGH (ref <18)
Troponin I (High Sensitivity): 20 ng/L — ABNORMAL HIGH (ref <18)

## 2021-08-27 LAB — MAGNESIUM: Magnesium: 2 mg/dL (ref 1.7–2.4)

## 2021-08-27 LAB — PROCALCITONIN: Procalcitonin: <0.1 ng/mL

## 2021-08-27 MED ORDER — FUROSEMIDE 10 MG/ML IJ SOLN
40.0000 mg | Freq: Once | INTRAMUSCULAR | Status: AC
  Administered 2021-08-27: 40 mg via INTRAVENOUS
  Filled 2021-08-27: qty 4

## 2021-08-27 MED ORDER — SODIUM CHLORIDE 0.9% FLUSH
3.0000 mL | INTRAVENOUS | Status: DC | PRN
  Administered 2021-08-28: 3 mL via INTRAVENOUS

## 2021-08-27 MED ORDER — ACETAMINOPHEN 650 MG RE SUPP: 650.0000 mg | Freq: Four times a day (QID) | RECTAL | Status: DC | PRN

## 2021-08-27 MED ORDER — FUROSEMIDE 10 MG/ML IJ SOLN
40.0000 mg | Freq: Every day | INTRAMUSCULAR | Status: DC
  Administered 2021-08-28: 40 mg via INTRAVENOUS

## 2021-08-27 MED ORDER — SODIUM CHLORIDE 0.9% FLUSH
3.0000 mL | Freq: Two times a day (BID) | INTRAVENOUS | Status: DC
  Administered 2021-08-28: 3 mL via INTRAVENOUS

## 2021-08-27 MED ORDER — DAPAGLIFLOZIN PROPANEDIOL 10 MG PO TABS
10.0000 mg | ORAL_TABLET | Freq: Every day | ORAL | Status: DC
  Administered 2021-08-28 – 2021-08-29 (×2): 10 mg via ORAL
  Filled 2021-08-27 (×2): qty 1

## 2021-08-27 MED ORDER — ACETAMINOPHEN 325 MG PO TABS: 650.0000 mg | ORAL_TABLET | Freq: Four times a day (QID) | ORAL | Status: DC | PRN

## 2021-08-27 MED ORDER — SODIUM CHLORIDE 0.9 % IV SOLN: 250.0000 mL | INTRAVENOUS | Status: DC | PRN

## 2021-08-27 MED ORDER — SPIRONOLACTONE 12.5 MG HALF TABLET
12.5000 mg | ORAL_TABLET | Freq: Every day | ORAL | Status: DC
  Administered 2021-08-28 – 2021-08-29 (×2): 12.5 mg via ORAL
  Filled 2021-08-27 (×2): qty 1

## 2021-08-27 MED ORDER — CARVEDILOL 3.125 MG PO TABS
3.1250 mg | ORAL_TABLET | Freq: Two times a day (BID) | ORAL | Status: DC
  Administered 2021-08-27 – 2021-08-29 (×4): 3.125 mg via ORAL
  Filled 2021-08-27 (×4): qty 1

## 2021-08-27 NOTE — Assessment & Plan Note (Signed)
Flat troponin with no chest pain or ekg changes, not characteristic of ACS and likely secondary to demand ischemia in setting of acute on chronic systolic CHF exacerbation Continue telemetry

## 2021-08-27 NOTE — ED Notes (Signed)
Report to Nat

## 2021-08-27 NOTE — Assessment & Plan Note (Signed)
Baseline creatinine of 1.3-1.4 since his initial diagnosis of myocarditis/CHF Stable at this time, monitor with diuresis

## 2021-08-27 NOTE — ED Provider Notes (Signed)
[facility] EMERGENCY DEPARTMENT Provider Note   CSN: [id number] Arrival date & time: 08/27/21  1135     History  Chief Complaint  Patient presents with   Shortness of Breath    Robert Daniels is a 36 y.o. male.  36 year old male with past medical history of CHF, AKI, myocarditis presents with complaint of midsternal chest tightness and shortness of breath ongoing for the past 4 days, not worsening but not improving.  He denies any associated fevers, chills, nausea, vomiting or lower extremity edema.  Reports he has a productive cough with sputum that is sometimes green/yellow/orange.  Reports compliance with his medications from his March admission including his spironolactone.       Home Medications Prior to Admission medications   Medication Sig Start Date End Date Taking? Authorizing Provider  aspirin EC 81 MG tablet Take 1 tablet (81 mg total) by mouth daily. Swallow whole. 03/22/21   Andrey Farmer, PA-C  carvedilol (COREG) 3.125 MG tablet Take 1 tablet (3.125 mg total) by mouth 2 (two) times daily with a meal. 04/16/21   McClung, Marzella Schlein, PA-C  colchicine 0.6 MG tablet Take 0.6 mg by mouth daily.    [provider]  dapagliflozin propanediol (FARXIGA) 10 MG TABS tablet Take 1 tablet (10 mg total) by mouth daily before breakfast. 04/16/21   McClung, Marzella Schlein, PA-C  sacubitril-valsartan (ENTRESTO) 97-103 MG Take 1 tablet by mouth 2 (two) times daily. Please cancel all previous orders for current medication. Change in dosage or pill size. 04/21/21   Milford, Anderson Malta, FNP  spironolactone (ALDACTONE) 25 MG tablet Take 1 tablet (25 mg total) by mouth daily. 04/16/21   Anders Simmonds, PA-C      Allergies    Patient has no known allergies.    Review of Systems   Review of Systems Negative except as per hpi Physical Exam Updated Vital Signs BP (!) 132/105   Pulse 87   Temp 99.6 F (37.6 C) (Oral)   Resp (!) 25   Ht 6' (1.829 m)   Wt 127 kg    SpO2 97%   BMI 37.97 kg/m  Physical Exam Vitals and nursing note reviewed.  Constitutional:      General: He is not in acute distress.    Appearance: He is well-developed. He is ill-appearing. He is not diaphoretic.  HENT:     Head: Normocephalic and atraumatic.  Cardiovascular:     Rate and Rhythm: Regular rhythm. Tachycardia present.     Heart sounds: No murmur heard. Pulmonary:     Effort: Pulmonary effort is normal. Tachypnea present. No respiratory distress.     Breath sounds: Examination of the right-lower field reveals rhonchi. Examination of the left-lower field reveals rhonchi. Rhonchi present.  Chest:     Chest wall: No tenderness.  Abdominal:     Palpations: Abdomen is soft.     Tenderness: There is no abdominal tenderness.  Musculoskeletal:     Right lower leg: No tenderness. No edema.     Left lower leg: No tenderness. No edema.  Skin:    General: Skin is warm and dry.  Neurological:     Mental Status: He is alert and oriented to person, place, and time.  Psychiatric:        Behavior: Behavior normal.     ED Results / Procedures / Treatments   Labs (all labs ordered are listed, but only abnormal results are displayed) Labs Reviewed  BASIC METABOLIC PANEL -  Abnormal; Notable for the following components:      Result Value   Glucose, Bld 105 (*)    Creatinine, Ser 1.49 (*)    Calcium 8.6 (*)    All other components within normal limits  CBC WITH DIFFERENTIAL/PLATELET - Abnormal; Notable for the following components:   WBC 12.7 (*)    Neutro Abs 9.8 (*)    All other components within normal limits  BRAIN NATRIURETIC PEPTIDE - Abnormal; Notable for the following components:   B Natriuretic Peptide 974.0 (*)    All other components within normal limits  TROPONIN I (HIGH SENSITIVITY) - Abnormal; Notable for the following components:   Troponin I (High Sensitivity) 24 (*)    All other components within normal limits  TROPONIN I (HIGH SENSITIVITY) - Abnormal;  Notable for the following components:   Troponin I (High Sensitivity) 20 (*)    All other components within normal limits  RESP PANEL BY RT-PCR (FLU A&B, COVID) [id number]    EKG EKG Interpretation  Date/Time:  Wednesday August 27 2021 11:54:16 EDT Ventricular Rate:  98 PR Interval:  168 QRS Duration: 86 QT Interval:  378 QTC Calculation: 482 R Axis:   60 Text Interpretation: Normal sinus rhythm Cannot rule out Anterior infarct , age undetermined Abnormal ECG No significant change since last tracing Confirmed by Melene Plan [phone]) on 08/27/2021 3:31:34 PM  Radiology DG Chest 2 View  Result Date: 08/27/2021 CLINICAL DATA:  Shortness of breath EXAM: CHEST - 2 VIEW COMPARISON:  03/20/2021 FINDINGS: Cardiomegaly. Diffuse bilateral heterogeneous and interstitial opacity. The visualized skeletal structures are unremarkable. IMPRESSION: Cardiomegaly with diffuse bilateral heterogeneous and interstitial opacity, which may reflect edema or infection. Electronically Signed   By: Jearld Lesch M.D.   On: 08/27/2021 12:12    Procedures Procedures    Medications Ordered in ED Medications  furosemide (LASIX) injection 40 mg (40 mg Intravenous Given 08/27/21 1341)    ED Course/ Medical Decision Making/ A&P                           Medical Decision Making Amount and/or Complexity of Data Reviewed Labs: ordered. Radiology: ordered.  Risk Prescription drug management. Decision regarding hospitalization.   This patient presents to the ED for concern of shortness of breath, cough, chest tightness, this involves an extensive number of treatment options, and is a complaint that carries with it a high risk of complications and morbidity.  The differential diagnosis includes but not limited to CHF, ACS, COPD, pneumonia, COVID   Co morbidities that complicate the patient evaluation  Acute systolic heart failure, AKI, NSTEMI   Additional history obtained:  Additional history obtained from  family member at bedside who contributes to history as above External records from outside source obtained and reviewed including discharge summary from 03/22/2021 where patient was admitted with myocarditis, acute systolic heart failure, AKI.  He was found to have elevated troponins, chest x-ray showed cardiomegaly and pulmonary edema, CTA chest negative for PE.  Did have cardiac catheterization on March 20, 2021 with normal coronaries.  Cardiac MRI showing myopericarditis with LVEF of 15%.   Lab Tests:  I Ordered, and personally interpreted labs.  The pertinent results include: CBC with mild leukocytosis, white count 12.7 with increase in neutrophils.  COVID and flu negative.  Initial troponin is 24, will trend.  BNP elevated at 974.  BMP with slight increase in creatinine to 1.49.   Imaging Studies ordered:  I  ordered imaging studies including chest x-ray I independently visualized and interpreted imaging which showed cardia may bleed, edema versus infection. I agree with the radiologist interpretation   Cardiac Monitoring: / EKG:  The patient was maintained on a cardiac monitor.  I personally viewed and interpreted the cardiac monitored which showed an underlying rhythm of: Sinus rhythm, rate 98   Consultations Obtained:  I requested consultation with the specialist, Dr. Artis Flock,  and discussed lab and imaging findings as well as pertinent plan - they recommend: Admit to medical telemetry, recommends recording strict in and out   Problem List / ED Course / Critical interventions / Medication management  36 year old male with history of NSTEMI, CHF, AKI presents with complaint of shortness of breath, feels like he has fluid in his lungs.  He notes a cough which is productive with green/yellow/orange sputum at times without lower extremity edema.  He notes he is also having midsternal chest tightness.  His symptoms started 4 days ago, are not worsening but are not improving.  He reports  compliance with his medications including his spironolactone.  On exam, he is comfortable in the bed on his cell phone although visibly tachypneic.  He has coarse lung sounds without significant lower extremity edema.  O2 sat is 93% at rest.  Patient was given IV Lasix and labs were obtained.  Chest x-ray is concerning for overload.  On recheck, patient is feeling somewhat improved, is voiding.  His O2 sat dropped to 89% while getting out of bed to use the urinal.  Recommend admission for further diuresis, monitoring.  Patient is placed on 2 L nasal cannula for his elevated respiratory rate and low sats at rest (90-93%).  Discussed with hospitalist who will admit.  Plan discussed with patient who verbalizes understanding. Labs are reviewed, his BNP is elevated at 974.  Mild leukocytosis white count of 12.7 with increase in neutrophils, denies fevers.  COVID and flu negative.  Initial troponin is 24, significantly lower than his prior admission however will continue to trend.  Creatinine is slightly elevated at 1.49, potassium normal. Case discussed with ED attending who agrees with plan for admission for diuresis. I ordered medication including Lasix for CHF  Reevaluation of the patient after these medicines showed that the patient feels improved, O2 sats 89% with standing to void, remains tachypneic I have reviewed the patients home medicines and have made adjustments as needed   Social Determinants of Health:  Lives with family   Test / Admission - Considered:  Admit for further monitoring and management.         Final Clinical Impression(s) / ED Diagnoses Final diagnoses:  Acute on chronic congestive heart failure, unspecified heart failure type [facility])    Rx / DC Orders ED Discharge Orders     None         Jeannie Fend, PA-C 08/27/21 1648    Mardene Sayer, MD 08/27/21 2210

## 2021-08-27 NOTE — Assessment & Plan Note (Addendum)
Blood pressures have been soft-normal Continue coreg 3.125mg  BID Continue aldactone but decrease to 12.5 mg daily If bp allows, add back entresto

## 2021-08-27 NOTE — ED Notes (Signed)
Report given to [facility], ETA 10-15 min

## 2021-08-27 NOTE — Progress Notes (Signed)
Plan of Care Note for accepted transfer   Patient: Robert Daniels MRN: [id number]   DOA: 08/27/2021  Facility requesting transfer: [facility] Requesting Provider: Dr. Oretha Milch PA Reason for transfer: CHF exacerbation Facility course: 36 year old with history of systolic CHF with EF of 20-25% in 03/2021 due to myocarditis who presented to ED with complaints of four days of worsening shortness of breath. He has been compliant on meds outpatient. He does have productive cough, but no fever. He doesn't have LE edema. He states he feels like he has fluid back in his lungs.   Vitals: stable, tachypnea. Does de-sat with standing/urination to 88%.  Labs: bnp: 974, creatinine: 1.49 (1.3-1.4) troponin 24>pending, covid: negative CXR: cardiomegaly with diffuse bilateral heterogeneous and interstitial opacity.  In ED: given 40mg  IV lasix. [facility] asked to admit.   Plan of care: The patient is accepted for admission to Telemetry unit, at [facility]..    Author: [facility], MD 08/27/2021  Check [email or web address] for on-call coverage.  Nursing staff, Please call [facility] Admits & Consults System-Wide number on Amion as soon as patient's arrival, so appropriate admitting provider can evaluate the pt.

## 2021-08-27 NOTE — ED Notes (Signed)
Pt up to [facility]e [facility] , [facility] here to get pt

## 2021-08-27 NOTE — Assessment & Plan Note (Addendum)
36 year old presenting with 2 day history of worsening shortness of breath, productive cough and chest tightness found to have elevated BNP, tachypnea and findings on CXR of edema>acute on chronic systolic CHF exacerbation -obs to telemetry -history of myocarditis induced systolic CHF in 03/2021 with EF of 20-25%. Confirmed by cardiac MRI. Heart cath with normal coronary arteries.  Has not followed up with cardiology and no showed appointment. Is no longer taking entresto, but still on coreg, farxiga and aldactone. Blood pressure is soft. Will decrease his aldactone to 12.5mg , continue coreg and farxiga and if bp allows can add back entresto -repeat echo pending -strict I/O and daily weights -responded to 40mg  IV lasix and feels back to baseline. Will continue this daily for now  -productive cough, likely secondary to CHF exacerbation but will check PCT/RVP, BC if has fever. covid negative.  -needs close f/u outpatient cardiology. States he has an appointment in September with them on 9/20.  -stop colchicine, completed 3 month course, but continuing to take this

## 2021-08-27 NOTE — H&P (Signed)
History and Physical    Patient: Robert Daniels [id number] DOB: March 21, 1985 DOA: 08/27/2021 DOS: the patient was seen and examined on 08/27/2021 PCP: Pcp, No  Patient coming from:  [facility]  - lives alone.   Chief Complaint: shortness of breath, cough   HPI: Robert Daniels is a 36 y.o. 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 He also states he wasn't able to lie flat and would cough and become short of breath. He denies any increased leg swelling. He denies any weight gain, he has actually lost 10 pounds. He denies any sick contacts. He has not missed any medication at home. He states his cough has been x 2 days and is productive. 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 Has not been taking entresto, but has been on his coreg, spironolactone and farxiga.    Denies any fever/chills, vision changes/headaches, chest pain or palpitations abdominal pain, N/V/D, dysuria or leg swelling.   He does not smoke, socially drinks alcohol.   ER Course:  vitals: afebrile, bp: 134/105, HR: 102, RR: 22, oxygen: 93%RA Pertinent labs: wbc: 12.7, creatinine 1.49, BNP: 974, troponin 24>20 Covid: negative  Cxr: Cardiomegaly with diffuse bilateral heterogeneous and interstitial opacity, which may reflect edema or infection.   Review of Systems: As mentioned in the history of present illness. All other systems reviewed and are negative. Past Medical History:  Diagnosis Date   AKI (acute kidney injury) ([place])    CHF (congestive heart failure) ([place])    Gout    Past Surgical History:  Procedure Laterality Date   RIGHT/LEFT HEART CATH AND CORONARY ANGIOGRAPHY N/A 03/20/2021   Procedure: RIGHT/LEFT HEART CATH AND CORONARY ANGIOGRAPHY;  Surgeon: Jolaine Artist, MD;   Location: [place] [facility];  Service: Cardiovascular;  Laterality: N/A;   Social History:  reports that he has quit smoking. His smoking use included cigars and cigarettes. He has never used smokeless tobacco. He reports current alcohol use. He reports that he does not use drugs.  No Known Allergies  No family history on file.  Prior to Admission medications   Medication Sig Start Date End Date Taking? Authorizing Provider  aspirin EC 81 MG tablet Take 1 tablet (81 mg total) by mouth daily. Swallow whole. 03/22/21   Joette Catching, PA-C  carvedilol (COREG) 3.125 MG tablet Take 1 tablet (3.125 mg total) by mouth 2 (two) times daily with a meal. 04/16/21   McClung, Dionne Bucy, PA-C  colchicine 0.6 MG tablet Take 0.6 mg by mouth daily.    [provider]  dapagliflozin propanediol (FARXIGA) 10 MG TABS tablet Take 1 tablet (10 mg total) by mouth daily before breakfast. 04/16/21   McClung, Dionne Bucy, PA-C  sacubitril-valsartan (ENTRESTO) 97-103 MG Take 1 tablet by mouth 2 (two) times daily. Please cancel all previous orders for current medication. Change in dosage or pill size. 04/21/21   Milford, Maricela Bo, FNP  spironolactone (ALDACTONE) 25 MG tablet Take 1 tablet (25 mg total) by mouth daily. 04/16/21   Argentina Donovan, PA-C    Physical Exam: Vitals:   08/27/21 1500 08/27/21 1530 08/27/21 1545 08/27/21 1658  BP: (!) 127/98 (!) 132/105  114/88  Pulse: 88 87  88  Resp: (!) 36 (!) 25  20  Temp:   99.6 F (37.6 C) 98.8 F (37.1 C)  TempSrc:   Oral Oral  SpO2: 95% 97%  94%  Weight:      Height:       General:  Appears calm and comfortable and is in NAD Eyes:  PERRL, EOMI, normal lids, iris ENT:  grossly normal hearing, lips & tongue, mmm; appropriate dentition Neck:  no LAD, masses or thyromegaly; no carotid bruits Cardiovascular:  RRR, no m/r/g. No LE edema.  Respiratory:   CTA bilaterally with no wheezes/rales/rhonchi.  Normal respiratory effort. Abdomen:  soft, NT, ND,  NABS Back:   normal alignment, no CVAT Skin:  no rash or induration seen on limited exam Musculoskeletal:  grossly normal tone BUE/BLE, good ROM, no bony abnormality Lower extremity:  No LE edema.  Limited foot exam with no ulcerations.  2+ distal pulses. Psychiatric:  grossly normal mood and affect, speech fluent and appropriate, AOx3 Neurologic:  CN 2-12 grossly intact, moves all extremities in coordinated fashion, sensation intact   Radiological Exams on Admission: Independently reviewed - see discussion in A/P where applicable  DG Chest 2 View  Result Date: 08/27/2021 CLINICAL DATA:  Shortness of breath EXAM: CHEST - 2 VIEW COMPARISON:  03/20/2021 FINDINGS: Cardiomegaly. Diffuse bilateral heterogeneous and interstitial opacity. The visualized skeletal structures are unremarkable. IMPRESSION: Cardiomegaly with diffuse bilateral heterogeneous and interstitial opacity, which may reflect edema or infection. Electronically Signed   By: Jearld Lesch M.D.   On: 08/27/2021 12:12    EKG: Independently reviewed.  NSR with rate 98; nonspecific ST changes with no evidence of acute ischemia   Labs on Admission: I have personally reviewed the available labs and imaging studies at the time of the admission.  Pertinent labs:    wbc: 12.7,  creatinine 1.49,  BNP: 974,  troponin 24>20  Assessment and Plan: Principal Problem:   Acute on chronic systolic (congestive) heart failure (HCC) Active Problems:   Elevated troponin   HTN (hypertension)   CKD (chronic kidney disease) stage 2, GFR 60-89 ml/min    Assessment and Plan: * Acute on chronic systolic (congestive) heart failure (HCC) 36 year old presenting with 2 day history of worsening shortness of breath, productive cough and chest tightness found to have elevated BNP, tachypnea and findings on CXR of edema>acute on chronic systolic CHF exacerbation -obs to telemetry -history of myocarditis induced systolic CHF in 03/2021 with EF of 20-25%.  Confirmed by cardiac MRI. Heart cath with normal coronary arteries.  Has not followed up with cardiology and no showed appointment. Is no longer taking entresto, but still on coreg, farxiga and aldactone. Blood pressure is soft. Will decrease his aldactone to 12.5mg , continue coreg and farxiga and if bp allows can add back entresto -repeat echo pending -strict I/O and daily weights -responded to 40mg  IV lasix and feels back to baseline. Will continue this daily for now  -productive cough, likely secondary to CHF exacerbation but will check PCT/RVP, BC if has fever. covid negative.  -needs close f/u outpatient cardiology. States he has an appointment in September with them on 9/20.  -stop colchicine, completed 3 month course, but continuing to take this    Elevated troponin Flat troponin with no chest pain or ekg changes, not characteristic of ACS and likely secondary to demand ischemia in setting of acute on chronic systolic CHF exacerbation Continue telemetry   HTN (hypertension) Blood pressures have been soft-normal Continue coreg 3.125mg  BID Continue aldactone but decrease to 12.5 mg daily  If bp allows, add back entresto   CKD (chronic kidney disease) stage 2, GFR 60-89 ml/min Baseline creatinine of 1.3-1.4 since his initial diagnosis of myocarditis/CHF Stable at this time, monitor with diuresis      Advance Care Planning:   Code Status: Full Code   Consults: none  DVT Prophylaxis: TED/ambulation   Family Communication: friend at bedside   Severity of Illness: The appropriate patient status for this patient is OBSERVATION. Observation status is judged to be reasonable and necessary in order to provide the required intensity of service to ensure the patient's safety. The patient's presenting symptoms, physical exam findings, and initial radiographic and laboratory data in the context of their medical condition is felt to place them at decreased risk for further clinical  deterioration. Furthermore, it is anticipated that the patient will be medically stable for discharge from the hospital within 2 midnights of admission.   Author: Orland Mustard, MD 08/27/2021 6:40 PM  For on call review [email or web address].

## 2021-08-27 NOTE — ED Triage Notes (Signed)
Pt reports chest tightness x 4d; now has cough and SHOB as well

## 2021-08-28 ENCOUNTER — Observation Stay (HOSPITAL_BASED_OUTPATIENT_CLINIC_OR_DEPARTMENT_OTHER): Payer: No Typology Code available for payment source

## 2021-08-28 ENCOUNTER — Other Ambulatory Visit (HOSPITAL_COMMUNITY): Payer: Self-pay

## 2021-08-28 DIAGNOSIS — I5023 Acute on chronic systolic (congestive) heart failure: Secondary | ICD-10-CM | POA: Diagnosis not present

## 2021-08-28 LAB — ECHOCARDIOGRAM COMPLETE
AR max vel: 3.44 cm2
AV Area VTI: 3.5 cm2
AV Area mean vel: 3.13 cm2
AV Mean grad: 2 mmHg
AV Peak grad: 4.1 mmHg
Ao pk vel: 1.01 m/s
Height: 72 in
S' Lateral: 6.3 cm
Single Plane A4C EF: 23.7 %
Weight: 4412.8 oz

## 2021-08-28 LAB — CBC
HCT: 37.9 % — ABNORMAL LOW (ref 39.0–52.0)
Hemoglobin: 13.4 g/dL (ref 13.0–17.0)
MCH: 31.5 pg (ref 26.0–34.0)
MCHC: 35.4 g/dL (ref 30.0–36.0)
MCV: 89 fL (ref 80.0–100.0)
Platelets: 226 10*3/uL (ref 150–400)
RBC: 4.26 MIL/uL (ref 4.22–5.81)
RDW: 13.8 % (ref 11.5–15.5)
WBC: 9.1 10*3/uL (ref 4.0–10.5)
nRBC: 0 % (ref 0.0–0.2)

## 2021-08-28 LAB — BASIC METABOLIC PANEL
Anion gap: 9 (ref 5–15)
BUN: 15 mg/dL (ref 6–20)
CO2: 24 mmol/L (ref 22–32)
Calcium: 8.6 mg/dL — ABNORMAL LOW (ref 8.9–10.3)
Chloride: 107 mmol/L (ref 98–111)
Creatinine, Ser: 1.61 mg/dL — ABNORMAL HIGH (ref 0.61–1.24)
GFR, Estimated: 56 mL/min — ABNORMAL LOW (ref >60)
Glucose, Bld: 99 mg/dL (ref 70–99)
Potassium: 3.6 mmol/L (ref 3.5–5.1)
Sodium: 140 mmol/L (ref 135–145)

## 2021-08-28 MED ORDER — SACUBITRIL-VALSARTAN 49-51 MG PO TABS
1.0000 | ORAL_TABLET | Freq: Two times a day (BID) | ORAL | Status: DC
  Administered 2021-08-28 – 2021-08-29 (×3): 1 via ORAL
  Filled 2021-08-28 (×3): qty 1

## 2021-08-28 MED ORDER — FUROSEMIDE 10 MG/ML IJ SOLN
40.0000 mg | Freq: Two times a day (BID) | INTRAMUSCULAR | Status: DC
  Filled 2021-08-28: qty 4

## 2021-08-28 MED ORDER — ENOXAPARIN SODIUM 60 MG/0.6ML IJ SOSY
60.0000 mg | PREFILLED_SYRINGE | INTRAMUSCULAR | Status: DC
  Administered 2021-08-28: 60 mg via SUBCUTANEOUS
  Filled 2021-08-28: qty 0.6

## 2021-08-28 MED ORDER — FUROSEMIDE 20 MG PO TABS
20.0000 mg | ORAL_TABLET | Freq: Every day | ORAL | Status: DC
  Administered 2021-08-29: 20 mg via ORAL
  Filled 2021-08-28: qty 1

## 2021-08-28 MED ORDER — PERFLUTREN LIPID MICROSPHERE
1.0000 mL | INTRAVENOUS | Status: AC | PRN
  Administered 2021-08-28: 2 mL via INTRAVENOUS

## 2021-08-28 NOTE — Consult Note (Addendum)
Advanced Heart Failure Team Consult Note   Primary Physician: Pcp, No HF Cardiologist:  Dr Leory Plowman   Reason for Consultation: HFrEF  HPI:    Robert Daniels is seen today for evaluation of HFrEf  at the request of Dr Jomarie Longs.   Robert Daniels is a 36 year old with history obesity, tobacco abuse and HFrEF due to NICM.   Admitted 03/2021 with chest pain in the setting of acute myopericarditis and severely reduced EF 15%. Cath with normal cors. MRI : LVEF 15% with markedly dilated LV, RVEF 33%, with evidence of diffuse myopericarditis. Started GDMT and d/c on colchicine x 3 months.   Last seen in the HF clinic in April. Entresto was increased to 97-103 mg twice a day.  Out of entresto for the few months. Not sure why. Says he has been taking colchicine, coreg, spiro, and farxiga.   Presented to the ED with chest pain. CXR possible edema/infection. BNP 974, respiratory panel negative, WBC 12.7, HS Trop 24>20, procalcitonin < 0.1, and creatinine 1.5. Started IV lasix. Overall he has received a total of 80 mg IV lasix.  Echo repeated EF 20-25%, moderate LVH, RV mildly enlarged., LA moderately dilated.   Feels better today. Denies SOB. Denies chest pain.   Cardiac Studies  cMRI (3/23): EF 15% with markedly dilated LV with evidence of diffuse myopericarditis. RV 33%   - R/LHC (3/23): normal cors EF 25%  RA = 3 RV = 32/10 PA = 34/20 (26) PCW = 17 Fick cardiac output/index = 6.1/2.5 PVR = 1.5 WU Ao sat = 94% PA sat = 68%, 68% Review of Systems: [y] = yes, [ ]  = no   General: Weight gain [ ] ; Weight loss [ ] ; Anorexia [ ] ; Fatigue [ ] ; Fever [ ] ; Chills [ ] ; Weakness [ ]   Cardiac: Chest pain/pressure [ ] ; Resting SOB [ ] ; Exertional SOB [ Y]; Orthopnea [ ] ; Pedal Edema [ ] ; Palpitations [ ] ; Syncope [ ] ; Presyncope [ ] ; Paroxysmal nocturnal dyspnea[ ]   Pulmonary: Cough [ ] ; Wheezing[ ] ; Hemoptysis[ ] ; Sputum [ ] ; Snoring [ ]   GI: Vomiting[ ] ; Dysphagia[ ] ; Melena[ ] ; Hematochezia [ ] ;  Heartburn[ ] ; Abdominal pain [ ] ; Constipation [ ] ; Diarrhea [ ] ; BRBPR [ ]   GU: Hematuria[ ] ; Dysuria [ ] ; Nocturia[ ]   Vascular: Pain in legs with walking [ ] ; Pain in feet with lying flat [ ] ; Non-healing sores [ ] ; Stroke [ ] ; TIA [ ] ; Slurred speech [ ] ;  Neuro: Headaches[ ] ; Vertigo[ ] ; Seizures[ ] ; Paresthesias[ ] ;Blurred vision [ ] ; Diplopia [ ] ; Vision changes [ ]   Ortho/Skin: Arthritis [ ] ; Joint pain [ ] ; Muscle pain [ ] ; Joint swelling [ ] ; Back Pain [Y ]; Rash [ ]   Psych: Depression[ ] ; Anxiety[ ]   Heme: Bleeding problems [ ] ; Clotting disorders [ ] ; Anemia [ ]   Endocrine: Diabetes [ ] ; Thyroid dysfunction[ ]   Home Medications Prior to Admission medications   Medication Sig Start Date End Date Taking? Authorizing Provider  carvedilol (COREG) 3.125 MG tablet Take 1 tablet (3.125 mg total) by mouth 2 (two) times daily with a meal. 04/16/21  Yes McClung, Angela M, PA-C  dapagliflozin propanediol (FARXIGA) 10 MG TABS tablet Take 1 tablet (10 mg total) by mouth daily before breakfast. 04/16/21  Yes McClung, Angela M, PA-C  sacubitril-valsartan (ENTRESTO) 97-103 MG Take 1 tablet by mouth 2 (two) times daily. Please cancel all previous orders for current medication. Change in dosage or pill  size. Patient taking differently: Take 1 tablet by mouth 2 (two) times daily. 04/21/21  Yes Milford, Anderson Malta, FNP  spironolactone (ALDACTONE) 25 MG tablet Take 1 tablet (25 mg total) by mouth daily. 04/16/21  Yes Anders Simmonds, PA-C  aspirin EC 81 MG tablet Take 1 tablet (81 mg total) by mouth daily. Swallow whole. Patient not taking: Reported on 08/27/2021 03/22/21   Andrey Farmer, PA-C    Past Medical History: Past Medical History:  Diagnosis Date   AKI (acute kidney injury) [facility])    CHF (congestive heart failure) (HCC)    Gout     Past Surgical History: Past Surgical History:  Procedure Laterality Date   RIGHT/LEFT HEART CATH AND CORONARY ANGIOGRAPHY N/A 03/20/2021   Procedure:  RIGHT/LEFT HEART CATH AND CORONARY ANGIOGRAPHY;  Surgeon: Dolores Patty, MD;  Location: [facility];  Service: Cardiovascular;  Laterality: N/A;    Family History: No family history on file.  Social History: Social History   Socioeconomic History   Marital status: Single    Spouse name: Not on file   Number of children: Not on file   Years of education: Not on file   Highest education level: Not on file  Occupational History   Not on file  Tobacco Use   Smoking status: Former    Types: Cigars, Cigarettes   Smokeless tobacco: Never  Vaping Use   Vaping Use: Never used  Substance and Sexual Activity   Alcohol use: Yes    Comment: occ   Drug use: Never   Sexual activity: Not on file  Other Topics Concern   Not on file  Social History Narrative   Not on file   Social Determinants of Health   Financial Resource Strain: High Risk (03/21/2021)   Overall Financial Resource Strain (CARDIA)    Difficulty of Paying Living Expenses: Hard  Food Insecurity: Not on file  Transportation Needs: Not on file  Physical Activity: Not on file  Stress: Not on file  Social Connections: Not on file    Allergies:  No Known Allergies  Objective:    Vital Signs:   Temp:  [98.1 F (36.7 C)-99.6 F (37.6 C)] 98.1 F (36.7 C) (08/17 1104) Pulse Rate:  [75-102] 94 (08/17 1104) Resp:  [18-44] 18 (08/17 1104) BP: (114-135)/(86-105) 135/101 (08/17 1104) SpO2:  [93 %-97 %] 93 % (08/17 1104) Weight:  [125.1 kg-127 kg] 125.1 kg (08/17 0112) Last BM Date : 08/26/21  Weight change: Filed Weights   08/27/21 1148 08/28/21 0112  Weight: 127 kg 125.1 kg    Intake/Output:   Intake/Output Summary (Last 24 hours) at 08/28/2021 1127 Last data filed at 08/28/2021 0955 Gross per 24 hour  Intake 600 ml  Output 2350 ml  Net -1750 ml      Physical Exam    General:  Well appearing. No resp difficulty HEENT: normal Neck: supple. JVP 6-7 . Carotids 2+ bilat; no bruits. No  lymphadenopathy or thyromegaly appreciated. Cor: PMI nondisplaced. Regular rate & rhythm. No rubs, gallops or murmurs. Lungs: clear Abdomen: soft, nontender, nondistended. No hepatosplenomegaly. No bruits or masses. Good bowel sounds. Extremities: no cyanosis, clubbing, rash, edema Neuro: alert & orientedx3, cranial nerves grossly intact. moves all 4 extremities w/o difficulty. Affect pleasant   Telemetry   SR 90s Narrow QRS   EKG    SR 98 bpm personally checked.   Labs   Basic Metabolic Panel: Recent Labs  Lab 08/27/21 1233 08/27/21 1836 08/28/21 6773  NA 139  --  140  K 3.9  --  3.6  CL 109  --  107  CO2 23  --  24  GLUCOSE 105*  --  99  BUN 12  --  15  CREATININE 1.49*  --  1.61*  CALCIUM 8.6*  --  8.6*  MG  --  2.0  --     Liver Function Tests: No results for input(s): "AST", "ALT", "ALKPHOS", "BILITOT", "PROT", "ALBUMIN" in the last 168 hours. No results for input(s): "LIPASE", "AMYLASE" in the last 168 hours. No results for input(s): "AMMONIA" in the last 168 hours.  CBC: Recent Labs  Lab 08/27/21 1233 08/28/21 0428  WBC 12.7* 9.1  NEUTROABS 9.8*  --   HGB 13.8 13.4  HCT 39.6 37.9*  MCV 89.8 89.0  PLT 249 226    Cardiac Enzymes: No results for input(s): "CKTOTAL", "CKMB", "CKMBINDEX", "TROPONINI" in the last 168 hours.  BNP: BNP (last 3 results) Recent Labs    03/19/21 0712 08/27/21 1233  BNP 417.7* 974.0*    ProBNP (last 3 results) No results for input(s): "PROBNP" in the last 8760 hours.   CBG: No results for input(s): "GLUCAP" in the last 168 hours.  Coagulation Studies: No results for input(s): "LABPROT", "INR" in the last 72 hours.   Imaging   ECHOCARDIOGRAM COMPLETE  Result Date: 08/28/2021    ECHOCARDIOGRAM REPORT   Patient Name:   Robert Daniels Date of Exam: 08/28/2021 Medical Rec #:  [id number]        Height:       72.0 in Accession #:    [id number]       Weight:       275.8 lb Date of Birth:  10/05/1985        BSA:           2.442 m Patient Age:    36 years         BP:           131/103 mmHg Patient Gender: M                HR:           87 bpm. Exam Location:  Inpatient Procedure: 2D Echo, Cardiac Doppler, Color Doppler and Intracardiac            Opacification Agent Indications:    CHF  History:        Patient has prior history of Echocardiogram examinations, most                 recent 03/19/2021.  Sonographer:    Gaynell Face Referring Phys: [id number] ALLISON WOLFE IMPRESSIONS  1. Left ventricular ejection fraction, by estimation, is 20 to 25%. Left ventricular ejection fraction by PLAX is 24 %. The left ventricle has severely decreased function. The left ventricle demonstrates global hypokinesis. The left ventricular internal  cavity size was severely dilated. There is moderate left ventricular hypertrophy. Left ventricular diastolic parameters are indeterminate. Elevated left ventricular end-diastolic pressure.  2. Right ventricular systolic function is low normal. The right ventricular size is mildly enlarged. Tricuspid regurgitation signal is inadequate for assessing PA pressure.  3. Left atrial size was moderately dilated.  4. The mitral valve is abnormal. Mild mitral valve regurgitation.  5. The aortic valve is tricuspid. Aortic valve regurgitation is not visualized.  6. The inferior vena cava is normal in size with greater than 50% respiratory variability, suggesting right atrial pressure of 3 mmHg. Comparison(s): No  significant change from prior study. 03/19/2021: LVEF 20-25%, global hypokinesis. FINDINGS  Left Ventricle: Left ventricular ejection fraction, by estimation, is 20 to 25%. Left ventricular ejection fraction by PLAX is 24 %. The left ventricle has severely decreased function. The left ventricle demonstrates global hypokinesis. The left ventricular internal cavity size was severely dilated. There is moderate left ventricular hypertrophy. Left ventricular diastolic parameters are indeterminate. Elevated left  ventricular end-diastolic pressure. Right Ventricle: The right ventricular size is mildly enlarged. No increase in right ventricular wall thickness. Right ventricular systolic function is low normal. Tricuspid regurgitation signal is inadequate for assessing PA pressure. Left Atrium: Left atrial size was moderately dilated. Right Atrium: Right atrial size was normal in size. Pericardium: There is no evidence of pericardial effusion. Mitral Valve: The mitral valve is abnormal. There is mild calcification of the mitral valve leaflet(s). Mild mitral valve regurgitation, with centrally-directed jet. Tricuspid Valve: The tricuspid valve is grossly normal. Tricuspid valve regurgitation is not demonstrated. Aortic Valve: The aortic valve is tricuspid. Aortic valve regurgitation is not visualized. Aortic valve mean gradient measures 2.0 mmHg. Aortic valve peak gradient measures 4.1 mmHg. Aortic valve area, by VTI measures 3.50 cm. Pulmonic Valve: The pulmonic valve was grossly normal. Pulmonic valve regurgitation is trivial. Aorta: The aortic root and ascending aorta are structurally normal, with no evidence of dilitation. Venous: The inferior vena cava is normal in size with greater than 50% respiratory variability, suggesting right atrial pressure of 3 mmHg. IAS/Shunts: No atrial level shunt detected by color flow Doppler.  LEFT VENTRICLE PLAX 2D LV EF:         Left ventricular ejection fraction by PLAX is 24 %. LVIDd:         7.10 cm LVIDs:         6.30 cm LV PW:         1.30 cm LV IVS:        1.30 cm LVOT diam:     2.70 cm LV SV:         58 LV SV Index:   24 LVOT Area:     5.73 cm  LV Volumes (MOD) LV vol d, MOD A4C: 350.0 ml LV vol s, MOD A4C: 267.0 ml LV SV MOD A4C:     350.0 ml RIGHT VENTRICLE RV S prime:     10.60 cm/s TAPSE (M-mode): 1.8 cm LEFT ATRIUM              Index        RIGHT ATRIUM           Index LA diam:        4.30 cm  1.76 cm/m   RA Area:     19.90 cm LA Vol (A2C):   112.0 ml 45.86 ml/m  RA  Volume:   63.50 ml  26.00 ml/m LA Vol (A4C):   111.0 ml 45.45 ml/m LA Biplane Vol: 115.0 ml 47.09 ml/m  AORTIC VALVE AV Area (Vmax):    3.44 cm AV Area (Vmean):   3.13 cm AV Area (VTI):     3.50 cm AV Vmax:           101.00 cm/s AV Vmean:          69.400 cm/s AV VTI:            0.167 m AV Peak Grad:      4.1 mmHg AV Mean Grad:      2.0 mmHg LVOT Vmax:  60.60 cm/s LVOT Vmean:        37.900 cm/s LVOT VTI:          0.102 m LVOT/AV VTI ratio: 0.61  AORTA Ao Root diam: 3.50 cm Ao Asc diam:  3.00 cm  SHUNTS Systemic VTI:  0.10 m Systemic Diam: 2.70 cm Lyman Bishop MD Electronically signed by Lyman Bishop MD Signature Date/Time: 08/28/2021/11:06:45 AM    Final    DG Chest 2 View  Result Date: 08/27/2021 CLINICAL DATA:  Shortness of breath EXAM: CHEST - 2 VIEW COMPARISON:  03/20/2021 FINDINGS: Cardiomegaly. Diffuse bilateral heterogeneous and interstitial opacity. The visualized skeletal structures are unremarkable. IMPRESSION: Cardiomegaly with diffuse bilateral heterogeneous and interstitial opacity, which may reflect edema or infection. Electronically Signed   By: Delanna Ahmadi M.D.   On: 08/27/2021 12:12     Medications:     Current Medications:  carvedilol  3.125 mg Oral BID WC   dapagliflozin propanediol  10 mg Oral QAC breakfast   enoxaparin (LOVENOX) injection  40 mg Subcutaneous Q24H   furosemide  40 mg Intravenous BID   sodium chloride flush  3 mL Intravenous Q12H   spironolactone  12.5 mg Oral Daily    Infusions:  sodium chloride        Patient Profile   Robert Daniels is a 36 year old with history obesity, tobacco abuse and HFrEF due to NICM.  Admitted with A/C HFrEF.   Assessment/Plan   1. Chest Pain -H/O acute myopericarditis 2023. Had [place] 2023 with nromal cors.  -HS Trop 24>20.  -Suspect demand ischemia - No chest pain.   2. A/C HFrEF , NICM  H/O acute myopericarditis 2023.ECHO at that time EF 25%. 03/2021 [facility] LVEF 15% with markedly dilated LV, RVEF 33%, with  evidence of diffuse myopericarditis. Echo repeated today EF remains reduced 20-25%.   Admitted with volume overload. Suspect due to high sodium diet. Discussed and educated. BNP 974. Volume status improved with IV lasix. Can stop IV lasix and optimize GDMT.  - Continue coreg 3.125 mg twice a day  -Start entresto 49-51 mg twice a day  -Continue farxiga 10 mg daily  -Continue spiro 12. 5 mg daily   3. CKD Stage IIIa -Creatinine baseline 1.4-1.6 -Creatinine 1.6 today   4. HTN -Elevated. As above adjusting GDMT    Consult cardiac rehab.    Length of Stay: 0  Darrick Grinder, NP  08/28/2021, 11:27 AM  Advanced Heart Failure Team Pager [phone] (M-F; 7a - 5p)  Please contact [facility] for night-coverage after hours (4p -7a ) and weekends on [email or web address]  Patient seen with NP, agree with the above note.    He was admitted with chest pain and exertional dyspnea.  He is not on a diuretic at home and has been off Entresto for several months.   He has had IV Lasix here, is feeling significantly better.   General: NAD Neck: No JVD, no thyromegaly or thyroid nodule.  Lungs: Clear to auscultation bilaterally with normal respiratory effort. CV: Lateral.  Heart regular S1/S2, no S3/S4, no murmur.  No peripheral edema.  No carotid bruit.  Normal pedal pulses.  Abdomen: Soft, nontender, no hepatosplenomegaly, no distention.  Skin: Intact without lesions or rashes.  Neurologic: Alert and oriented x 3.  Psych: Normal affect. Extremities: No clubbing or cyanosis.  HEENT: Normal.   Echo reviewed, EF remains 20-25% with mild RV dysfunction.  Possible prior myopericarditis based on cMRI at last admission.  He is not currently having chest  pain. Breathing is considerably improved with IV Lasix and he does not look volume overloaded on exam. - Restart his prior Entresto dose.  - For now, will have him take Lasix 20 mg daily. Will reassess as outpatient.   Ambulate, should be ready for home soon.    Loralie Champagne 08/28/2021 1:59 PM

## 2021-08-28 NOTE — Progress Notes (Addendum)
PROGRESS NOTE    Robert Daniels  CWC:[id number] DOB: 19-Dec-1985 DOA: 08/27/2021 PCP: Pcp, No  36/M diagnosed with systolic CHF with EF of 20-25% in 03/2021, NICM, due to myopericarditis presented to Ed with shortness of breath, cough, orthopnea and chest tightness x 2 days.  Missed last couple of appointments with cardiology, out of Entresto, was taking coreg, spironolactone and farxiga.  -In the ED BNP 974, troponin 24, WBC 12.7, chest x-ray with cardiomegaly, bilateral heterogenous and interstitial opacity may reflect edema or infection  Subjective: -Breathing better, overall improving  Assessment and Plan:  Acute on chronic systolic (congestive) heart failure (HCC) -history of myopericarditis induced systolic CHF in 03/2021 with EF of 20-25%. Confirmed by cardiac MRI. Heart cath with normal coronary arteries 3/23.   -Recent poor compliance -He is 2 L negative, continue Lasix 40 Mg twice daily today, continue Aldactone, Farxiga, Coreg, will d/w CHF team -Restart Entresto tomorrow if kidney function is stable  -Follow-up repeat echo, hopefully his EF is improving -BMP in a.m.  AKI on CKD 3a -Baseline creatinine around 1.4, now 1.6 likely cardiorenal -Monitor with diuresis, Entresto on hold today  HTN (hypertension) -Continue Coreg and Aldactone, BP stable  DVT prophylaxis: Add Lovenox Code Status: Full Family Communication: Wife at bedside Disposition Plan: Home in 1 to 2 days  Consultants:    Procedures:   Antimicrobials:    Objective: Vitals:   08/27/21 2008 08/28/21 0112 08/28/21 0539 08/28/21 0718  BP: (!) 129/93 117/89 117/86 (!) 131/103  Pulse: 92 80 85 89  Resp: 20 20 20 18   Temp: 99.2 F (37.3 C) 99.1 F (37.3 C) 99 F (37.2 C) 98.7 F (37.1 C)  TempSrc: Oral Oral Oral Oral  SpO2: 94% 97% 95% 93%  Weight:  125.1 kg    Height:        Intake/Output Summary (Last 24 hours) at 08/28/2021 1004 Last data filed at 08/28/2021 0955 Gross per 24 hour  Intake  600 ml  Output 2350 ml  Net -1750 ml   Filed Weights   08/27/21 1148 08/28/21 0112  Weight: 127 kg 125.1 kg    Examination:  General exam: Appears calm and comfortable  HEENt: neck obese unable to assess JVD Respiratory system: Clear to auscultation Cardiovascular system: S1 & S2 heard, RRR.  Abd: nondistended, soft and nontender.Normal bowel sounds heard. Central nervous system: Alert and oriented. No focal neurological deficits. Extremities: trace edema Skin: No rashes Psychiatry:  Mood & affect appropriate.     Data Reviewed:   CBC: Recent Labs  Lab 08/27/21 1233 08/28/21 0428  WBC 12.7* 9.1  NEUTROABS 9.8*  --   HGB 13.8 13.4  HCT 39.6 37.9*  MCV 89.8 89.0  PLT 249 226   Basic Metabolic Panel: Recent Labs  Lab 08/27/21 1233 08/27/21 1836 08/28/21 0428  NA 139  --  140  K 3.9  --  3.6  CL 109  --  107  CO2 23  --  24  GLUCOSE 105*  --  99  BUN 12  --  15  CREATININE 1.49*  --  1.61*  CALCIUM 8.6*  --  8.6*  MG  --  2.0  --    GFR: Estimated Creatinine Clearance: 86.7 mL/min (A) (by C-G formula based on SCr of 1.61 mg/dL (H)). Liver Function Tests: No results for input(s): "AST", "ALT", "ALKPHOS", "BILITOT", "PROT", "ALBUMIN" in the last 168 hours. No results for input(s): "LIPASE", "AMYLASE" in the last 168 hours. No results for input(s): "  AMMONIA" in the last 168 hours. Coagulation Profile: No results for input(s): "INR", "PROTIME" in the last 168 hours. Cardiac Enzymes: No results for input(s): "CKTOTAL", "CKMB", "CKMBINDEX", "TROPONINI" in the last 168 hours. BNP (last 3 results) No results for input(s): "PROBNP" in the last 8760 hours. HbA1C: No results for input(s): "HGBA1C" in the last 72 hours. CBG: No results for input(s): "GLUCAP" in the last 168 hours. Lipid Profile: No results for input(s): "CHOL", "HDL", "LDLCALC", "TRIG", "CHOLHDL", "LDLDIRECT" in the last 72 hours. Thyroid Function Tests: No results for input(s): "TSH",  "T4TOTAL", "FREET4", "T3FREE", "THYROIDAB" in the last 72 hours. Anemia Panel: No results for input(s): "VITAMINB12", "FOLATE", "FERRITIN", "TIBC", "IRON", "RETICCTPCT" in the last 72 hours. Urine analysis:    Component Value Date/Time   COLORURINE COLORLESS (A) 03/19/2021 1750   APPEARANCEUR CLEAR 03/19/2021 1750   LABSPEC 1.005 03/19/2021 1750   PHURINE 6.0 03/19/2021 1750   GLUCOSEU NEGATIVE 03/19/2021 1750   HGBUR NEGATIVE 03/19/2021 1750   BILIRUBINUR NEGATIVE 03/19/2021 1750   KETONESUR NEGATIVE 03/19/2021 1750   PROTEINUR NEGATIVE 03/19/2021 1750   UROBILINOGEN 0.2 04/09/2012 0651   NITRITE NEGATIVE 03/19/2021 1750   LEUKOCYTESUR NEGATIVE 03/19/2021 1750   Sepsis Labs: @LABRCNTIP (procalcitonin:4,lacticidven:4)  ) Recent Results (from the past 240 hour(s))  Resp Panel by RT-PCR (Flu A&B, Covid) Anterior Nasal Swab     Status: None   Collection Time: 08/27/21 12:33 PM   Specimen: Anterior Nasal Swab  Result Value Ref Range Status   SARS Coronavirus 2 by RT PCR NEGATIVE NEGATIVE Final    Comment: (NOTE) SARS-CoV-2 target nucleic acids are NOT DETECTED.  The SARS-CoV-2 RNA is generally detectable in upper respiratory specimens during the acute phase of infection. The lowest concentration of SARS-CoV-2 viral copies this assay can detect is 138 copies/mL. A negative result does not preclude SARS-Cov-2 infection and should not be used as the sole basis for treatment or other patient management decisions. A negative result may occur with  improper specimen collection/handling, submission of specimen other than nasopharyngeal swab, presence of viral mutation(s) within the areas targeted by this assay, and inadequate number of viral copies(<138 copies/mL). A negative result must be combined with clinical observations, patient history, and epidemiological information. The expected result is Negative.  Fact Sheet for Patients:   08/29/21  Fact Sheet for Healthcare Providers:  BloggerCourse.com  This test is no t yet approved or cleared by the [employer].[place] FDA and  has been authorized for detection and/or diagnosis of SARS-CoV-2 by FDA under an Emergency Use Authorization (EUA). This EUA will remain  in effect (meaning this test can be used) for the duration of the COVID-19 declaration under Section 564(b)(1) of the Act, 21 U.S.C.section 360bbb-3(b)(1), unless the authorization is terminated  or revoked sooner.       Influenza A by PCR NEGATIVE NEGATIVE Final   Influenza B by PCR NEGATIVE NEGATIVE Final    Comment: (NOTE) The Xpert Xpress SARS-CoV-2/FLU/RSV plus assay is intended as an aid in the diagnosis of influenza from Nasopharyngeal swab specimens and should not be used as a sole basis for treatment. Nasal washings and aspirates are unacceptable for Xpert Xpress SARS-CoV-2/FLU/RSV testing.  Fact Sheet for Patients: [place]  Fact Sheet for Healthcare Providers: BloggerCourse.com  This test is not yet approved or cleared by the [employer] FDA and has been authorized for detection and/or diagnosis of SARS-CoV-2 by FDA under an Emergency Use Authorization (EUA). This EUA will remain in effect (meaning this test can be used) for  the duration of the COVID-19 declaration under Section 564(b)(1) of the Act, 21 U.S.C. section 360bbb-3(b)(1), unless the authorization is terminated or revoked.  Performed at [facility], [address] [place], [place] [address]   Respiratory (~20 pathogens) panel by PCR     Status: None   Collection Time: 08/27/21  6:23 PM   Specimen: Nasopharyngeal Swab; Respiratory  Result Value Ref Range Status   Adenovirus NOT DETECTED NOT DETECTED Final   Coronavirus 229E NOT DETECTED NOT DETECTED Final    Comment: (NOTE) The Coronavirus on the  Respiratory Panel, DOES NOT test for the novel  Coronavirus (2019 nCoV)    Coronavirus HKU1 NOT DETECTED NOT DETECTED Final   Coronavirus NL63 NOT DETECTED NOT DETECTED Final   Coronavirus OC43 NOT DETECTED NOT DETECTED Final   Metapneumovirus NOT DETECTED NOT DETECTED Final   Rhinovirus / Enterovirus NOT DETECTED NOT DETECTED Final   Influenza A NOT DETECTED NOT DETECTED Final   Influenza B NOT DETECTED NOT DETECTED Final   Parainfluenza Virus 1 NOT DETECTED NOT DETECTED Final   Parainfluenza Virus 2 NOT DETECTED NOT DETECTED Final   Parainfluenza Virus 3 NOT DETECTED NOT DETECTED Final   Parainfluenza Virus 4 NOT DETECTED NOT DETECTED Final   Respiratory Syncytial Virus NOT DETECTED NOT DETECTED Final   Bordetella pertussis NOT DETECTED NOT DETECTED Final   Bordetella Parapertussis NOT DETECTED NOT DETECTED Final   Chlamydophila pneumoniae NOT DETECTED NOT DETECTED Final   Mycoplasma pneumoniae NOT DETECTED NOT DETECTED Final    Comment: Performed at [facility], [address], [place], [place] [address]     Radiology Studies: DG Chest 2 View  Result Date: 08/27/2021 CLINICAL DATA:  Shortness of breath EXAM: CHEST - 2 VIEW COMPARISON:  03/20/2021 FINDINGS: Cardiomegaly. Diffuse bilateral heterogeneous and interstitial opacity. The visualized skeletal structures are unremarkable. IMPRESSION: Cardiomegaly with diffuse bilateral heterogeneous and interstitial opacity, which may reflect edema or infection. Electronically Signed   By: Jearld Lesch M.D.   On: 08/27/2021 12:12     Scheduled Meds:  carvedilol  3.125 mg Oral BID WC   dapagliflozin propanediol  10 mg Oral QAC breakfast   furosemide  40 mg Intravenous Daily   sodium chloride flush  3 mL Intravenous Q12H   spironolactone  12.5 mg Oral Daily   Continuous Infusions:  sodium chloride       LOS: 0 days    Time spent:  Zannie Cove, MD [facility]   08/28/2021, 10:04 AM

## 2021-08-28 NOTE — Progress Notes (Signed)
Heart Failure Navigator Progress Note  Assessed for Heart & Vascular TOC clinic readiness.  Patient does not meet criteria due to Advanced Heart Failure Team..     Haruo Stepanek, BSN, RN Heart Failure Nurse Navigator Secure Chat Only   

## 2021-08-29 ENCOUNTER — Other Ambulatory Visit (HOSPITAL_COMMUNITY): Payer: Self-pay

## 2021-08-29 DIAGNOSIS — I5023 Acute on chronic systolic (congestive) heart failure: Secondary | ICD-10-CM | POA: Diagnosis not present

## 2021-08-29 LAB — BASIC METABOLIC PANEL
Anion gap: 9 (ref 5–15)
BUN: 11 mg/dL (ref 6–20)
CO2: 24 mmol/L (ref 22–32)
Calcium: 9 mg/dL (ref 8.9–10.3)
Chloride: 105 mmol/L (ref 98–111)
Creatinine, Ser: 1.47 mg/dL — ABNORMAL HIGH (ref 0.61–1.24)
GFR, Estimated: >60 mL/min (ref >60)
Glucose, Bld: 105 mg/dL — ABNORMAL HIGH (ref 70–99)
Potassium: 3.7 mmol/L (ref 3.5–5.1)
Sodium: 138 mmol/L (ref 135–145)

## 2021-08-29 MED ORDER — SPIRONOLACTONE 25 MG PO TABS
25.0000 mg | ORAL_TABLET | Freq: Every day | ORAL | 0 refills | Status: DC
  Filled 2021-08-29: qty 30, 30d supply, fill #0

## 2021-08-29 MED ORDER — SACUBITRIL-VALSARTAN 49-51 MG PO TABS
1.0000 | ORAL_TABLET | Freq: Two times a day (BID) | ORAL | 0 refills | Status: DC
  Filled 2021-08-29: qty 60, 30d supply, fill #0

## 2021-08-29 MED ORDER — FUROSEMIDE 20 MG PO TABS
20.0000 mg | ORAL_TABLET | Freq: Every day | ORAL | 0 refills | Status: DC
  Filled 2021-08-29: qty 30, 30d supply, fill #0

## 2021-08-29 MED ORDER — DAPAGLIFLOZIN PROPANEDIOL 10 MG PO TABS
10.0000 mg | ORAL_TABLET | Freq: Every day | ORAL | 0 refills | Status: DC
  Filled 2021-08-29: qty 30, 30d supply, fill #0

## 2021-08-29 MED ORDER — CARVEDILOL 3.125 MG PO TABS
3.1250 mg | ORAL_TABLET | Freq: Two times a day (BID) | ORAL | 0 refills | Status: DC
  Filled 2021-08-29: qty 60, 30d supply, fill #0

## 2021-08-29 NOTE — Progress Notes (Signed)
Pt had 9 beats of V tach earlier tonight while RN was in room. Pt resting in bed and asymptomatic. VS stable. MD to be paged.
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
## 2021-08-29 NOTE — Progress Notes (Signed)
Pt has orders to be discharged. Discharge instructions given and pt has no additional questions at this time. Medication regimen reviewed and pt educated. Pt verbalized understanding and has no additional questions. Telemetry box removed. IV removed and site in good condition. Pt stable and waiting for transportation. 

## 2021-08-29 NOTE — Progress Notes (Signed)
CARDIAC REHAB PHASE I   PRE:  Rate/Rhythm: 88 SR    BP: sitting 110/71    SaO2: 96 RA  MODE:  Ambulation: 470 ft   POST:  Rate/Rhythm: 100 ST    BP: sitting 103/81     SaO2: 98 RA  Tolerated well, no c/o. Discussed HF booklet and low sodium diet. Also discussed walking for exercise. Pt voiced reception. [phone]  Ethelda Chick BS, ACSM-CEP 08/29/2021 10:17 AM

## 2021-09-01 ENCOUNTER — Inpatient Hospital Stay: Payer: No Typology Code available for payment source | Admitting: Family Medicine

## 2021-09-11 ENCOUNTER — Telehealth (HOSPITAL_COMMUNITY): Payer: Self-pay

## 2021-09-11 NOTE — Telephone Encounter (Signed)
Called and left a message with patient's grandmother to confirm/remind patient of their appointment at the [facility] on 09/12/21   Patient reminded to bring all medications and/or complete list.  Confirmed patient has transportation. Gave directions, instructed to utilize valet parking.  Confirmed appointment prior to ending call.

## 2021-09-12 ENCOUNTER — Ambulatory Visit (HOSPITAL_COMMUNITY)
Admit: 2021-09-12 | Discharge: 2021-09-12 | Disposition: A | Discharge status: Home / Self Care | Payer: 59 | Attending: Family Medicine | Admitting: Family Medicine

## 2021-09-12 ENCOUNTER — Other Ambulatory Visit: Payer: Self-pay

## 2021-09-12 ENCOUNTER — Encounter (HOSPITAL_COMMUNITY): Payer: Self-pay

## 2021-09-12 VITALS — BP 130/76 | HR 87 | Wt 281.8 lb

## 2021-09-12 DIAGNOSIS — N1831 Chronic kidney disease, stage 3a: Secondary | ICD-10-CM | POA: Insufficient documentation

## 2021-09-12 DIAGNOSIS — I130 Hypertensive heart and chronic kidney disease with heart failure and stage 1 through stage 4 chronic kidney disease, or unspecified chronic kidney disease: Secondary | ICD-10-CM | POA: Insufficient documentation

## 2021-09-12 DIAGNOSIS — Z720 Tobacco use: Secondary | ICD-10-CM | POA: Diagnosis not present

## 2021-09-12 DIAGNOSIS — I5022 Chronic systolic (congestive) heart failure: Secondary | ICD-10-CM | POA: Diagnosis present

## 2021-09-12 DIAGNOSIS — I10 Essential (primary) hypertension: Secondary | ICD-10-CM | POA: Diagnosis not present

## 2021-09-12 DIAGNOSIS — F17290 Nicotine dependence, other tobacco product, uncomplicated: Secondary | ICD-10-CM | POA: Diagnosis not present

## 2021-09-12 DIAGNOSIS — Z7984 Long term (current) use of oral hypoglycemic drugs: Secondary | ICD-10-CM | POA: Diagnosis not present

## 2021-09-12 DIAGNOSIS — N183 Chronic kidney disease, stage 3 unspecified: Secondary | ICD-10-CM | POA: Diagnosis not present

## 2021-09-12 DIAGNOSIS — I428 Other cardiomyopathies: Secondary | ICD-10-CM | POA: Diagnosis not present

## 2021-09-12 DIAGNOSIS — I509 Heart failure, unspecified: Secondary | ICD-10-CM

## 2021-09-12 DIAGNOSIS — R0683 Snoring: Secondary | ICD-10-CM | POA: Diagnosis not present

## 2021-09-12 DIAGNOSIS — Z79899 Other long term (current) drug therapy: Secondary | ICD-10-CM | POA: Diagnosis not present

## 2021-09-12 LAB — BASIC METABOLIC PANEL
Anion gap: 7 (ref 5–15)
BUN: 10 mg/dL (ref 6–20)
CO2: 24 mmol/L (ref 22–32)
Calcium: 8.9 mg/dL (ref 8.9–10.3)
Chloride: 108 mmol/L (ref 98–111)
Creatinine, Ser: 1.49 mg/dL — ABNORMAL HIGH (ref 0.61–1.24)
GFR, Estimated: >60 mL/min (ref >60)
Glucose, Bld: 109 mg/dL — ABNORMAL HIGH (ref 70–99)
Potassium: 3.7 mmol/L (ref 3.5–5.1)
Sodium: 139 mmol/L (ref 135–145)

## 2021-09-12 LAB — BRAIN NATRIURETIC PEPTIDE: B Natriuretic Peptide: 318.3 pg/mL — ABNORMAL HIGH (ref 0.0–100.0)

## 2021-09-12 MED ORDER — ENTRESTO 97-103 MG PO TABS
1.0000 | ORAL_TABLET | Freq: Two times a day (BID) | ORAL | 11 refills | Status: DC
  Filled 2021-09-12 – 2021-10-15 (×2): qty 60, 30d supply, fill #0

## 2021-09-12 MED ORDER — FUROSEMIDE 20 MG PO TABS
20.0000 mg | ORAL_TABLET | Freq: Every day | ORAL | 5 refills | Status: DC | PRN
  Filled 2021-09-12 – 2021-10-15 (×2): qty 30, 30d supply, fill #0

## 2021-09-12 NOTE — Progress Notes (Signed)
ADVANCED HF CLINIC NOTE  Primary Care: none HF Cardiologist: Dr. Gala Romney  HPI: Robert Daniels is a 36 y.o. 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 Echo EF 20-25%. R/LHC with normal cors, well-compensated filling pressures, EF 25%. cMRI with LVEF 15%, RVEF 33%, findings consistent with myopericarditis. Viral panel negative. Started on GDMT and colchicine. Discharged home, weight 282 lbs.  No-showed for hospital follow up.  Seen in ED 8/23 with a/c CHF. Out of Entresto for a few months. Diuresed with IV lasix. Echo showed  EF 20-25%, moderate LVH, RV mildly enlarged., LA moderately dilated. AHF consulted, Entresto restarted and Lasix 20 mg daily continued.   Today he returns for HF follow up. Overall feeling fine. He is not SOB with activity. Denies palpitations, CP, dizziness, edema, or PND/Orthopnea. Appetite ok. No fever or chills. Weight at home 278 pounds. Taking all medications. Rare ETOH, smokes 3 cigs/days.   Cardiac Studies  - Echo (8/23): EF 20-25%, moderate LVH, RV low/normal.  - cMRI (3/23): EF 15% with markedly dilated LV with evidence of diffuse myopericarditis. RV 33%   - R/LHC (3/23): normal cors EF 25%  RA = 3 RV = 32/10 PA = 34/20 (26) PCW = 17 Fick cardiac output/index = 6.1/2.5 PVR = 1.5 WU Ao sat = 94% PA sat = 68%, 68%  Past Medical History:  Diagnosis Date   AKI (acute kidney injury) (HCC)    CHF (congestive heart failure) (HCC)    Gout     Current Outpatient Medications  Medication Sig Dispense Refill   carvedilol (COREG) 3.125 MG tablet Take 1 tablet (3.125 mg total) by mouth 2 (two) times daily with a meal. 60 tablet 0   dapagliflozin propanediol (FARXIGA) 10 MG TABS tablet Take 1 tablet (10 mg total) by mouth daily before breakfast. 30 tablet 0   furosemide (LASIX) 20 MG tablet Take 1 tablet (20 mg total) by mouth daily. 30 tablet 0   sacubitril-valsartan (ENTRESTO) 49-51 MG  Take 1 tablet by mouth 2 (two) times daily. 60 tablet 0   spironolactone (ALDACTONE) 25 MG tablet Take 1 tablet (25 mg total) by mouth daily. 30 tablet 0   No current facility-administered medications for this encounter.   No Known Allergies  Social History   Socioeconomic History   Marital status: Single    Spouse name: Not on file   Number of children: Not on file   Years of education: Not on file   Highest education level: Not on file  Occupational History   Not on file  Tobacco Use   Smoking status: Former    Types: Cigars, Cigarettes   Smokeless tobacco: Never  Vaping Use   Vaping Use: Never used  Substance and Sexual Activity   Alcohol use: Yes    Comment: occ   Drug use: Never   Sexual activity: Not on file  Other Topics Concern   Not on file  Social History Narrative   Not on file   Social Determinants of Health   Financial Resource Strain: High Risk (03/21/2021)   Overall Financial Resource Strain (CARDIA)    Difficulty of Paying Living Expenses: Hard  Food Insecurity: Not on file  Transportation Needs: Not on file  Physical Activity: Not on file  Stress: Not on file  Social Connections: Not on file  Intimate Partner Violence: Not on file   No family history on file.  BP 130/76  Pulse 87   Wt 127.8 kg (281 lb 12.8 oz)   SpO2 100%   BMI 38.22 kg/m   Wt Readings from Last 3 Encounters:  09/12/21 127.8 kg (281 lb 12.8 oz)  08/29/21 123.8 kg (273 lb)  04/21/21 129.9 kg (286 lb 6.4 oz)   PHYSICAL EXAM: General:  NAD. No resp difficulty HEENT: Normal Neck: Supple. No JVD. Carotids 2+ bilat; no bruits. No lymphadenopathy or thryomegaly appreciated. Cor: PMI nondisplaced. Regular rate & rhythm. No rubs, gallops or murmurs. Lungs: Clear Abdomen: Soft, nontender, nondistended. No hepatosplenomegaly. No bruits or masses. Good bowel sounds. Extremities: No cyanosis, clubbing, rash, edema Neuro: Alert & oriented x 3, cranial nerves grossly intact. Moves  all 4 extremities w/o difficulty. Affect pleasant.  ECG (personally reviewed): NSR with PVC  ASSESSMENT & PLAN: 1. 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 - Viral panel negative - cMRI (3/23): LVEF 15% with markedly dilated LV, RVEF 33%, with evidence of diffuse myopericarditis. - Echo (8/23) EF 20-25%, moderate LVH, RV low/normal. - NYHA I-II, volume looks good today. - Increase Entresto to 97/103 mg bid. - Change lasix to 20 mg PRN - Continue spironolactone 25 mg daily. - Continue Coreg 3.125 mg bid. - Continue Farxiga 10 mg daily. - BMET and BNP today. - Repeat echo when GDMT optimized. - Discussed importance of medication compliance.   2. HTN - BP control improving. - GDMT as above.   3. Tobacco use - Encouraged cessation.   4. CKD 3a - Labs today. - On SGLT2i.  5. Snoring - Consider sleep study when he has insurance.  Follow up in 3 weeks with PharmD (increase beta blocker if able) and 2-3 months with Dr. Gala Romney + echo.  Jacklynn Ganong, FNP  12:21 PM

## 2021-09-12 NOTE — Patient Instructions (Signed)
EKG done today.  Labs done today. We will contact you only if your labs are abnormal.  INCREASE Entresto to 97-103mg  (1 tablet) by mouth 2 times daily.   DECREASE Lasix to 20mg  (1 tablet) by mouth daily as needed for swelling.  No other medication changes were made. Please continue all current medications as prescribed.  Your physician recommends that you schedule a follow-up appointment in: 3-4 weeks with our Clinic Pharmacist and in 2-3 months with Dr. with an echo prior to your appointment.  Your physician has requested that you have an echocardiogram. Echocardiography is a painless test that uses sound waves to create images of your heart. It provides your doctor with information about the size and shape of your heart and how well your heart's chambers and valves are working. This procedure takes approximately one hour. There are no restrictions for this procedure.  If you have any questions or concerns before your next appointment please send Gala Romney a message through [place] or call our office at [phone].    TO LEAVE A MESSAGE FOR THE NURSE SELECT OPTION 2, PLEASE LEAVE A MESSAGE INCLUDING: YOUR NAME DATE OF BIRTH CALL BACK NUMBER REASON FOR CALL**this is important as we prioritize the call backs  YOU WILL RECEIVE A CALL BACK THE SAME DAY AS LONG AS YOU CALL BEFORE 4:00 PM   Do the following things EVERYDAY: Weigh yourself in the morning before breakfast. Write it down and keep it in a log. Take your medicines as prescribed Eat low salt foods--Limit salt (sodium) to 2000 mg per day.  Stay as active as you can everyday Limit all fluids for the day to less than 2 liters   At the Advanced [facility] Failure Clinic, you and your health needs are our priority. As part of our continuing mission to provide you with exceptional heart care, we have created designated Provider Care Teams. These Care Teams include your primary Cardiologist (physician) and Advanced Practice Providers  (APPs- Physician Assistants and Nurse Practitioners) who all work together to provide you with the care you need, when you need it.   You may see any of the following providers on your designated Care Team at your next follow up: Dr [phone] Dr Arvilla Meres, NP Carron Curie, Robbie Lis [place], PharmD   Please be sure to bring in all your medications bottles to every appointment.

## 2021-09-23 NOTE — Discharge Summary (Signed)
Physician Discharge Summary  Robert Daniels CHY:[id number] DOB: [birth year] DOA: 08/27/2021  PCP: Pcp, No  Admit date: 08/27/2021 Discharge date: 08/29/2021  Time spent: 35 minutes  Recommendations for Outpatient Follow-up:  Advanced CHF clinic on 9/1 for FU, please check BMP   Discharge Diagnoses:  Principal Problem:   Acute on chronic systolic (congestive) heart failure (HCC) Active Problems:   Elevated troponin   HTN (hypertension)   CKD (chronic kidney disease) stage 2, GFR 60-89 ml/min   Discharge Condition: stable  Diet recommendation: low sodium heart healthy  Filed Weights   08/27/21 1148 08/28/21 0112 08/29/21 0032  Weight: 127 kg 125.1 kg 123.8 kg    History of present illness:  36/M diagnosed with systolic CHF with EF of 20-25% in 03/2021, NICM, due to myopericarditis presented to Ed with shortness of breath, cough, orthopnea and chest tightness x 2 days.  Missed last couple of appointments with cardiology, out of Entresto, was taking coreg, spironolactone and farxiga.  -In the ED BNP 974, troponin 24, WBC 12.7, chest x-ray with cardiomegaly, bilateral heterogenous and interstitial opacity may reflect edema or infection  Hospital Course:  Acute on chronic systolic (congestive) heart failure (HCC) -history of myopericarditis induced systolic CHF in 03/2021 with EF of 20-25%. Confirmed by cardiac MRI. Heart cath with normal coronary arteries 3/23.   -Recent poor compliance -He is 3 L negative,  -continued coreg, farxiga, aldactone, started on extresto and lasix 20mg  daily   AKI on CKD 3a -Baseline creatinine around 1.4, now 1.6 likely cardiorenal -stable   HTN (hypertension) -Continue Coreg and Aldactone, BP stable    Consultations: Cardiology  Discharge Exam: Vitals:   08/29/21 0032 08/29/21 1018  BP: (!) 133/96 105/70  Pulse: 88 82  Resp: 18 20  Temp: (!) 96 F (35.6 C) 98 F (36.7 C)  SpO2: 96% 99%   General exam: Appears calm and comfortable   HEENt: neck obese unable to assess JVD Respiratory system: Clear to auscultation Cardiovascular system: S1 & S2 heard, RRR.  Abd: nondistended, soft and nontender.Normal bowel sounds heard. Central nervous system: Alert and oriented. No focal neurological deficits. Extremities: trace edema Skin: No rashes Psychiatry:  Mood & affect appropriate.   Discharge Instructions   Discharge Instructions     Diet - low sodium heart healthy   Complete by: As directed    Increase activity slowly   Complete by: As directed       Allergies as of 08/29/2021   No Known Allergies      Medication List     STOP taking these medications    Entresto 97-103 MG Generic drug: sacubitril-valsartan   SM Aspirin Adult Low Strength 81 MG tablet Generic drug: aspirin EC       TAKE these medications    carvedilol 3.125 MG tablet Commonly known as: COREG Take 1 tablet (3.125 mg total) by mouth 2 (two) times daily with a meal.   Farxiga 10 MG Tabs tablet Generic drug: dapagliflozin propanediol Take 1 tablet (10 mg total) by mouth daily before breakfast.   spironolactone 25 MG tablet Commonly known as: ALDACTONE Take 1 tablet (25 mg total) by mouth daily.       No Known Allergies  Follow-up Information     [facility] Follow up on 09/12/2021.   Specialty: Cardiology Why: at 1200. Bring all medications. Contact information: [address] [place] [phone]        [facility]. Go on 09/01/2021.   Why: @9 :30am Contact information: [address] [place] [phone]                 The results of significant diagnostics from this hospitalization (including imaging, microbiology, ancillary and laboratory) are listed below for reference.    Significant Diagnostic Studies: ECHOCARDIOGRAM COMPLETE  Result  Date: 08/28/2021    ECHOCARDIOGRAM REPORT   Patient Name:   Robert Daniels Date of Exam: 08/28/2021 Medical Rec #:  08/30/2021        Height:       72.0 in Accession #:    [id number]       Weight:       275.8 lb Date of Birth:  30-Aug-1985        BSA:          2.442 m Patient Age:    36 years         BP:           131/103 mmHg Patient Gender: M                HR:           87 bpm. Exam Location:  Inpatient Procedure: 2D Echo, Cardiac Doppler, Color Doppler and Intracardiac            Opacification Agent Indications:    CHF  History:        Patient has prior history of Echocardiogram examinations, most                 recent 03/19/2021.  Sonographer:    05/19/2021 Referring Phys: Gaynell Face ALLISON WOLFE IMPRESSIONS  1. Left ventricular ejection fraction, by estimation, is 20 to 25%. Left ventricular ejection fraction by PLAX is 24 %. The left ventricle has severely decreased function. The left ventricle demonstrates global hypokinesis. The left ventricular internal  cavity size was severely dilated. There is moderate left ventricular hypertrophy. Left ventricular diastolic parameters are indeterminate. Elevated left ventricular end-diastolic pressure.  2. Right ventricular systolic function is low normal. The right ventricular size is mildly enlarged. Tricuspid regurgitation signal is inadequate for assessing PA pressure.  3. Left atrial size was moderately dilated.  4. The mitral valve is abnormal. Mild mitral valve regurgitation.  5. The aortic valve is tricuspid. Aortic valve regurgitation is not visualized.  6. The inferior vena cava is normal in size with greater than 50% respiratory variability, suggesting right atrial pressure of 3 mmHg. Comparison(s): No significant change from prior study. 03/19/2021: LVEF 20-25%, global hypokinesis. FINDINGS  Left Ventricle: Left ventricular ejection fraction, by estimation, is 20 to 25%. Left ventricular ejection fraction by PLAX is 24 %. The left ventricle has severely  decreased function. The left ventricle demonstrates global hypokinesis. The left ventricular internal cavity size was severely dilated. There is moderate left ventricular hypertrophy. Left ventricular diastolic parameters are indeterminate. Elevated left ventricular end-diastolic pressure. Right Ventricle: The right ventricular size is mildly enlarged. No increase in right ventricular wall thickness. Right ventricular systolic function is low normal. Tricuspid regurgitation signal is inadequate for assessing PA pressure. Left Atrium: Left atrial size was moderately dilated. Right Atrium: Right atrial size was normal in size. Pericardium: There is no evidence of pericardial effusion. Mitral Valve: The mitral valve is abnormal. There is mild calcification of the mitral valve leaflet(s). Mild mitral valve regurgitation, with centrally-directed jet. Tricuspid Valve: The tricuspid valve is grossly normal. Tricuspid  valve regurgitation is not demonstrated. Aortic Valve: The aortic valve is tricuspid. Aortic valve regurgitation is not visualized. Aortic valve mean gradient measures 2.0 mmHg. Aortic valve peak gradient measures 4.1 mmHg. Aortic valve area, by VTI measures 3.50 cm. Pulmonic Valve: The pulmonic valve was grossly normal. Pulmonic valve regurgitation is trivial. Aorta: The aortic root and ascending aorta are structurally normal, with no evidence of dilitation. Venous: The inferior vena cava is normal in size with greater than 50% respiratory variability, suggesting right atrial pressure of 3 mmHg. IAS/Shunts: No atrial level shunt detected by color flow Doppler.  LEFT VENTRICLE PLAX 2D LV EF:         Left ventricular ejection fraction by PLAX is 24 %. LVIDd:         7.10 cm LVIDs:         6.30 cm LV PW:         1.30 cm LV IVS:        1.30 cm LVOT diam:     2.70 cm LV SV:         58 LV SV Index:   24 LVOT Area:     5.73 cm  LV Volumes (MOD) LV vol d, MOD A4C: 350.0 ml LV vol s, MOD A4C: 267.0 ml LV SV MOD A4C:      350.0 ml RIGHT VENTRICLE RV S prime:     10.60 cm/s TAPSE (M-mode): 1.8 cm LEFT ATRIUM              Index        RIGHT ATRIUM           Index LA diam:        4.30 cm  1.76 cm/m   RA Area:     19.90 cm LA Vol (A2C):   112.0 ml 45.86 ml/m  RA Volume:   63.50 ml  26.00 ml/m LA Vol (A4C):   111.0 ml 45.45 ml/m LA Biplane Vol: 115.0 ml 47.09 ml/m  AORTIC VALVE AV Area (Vmax):    3.44 cm AV Area (Vmean):   3.13 cm AV Area (VTI):     3.50 cm AV Vmax:           101.00 cm/s AV Vmean:          69.400 cm/s AV VTI:            0.167 m AV Peak Grad:      4.1 mmHg AV Mean Grad:      2.0 mmHg LVOT Vmax:         60.60 cm/s LVOT Vmean:        37.900 cm/s LVOT VTI:          0.102 m LVOT/AV VTI ratio: 0.61  AORTA Ao Root diam: 3.50 cm Ao Asc diam:  3.00 cm  SHUNTS Systemic VTI:  0.10 m Systemic Diam: 2.70 cm Lyman Bishop MD Electronically signed by Lyman Bishop MD Signature Date/Time: 08/28/2021/11:06:45 AM    Final    DG Chest 2 View  Result Date: 08/27/2021 CLINICAL DATA:  Shortness of breath EXAM: CHEST - 2 VIEW COMPARISON:  03/20/2021 FINDINGS: Cardiomegaly. Diffuse bilateral heterogeneous and interstitial opacity. The visualized skeletal structures are unremarkable. IMPRESSION: Cardiomegaly with diffuse bilateral heterogeneous and interstitial opacity, which may reflect edema or infection. Electronically Signed   By: Delanna Ahmadi M.D.   On: 08/27/2021 12:12    Microbiology: No results found for this or any previous visit (from the past 240 hour(s)).   Labs:  Basic Metabolic Panel: No results for input(s): "NA", "K", "CL", "CO2", "GLUCOSE", "BUN", "CREATININE", "CALCIUM", "MG", "PHOS" in the last 168 hours. Liver Function Tests: No results for input(s): "AST", "ALT", "ALKPHOS", "BILITOT", "PROT", "ALBUMIN" in the last 168 hours. No results for input(s): "LIPASE", "AMYLASE" in the last 168 hours. No results for input(s): "AMMONIA" in the last 168 hours. CBC: No results for input(s): "WBC", "NEUTROABS",  "HGB", "HCT", "MCV", "PLT" in the last 168 hours. Cardiac Enzymes: No results for input(s): "CKTOTAL", "CKMB", "CKMBINDEX", "TROPONINI" in the last 168 hours. BNP: BNP (last 3 results) Recent Labs    03/19/21 0712 08/27/21 1233 09/12/21 1233  BNP 417.7* 974.0* 318.3*    ProBNP (last 3 results) No results for input(s): "PROBNP" in the last 8760 hours.  CBG: No results for input(s): "GLUCAP" in the last 168 hours.     Signed:  Domenic Polite MD.  [facility] 09/23/2021, 8:19 AM

## 2021-10-01 ENCOUNTER — Other Ambulatory Visit (HOSPITAL_COMMUNITY): Payer: Self-pay

## 2021-10-01 ENCOUNTER — Encounter (HOSPITAL_COMMUNITY): Payer: Self-pay | Admitting: Internal Medicine

## 2021-10-02 ENCOUNTER — Other Ambulatory Visit (HOSPITAL_COMMUNITY): Payer: Self-pay

## 2021-10-15 ENCOUNTER — Telehealth (HOSPITAL_COMMUNITY): Payer: Self-pay

## 2021-10-15 ENCOUNTER — Inpatient Hospital Stay (HOSPITAL_COMMUNITY): Admission: RE | Admit: 2021-10-15 | Payer: No Typology Code available for payment source | Source: Ambulatory Visit

## 2021-10-15 ENCOUNTER — Other Ambulatory Visit: Payer: Self-pay

## 2021-10-15 ENCOUNTER — Other Ambulatory Visit (HOSPITAL_COMMUNITY): Payer: Self-pay

## 2021-10-15 NOTE — Telephone Encounter (Signed)
Advanced Heart Failure Patient Advocate Encounter  Review of patient chart for medication assistance.  Patient is currently uninsured.  Delene Loll [employer]) application was sent in 03/23, unsure if patient ever submitted POI. May need to restart application process.  [facility] does not have any current assistance on file. If this was being filled using the [place], may need to change to [employer]) and start a new application for assistance.  Left message on patient voicemail.  Clista Bernhardt, CPhT Rx Patient Advocate Phone: [phone]

## 2021-10-16 ENCOUNTER — Other Ambulatory Visit: Payer: Self-pay

## 2021-10-26 ENCOUNTER — Emergency Department (HOSPITAL_COMMUNITY): Payer: 59

## 2021-10-26 ENCOUNTER — Emergency Department (HOSPITAL_COMMUNITY)
Admission: EM | Admit: 2021-10-26 | Discharge: 2021-10-26 | Disposition: A | Discharge status: Home / Self Care | Payer: 59 | Attending: Emergency Medicine | Admitting: Emergency Medicine

## 2021-10-26 DIAGNOSIS — W3400XA Accidental discharge from unspecified firearms or gun, initial encounter: Secondary | ICD-10-CM | POA: Insufficient documentation

## 2021-10-26 DIAGNOSIS — T07XXXA Unspecified multiple injuries, initial encounter: Secondary | ICD-10-CM

## 2021-10-26 DIAGNOSIS — S81801A Unspecified open wound, right lower leg, initial encounter: Secondary | ICD-10-CM | POA: Insufficient documentation

## 2021-10-26 DIAGNOSIS — Z23 Encounter for immunization: Secondary | ICD-10-CM | POA: Insufficient documentation

## 2021-10-26 DIAGNOSIS — S81802A Unspecified open wound, left lower leg, initial encounter: Secondary | ICD-10-CM | POA: Insufficient documentation

## 2021-10-26 LAB — COMPREHENSIVE METABOLIC PANEL
ALT: 18 U/L (ref 0–44)
AST: 20 U/L (ref 15–41)
Albumin: 4.2 g/dL (ref 3.5–5.0)
Alkaline Phosphatase: 46 U/L (ref 38–126)
Anion gap: 9 (ref 5–15)
BUN: 10 mg/dL (ref 6–20)
CO2: 22 mmol/L (ref 22–32)
Calcium: 9.1 mg/dL (ref 8.9–10.3)
Chloride: 110 mmol/L (ref 98–111)
Creatinine, Ser: 1.64 mg/dL — ABNORMAL HIGH (ref 0.61–1.24)
GFR, Estimated: 55 mL/min — ABNORMAL LOW (ref >60)
Glucose, Bld: 115 mg/dL — ABNORMAL HIGH (ref 70–99)
Potassium: 3.6 mmol/L (ref 3.5–5.1)
Sodium: 141 mmol/L (ref 135–145)
Total Bilirubin: 0.5 mg/dL (ref 0.3–1.2)
Total Protein: 7.3 g/dL (ref 6.5–8.1)

## 2021-10-26 LAB — I-STAT CHEM 8, ED
BUN: 11 mg/dL (ref 6–20)
Calcium, Ion: 1.1 mmol/L — ABNORMAL LOW (ref 1.15–1.40)
Chloride: 106 mmol/L (ref 98–111)
Creatinine, Ser: 1.9 mg/dL — ABNORMAL HIGH (ref 0.61–1.24)
Glucose, Bld: 115 mg/dL — ABNORMAL HIGH (ref 70–99)
HCT: 45 % (ref 39.0–52.0)
Hemoglobin: 15.3 g/dL (ref 13.0–17.0)
Potassium: 3.7 mmol/L (ref 3.5–5.1)
Sodium: 142 mmol/L (ref 135–145)
TCO2: 23 mmol/L (ref 22–32)

## 2021-10-26 LAB — CBC
HCT: 42.3 % (ref 39.0–52.0)
Hemoglobin: 14.7 g/dL (ref 13.0–17.0)
MCH: 31.2 pg (ref 26.0–34.0)
MCHC: 34.8 g/dL (ref 30.0–36.0)
MCV: 89.8 fL (ref 80.0–100.0)
Platelets: 241 10*3/uL (ref 150–400)
RBC: 4.71 MIL/uL (ref 4.22–5.81)
RDW: 13.1 % (ref 11.5–15.5)
WBC: 10.3 10*3/uL (ref 4.0–10.5)
nRBC: 0 % (ref 0.0–0.2)

## 2021-10-26 LAB — URINALYSIS, ROUTINE W REFLEX MICROSCOPIC
Bacteria, UA: NONE SEEN
Bilirubin Urine: NEGATIVE
Glucose, UA: >=500 mg/dL — AB
Hgb urine dipstick: NEGATIVE
Ketones, ur: NEGATIVE mg/dL
Leukocytes,Ua: NEGATIVE
Nitrite: NEGATIVE
Protein, ur: NEGATIVE mg/dL
Specific Gravity, Urine: 1.012 (ref 1.005–1.030)
pH: 6 (ref 5.0–8.0)

## 2021-10-26 LAB — SAMPLE TO BLOOD BANK

## 2021-10-26 LAB — LACTIC ACID, PLASMA: Lactic Acid, Venous: 1.6 mmol/L (ref 0.5–1.9)

## 2021-10-26 LAB — PROTIME-INR
INR: 1 (ref 0.8–1.2)
Prothrombin Time: 12.7 seconds (ref 11.4–15.2)

## 2021-10-26 LAB — ETHANOL: Alcohol, Ethyl (B): 172 mg/dL — ABNORMAL HIGH (ref <10)

## 2021-10-26 MED ORDER — OXYCODONE-ACETAMINOPHEN 5-325 MG PO TABS: 1.0000 | ORAL_TABLET | ORAL | 0 refills | Status: DC | PRN

## 2021-10-26 MED ORDER — CEFAZOLIN SODIUM-DEXTROSE 2-4 GM/100ML-% IV SOLN
2.0000 g | Freq: Once | INTRAVENOUS | Status: DC
  Administered 2021-10-26: 2 g via INTRAVENOUS
  Filled 2021-10-26: qty 100

## 2021-10-26 MED ORDER — FENTANYL CITRATE PF 50 MCG/ML IJ SOSY
50.0000 ug | PREFILLED_SYRINGE | Freq: Once | INTRAMUSCULAR | Status: DC
  Filled 2021-10-26: qty 1

## 2021-10-26 MED ORDER — IOHEXOL 350 MG/ML SOLN
100.0000 mL | Freq: Once | INTRAVENOUS | Status: AC | PRN
  Administered 2021-10-26: 100 mL via INTRAVENOUS

## 2021-10-26 MED ORDER — TETANUS-DIPHTH-ACELL PERTUSSIS 5-2.5-18.5 LF-MCG/0.5 IM SUSY
0.5000 mL | PREFILLED_SYRINGE | Freq: Once | INTRAMUSCULAR | Status: AC
  Administered 2021-10-26: 0.5 mL via INTRAMUSCULAR
  Filled 2021-10-26: qty 0.5

## 2021-10-26 MED ORDER — OXYCODONE-ACETAMINOPHEN 5-325 MG PO TABS
1.0000 | ORAL_TABLET | Freq: Once | ORAL | Status: AC
  Administered 2021-10-26: 1 via ORAL
  Filled 2021-10-26: qty 1

## 2021-10-26 NOTE — ED Notes (Signed)
[place] placed 
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
## 2021-10-26 NOTE — ED Notes (Signed)
Pt returned from CT °
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
## 2021-10-26 NOTE — ED Notes (Signed)
Patient stating he does not need crutches at this time.

## 2021-10-26 NOTE — ED Notes (Signed)
Patient transported to CT 
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
## 2021-11-03 NOTE — Telephone Encounter (Signed)
Advanced Heart Failure Patient Advocate Encounter  Attempted to contact patient about medication assistance. No answer, left voicemail.

## 2021-11-05 ENCOUNTER — Other Ambulatory Visit (HOSPITAL_COMMUNITY): Payer: Self-pay | Admitting: *Deleted

## 2021-11-05 ENCOUNTER — Other Ambulatory Visit: Payer: Self-pay

## 2021-11-05 ENCOUNTER — Inpatient Hospital Stay (HOSPITAL_COMMUNITY): Admission: RE | Admit: 2021-11-05 | Payer: Self-pay | Source: Ambulatory Visit

## 2021-11-05 DIAGNOSIS — I509 Heart failure, unspecified: Secondary | ICD-10-CM

## 2021-11-05 MED ORDER — SPIRONOLACTONE 25 MG PO TABS
25.0000 mg | ORAL_TABLET | Freq: Every day | ORAL | 0 refills | Status: DC
  Filled 2021-11-05: qty 30, 30d supply, fill #0

## 2021-11-05 MED ORDER — ENTRESTO 97-103 MG PO TABS
1.0000 | ORAL_TABLET | Freq: Two times a day (BID) | ORAL | 0 refills | Status: DC
  Filled 2021-11-05: qty 60, 30d supply, fill #0

## 2021-11-05 MED ORDER — DAPAGLIFLOZIN PROPANEDIOL 10 MG PO TABS
10.0000 mg | ORAL_TABLET | Freq: Every day | ORAL | 0 refills | Status: DC
  Filled 2021-11-05: qty 30, 30d supply, fill #0

## 2021-11-05 MED ORDER — CARVEDILOL 3.125 MG PO TABS
3.1250 mg | ORAL_TABLET | Freq: Two times a day (BID) | ORAL | 0 refills | Status: DC
  Filled 2021-11-05: qty 60, 30d supply, fill #0

## 2021-11-06 ENCOUNTER — Other Ambulatory Visit: Payer: Self-pay

## 2021-11-13 NOTE — Telephone Encounter (Signed)
Advanced Heart Failure Patient Advocate Encounter  Third attempt to reach patient about medication assistance. No answer, left voicemail.  Will be available to assist with future medication costs, if needed.

## 2021-11-18 ENCOUNTER — Other Ambulatory Visit (HOSPITAL_COMMUNITY): Payer: Self-pay

## 2021-11-18 ENCOUNTER — Ambulatory Visit (HOSPITAL_COMMUNITY)
Admission: RE | Admit: 2021-11-18 | Discharge: 2021-11-18 | Disposition: A | Discharge status: Home / Self Care | Payer: 59 | Source: Ambulatory Visit | Attending: Internal Medicine | Admitting: Internal Medicine

## 2021-11-18 VITALS — BP 134/94 | HR 83 | Wt 290.4 lb

## 2021-11-18 DIAGNOSIS — R0683 Snoring: Secondary | ICD-10-CM | POA: Insufficient documentation

## 2021-11-18 DIAGNOSIS — I428 Other cardiomyopathies: Secondary | ICD-10-CM | POA: Insufficient documentation

## 2021-11-18 DIAGNOSIS — N1831 Chronic kidney disease, stage 3a: Secondary | ICD-10-CM | POA: Insufficient documentation

## 2021-11-18 DIAGNOSIS — Z720 Tobacco use: Secondary | ICD-10-CM | POA: Insufficient documentation

## 2021-11-18 DIAGNOSIS — I130 Hypertensive heart and chronic kidney disease with heart failure and stage 1 through stage 4 chronic kidney disease, or unspecified chronic kidney disease: Secondary | ICD-10-CM | POA: Insufficient documentation

## 2021-11-18 DIAGNOSIS — I5022 Chronic systolic (congestive) heart failure: Secondary | ICD-10-CM | POA: Insufficient documentation

## 2021-11-18 DIAGNOSIS — E669 Obesity, unspecified: Secondary | ICD-10-CM | POA: Insufficient documentation

## 2021-11-18 MED ORDER — LOSARTAN POTASSIUM 25 MG PO TABS
25.0000 mg | ORAL_TABLET | Freq: Every day | ORAL | 2 refills | Status: DC
  Filled 2021-11-18: qty 30, 30d supply, fill #0

## 2021-11-18 MED ORDER — EMPAGLIFLOZIN 10 MG PO TABS
10.0000 mg | ORAL_TABLET | Freq: Every day | ORAL | 2 refills | Status: DC
  Filled 2021-11-18: qty 30, 30d supply, fill #0

## 2021-11-18 MED ORDER — LOSARTAN POTASSIUM 25 MG PO TABS
25.0000 mg | ORAL_TABLET | Freq: Every day | ORAL | 2 refills | Status: DC
  Filled 2021-11-20: qty 30, 30d supply, fill #0

## 2021-11-18 NOTE — Patient Instructions (Signed)
It was a pleasure seeing you today!  MEDICATIONS: -We are changing your medications today -Start losartan 25 mg daily. Stop taking Entresto. Please call [phone] and request a no income attestation letter so we can work on getting you Entresto patient assistance while you start taking losartan.  -Start Jardiance 10 mg daily. Please use the 30 day free card while your patient assistance program application is being processed.  -Call if you have questions about your medications.  NEXT APPOINTMENT: Return to clinic on 12/23/2021 with Dr. Haroldine Laws.  In general, to take care of your heart failure: -Limit your fluid intake to 2 Liters (half-gallon) per day.   -Limit your salt intake to ideally 2-3 grams (2000-3000 mg) per day. -Weigh yourself daily and record, and bring that "weight diary" to your next appointment.  (Weight gain of 2-3 pounds in 1 day typically means fluid weight.) -The medications for your heart are to help your heart and help you live longer.   -Please contact us before stopping any of your heart medications.  Call the clinic at [phone] with questions or to reschedule future appointments.

## 2021-11-18 NOTE — Progress Notes (Signed)
Advanced Heart Failure Clinic Note   Primary Care: none HF Cardiologist: Dr. Haroldine Laws  HPI:  Robert Daniels is a 36 y.o. with obesity, tobacco abuse and systolic heart failure due to NICM   Admitted 03/2021 with CP and + Hs-Troponin. Echo EF 20-25%. R/LHC with normal cors, well-compensated filling pressures, EF 25%. cMRI with LVEF 15%, RVEF 33%, findings consistent with myopericarditis. Viral panel negative. Started on GDMT and colchicine. Discharged home, weight 282 lbs.   No-showed for hospital follow up.   Seen in ED 08/2021 with a/c CHF. Out of Entresto for a few months. Diuresed with IV Lasix. Echo showed  EF 20-25%, moderate LVH, RV mildly enlarged., LA moderately dilated. AHF consulted, Entresto restarted and Lasix 20 mg daily continued.    Returned for HF follow up on 09/12/2021. Overall was feeling fine. He was not SOB with activity. Denied palpitations, CP, dizziness, edema, or PND/Orthopnea. Appetite was ok. No fever or chills. Weight at home 278 pounds. He was taking all medications. Rare ETOH, smoked 3 cigs/days.  Today he returns to HF clinic for pharmacist medication titration. At last visit with APP, Entresto was increased to 97/103 mg BID and Lasix was changed from 20 mg daily to 20 mg PRN. Today, patient only provides bottles for carvedilol, spironolactone, and furosemide. He has NOT been taking Entresto or [place] due being uninsured and not completing his portion of the patient assistance applications. Did not receive the numerous phone calls from Rx Patient Advocate due to having a new phone number which is now updated in his chart. POI for [employer] application for Delene Loll has been pending since March 2023. Overall feeling good. Appetite ok. Reports palpitations have improved. Denies dizziness, lightheadedness, fatigue, chest pain or palpitations. Breathing is unchanged from last visit. He is able to walk on flat ground and up stairs without getting winded. He does not weigh  himself at home. Endorses that he has been taking Lasix 20 mg daily instead of PRN as he did not know he should only be taking PRN. No LEE. Denies PND/Orthopnea.  HF Medications: Carvedilol 3.125 mg BID Spironolactone 25 mg daily Lasix 20 mg daily  Has the patient been experiencing any side effects to the medications prescribed? No  Does the patient have any problems obtaining medications due to transportation or finances?  Yes; Uninsured; HF fund; completed Jardiance ([employer]) PAP application today. He still needs to contact Entresto [employer]) and provide no income attestation letter to complete his application. He was provided [employer] number again and is aware this is the rate limiting step to him obtaining Entresto.    Understanding of regimen: fair Understanding of indications: fair Potential of compliance: fair Patient understands to avoid NSAIDs. Patient understands to avoid decongestants.    Pertinent Lab Values: (09/12/2021) Serum creatinine 1.49, BUN 10, Potassium 3.7, Sodium 139, BNP 318.3  Vital Signs: Weight: 290.4 lbs (last clinic weight: 281.8 lbs) Blood pressure: 134/94 mmHg  Heart rate: 83   Assessment/Plan: 1. 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 - Viral panel negative - cMRI (03/2021): LVEF 15% with markedly dilated LV, RVEF 33%, with evidence of diffuse myopericarditis. - Echo (08/2021) EF 20-25%, moderate LVH, RV low/normal. - NYHA I-II, euvolemic on exam today. Decrease Lasix to 20 mg PRN. - Continue carvedilol 3.125 mg BID. - Discontinue Entresto 97/103 mg BID as patient is not taking. Start losartan 25  mg daily for now. Patient assistance for Delene Loll was started in March, however patient never provided POI necessary to complete the application. Can revisit Entresto at later date if he is able to provide POI. Provided patient phone  number for [employer]. Patient aware.  - Continue spironolactone 25 mg daily. - Discontinue Farxiga 10 mg daily as patient not taking and patient assistance application unlikely to be approved. Start Jardiance 10 mg daily. [employer] PAP application completed today. Provided 30 day free card.   - Repeat echo when GDMT optimized. - Discussed importance of medication compliance.   2. HTN - BP elevated today, start losartan as above.  - GDMT as above.   3. Tobacco use - Encouraged cessation.   4. CKD 3a - On SGLT2i.   5. Snoring - Consider sleep study when he has insurance  Follow up with Dr. Haroldine Laws for OV + echo on 12/23/2021.  Audry Riles, PharmD, BCPS, BCCP, CPP Heart Failure Clinic Pharmacist [phone]

## 2021-11-20 ENCOUNTER — Other Ambulatory Visit: Payer: Self-pay

## 2021-11-20 ENCOUNTER — Telehealth (HOSPITAL_COMMUNITY): Payer: Self-pay | Admitting: Pharmacist

## 2021-11-20 ENCOUNTER — Other Ambulatory Visit (HOSPITAL_COMMUNITY): Payer: Self-pay

## 2021-11-20 NOTE — Telephone Encounter (Signed)
Sent in Manufacturer's Assistance application to [employer] for Jardiance.   Application pending, will continue to follow.  Joey Lierman, PharmD, BCPS, BCCP, CPP Heart Failure Clinic Pharmacist [phone]  

## 2021-11-28 NOTE — Telephone Encounter (Signed)
Advanced Heart Failure Patient Advocate Encounter   Patient was approved to receive Jardiance from [employer]  Effective dates: 11/21/21 through 11/21/22  Document scanned to chart.   Archer Asa, CPhT

## 2021-12-23 ENCOUNTER — Encounter (HOSPITAL_COMMUNITY): Payer: Self-pay | Admitting: Internal Medicine

## 2021-12-23 ENCOUNTER — Ambulatory Visit (HOSPITAL_COMMUNITY)
Admission: RE | Admit: 2021-12-23 | Discharge: 2021-12-23 | Disposition: A | Discharge status: Home / Self Care | Payer: 59 | Source: Ambulatory Visit | Attending: Internal Medicine | Admitting: Internal Medicine

## 2021-12-23 ENCOUNTER — Other Ambulatory Visit (HOSPITAL_COMMUNITY): Payer: Self-pay

## 2021-12-23 ENCOUNTER — Ambulatory Visit (HOSPITAL_BASED_OUTPATIENT_CLINIC_OR_DEPARTMENT_OTHER)
Admission: RE | Admit: 2021-12-23 | Discharge: 2021-12-23 | Disposition: A | Discharge status: Home / Self Care | Payer: 59 | Source: Ambulatory Visit | Attending: Family Medicine | Admitting: Family Medicine

## 2021-12-23 VITALS — BP 108/70 | HR 82 | Wt 286.4 lb

## 2021-12-23 DIAGNOSIS — Z720 Tobacco use: Secondary | ICD-10-CM | POA: Diagnosis not present

## 2021-12-23 DIAGNOSIS — I428 Other cardiomyopathies: Secondary | ICD-10-CM | POA: Diagnosis not present

## 2021-12-23 DIAGNOSIS — I5022 Chronic systolic (congestive) heart failure: Secondary | ICD-10-CM

## 2021-12-23 DIAGNOSIS — F1010 Alcohol abuse, uncomplicated: Secondary | ICD-10-CM

## 2021-12-23 DIAGNOSIS — I509 Heart failure, unspecified: Secondary | ICD-10-CM | POA: Diagnosis not present

## 2021-12-23 DIAGNOSIS — I10 Essential (primary) hypertension: Secondary | ICD-10-CM

## 2021-12-23 DIAGNOSIS — E669 Obesity, unspecified: Secondary | ICD-10-CM | POA: Insufficient documentation

## 2021-12-23 DIAGNOSIS — I130 Hypertensive heart and chronic kidney disease with heart failure and stage 1 through stage 4 chronic kidney disease, or unspecified chronic kidney disease: Secondary | ICD-10-CM | POA: Insufficient documentation

## 2021-12-23 DIAGNOSIS — N183 Chronic kidney disease, stage 3 unspecified: Secondary | ICD-10-CM | POA: Diagnosis not present

## 2021-12-23 DIAGNOSIS — F17290 Nicotine dependence, other tobacco product, uncomplicated: Secondary | ICD-10-CM | POA: Insufficient documentation

## 2021-12-23 DIAGNOSIS — N1831 Chronic kidney disease, stage 3a: Secondary | ICD-10-CM | POA: Insufficient documentation

## 2021-12-23 DIAGNOSIS — R0683 Snoring: Secondary | ICD-10-CM | POA: Insufficient documentation

## 2021-12-23 DIAGNOSIS — Z79899 Other long term (current) drug therapy: Secondary | ICD-10-CM | POA: Insufficient documentation

## 2021-12-23 LAB — BRAIN NATRIURETIC PEPTIDE: B Natriuretic Peptide: 597.4 pg/mL — ABNORMAL HIGH (ref 0.0–100.0)

## 2021-12-23 LAB — ECHOCARDIOGRAM COMPLETE
AR max vel: 3.13 cm2
AV Area VTI: 2.88 cm2
AV Area mean vel: 2.91 cm2
AV Mean grad: 3 mmHg
AV Peak grad: 4.8 mmHg
Ao pk vel: 1.09 m/s
Area-P 1/2: 5.46 cm2
Calc EF: 24.7 %
MV M vel: 2.53 m/s
MV Peak grad: 25.5 mmHg
S' Lateral: 6.3 cm
Single Plane A2C EF: 24.4 %
Single Plane A4C EF: 25.4 %

## 2021-12-23 LAB — BASIC METABOLIC PANEL
Anion gap: 10 (ref 5–15)
BUN: 10 mg/dL (ref 6–20)
CO2: 23 mmol/L (ref 22–32)
Calcium: 8.8 mg/dL — ABNORMAL LOW (ref 8.9–10.3)
Chloride: 106 mmol/L (ref 98–111)
Creatinine, Ser: 1.27 mg/dL — ABNORMAL HIGH (ref 0.61–1.24)
GFR, Estimated: >60 mL/min (ref >60)
Glucose, Bld: 93 mg/dL (ref 70–99)
Potassium: 3.8 mmol/L (ref 3.5–5.1)
Sodium: 139 mmol/L (ref 135–145)

## 2021-12-23 MED ORDER — PERFLUTREN LIPID MICROSPHERE
1.0000 mL | INTRAVENOUS | Status: DC | PRN
  Administered 2021-12-23: 3 mL via INTRAVENOUS

## 2021-12-23 MED ORDER — CARVEDILOL 6.25 MG PO TABS
6.2500 mg | ORAL_TABLET | Freq: Two times a day (BID) | ORAL | 3 refills | Status: DC
  Filled 2021-12-23 – 2022-01-22 (×2): qty 60, 30d supply, fill #0

## 2021-12-23 NOTE — Progress Notes (Signed)
ADVANCED HF CLINIC NOTE  Primary Care: none HF Cardiologist: Dr. Gala Romney  HPI: Robert Daniels is a 36 y.o. 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 Echo EF 20-25%. R/LHC with normal cors, well-compensated filling pressures, EF 25%. cMRI with LVEF 15%, RVEF 33%, findings consistent with myopericarditis. Viral panel negative. Started on GDMT and colchicine. Discharged home, weight 282 lbs.  No-showed for hospital follow up.  Admitted 8/23 with a/c CHF. Out of Entresto for a few months. Diuresed with IV lasix. Echo showed  EF 20-25%, moderate LVH, RV mildly enlarged., LA moderately dilated. AHF consulted, Entresto restarted and Lasix 20 mg daily continued.   Has been following with NP and PharmD Clinics. HF meds titrated.  Today he returns for HF follow up. Says he feels fine. Says he moves around a lot. Mild SOB if he hurries. No edema, orthopnea or PND.    Echo today 12/23/21 EF 20-25% RV mildly reduced Personally reviewed    Cardiac Studies  - Echo (8/23): EF 20-25%, moderate LVH, RV low/normal.  - cMRI (3/23): EF 15% with markedly dilated LV with evidence of diffuse myopericarditis. RV 33%   - R/LHC (3/23): normal cors EF 25%  RA = 3 RV = 32/10 PA = 34/20 (26) PCW = 17 Fick cardiac output/index = 6.1/2.5 PVR = 1.5 WU Ao sat = 94% PA sat = 68%, 68%  Past Medical History:  Diagnosis Date   AKI (acute kidney injury) (HCC)    CHF (congestive heart failure) (HCC)    Gout     Current Outpatient Medications  Medication Sig Dispense Refill   carvedilol (COREG) 3.125 MG tablet Take 1 tablet (3.125 mg total) by mouth 2 (two) times daily with a meal. 60 tablet 0   empagliflozin (JARDIANCE) 10 MG TABS tablet Take 1 tablet (10 mg total) by mouth daily before breakfast. 30 tablet 2   furosemide (LASIX) 20 MG tablet Take 1 tablet (20 mg total) by mouth daily as needed for fluid or edema. 30 tablet 5    losartan (COZAAR) 25 MG tablet Take 1 tablet (25 mg total) by mouth daily. 30 tablet 2   spironolactone (ALDACTONE) 25 MG tablet Take 1 tablet (25 mg total) by mouth daily. 30 tablet 0   No current facility-administered medications for this encounter.   Facility-Administered Medications Ordered in Other Encounters  Medication Dose Route Frequency Provider Last Rate Last Admin   perflutren lipid microspheres (DEFINITY) IV suspension  1-10 mL Intravenous PRN Jacklynn Ganong, FNP   3 mL at 12/23/21 1105   No Known Allergies  Social History   Socioeconomic History   Marital status: Single    Spouse name: Not on file   Number of children: Not on file   Years of education: Not on file   Highest education level: Not on file  Occupational History   Not on file  Tobacco Use   Smoking status: Former    Types: Cigars, Cigarettes   Smokeless tobacco: Never  Vaping Use   Vaping Use: Never used  Substance and Sexual Activity   Alcohol use: Yes    Comment: occ   Drug use: Never   Sexual activity: Not on file  Other Topics Concern   Not on file  Social History Narrative   Not on file   Social Determinants of Health   Financial Resource Strain: High Risk (03/21/2021)   Overall Financial Resource Strain (CARDIA)  Difficulty of Paying Living Expenses: Hard  Food Insecurity: Not on file  Transportation Needs: Not on file  Physical Activity: Not on file  Stress: Not on file  Social Connections: Not on file  Intimate Partner Violence: Not on file   History reviewed. No pertinent family history.  BP 108/70   Pulse 82   Wt 129.9 kg (286 lb 6.4 oz)   SpO2 97%   BMI 38.84 kg/m   Wt Readings from Last 3 Encounters:  12/23/21 129.9 kg (286 lb 6.4 oz)  11/18/21 131.7 kg (290 lb 6.4 oz)  09/12/21 127.8 kg (281 lb 12.8 oz)   PHYSICAL EXAM: General:  NAD. No resp difficulty HEENT: Normal Neck: Supple. No JVD. Carotids 2+ bilat; no bruits. No lymphadenopathy or thryomegaly  appreciated. Cor: PMI nondisplaced. Regular rate & rhythm. No rubs, gallops or murmurs. Lungs: Clear Abdomen: Soft, nontender, nondistended. No hepatosplenomegaly. No bruits or masses. Good bowel sounds. Extremities: No cyanosis, clubbing, rash, edema Neuro: Alert & oriented x 3, cranial nerves grossly intact. Moves all 4 extremities w/o difficulty. Affect pleasant.  ECG (personally reviewed): NSR with PVC  ASSESSMENT & PLAN: 1. 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 - Viral panel negative - cMRI (3/23): LVEF 15% with markedly dilated LV, RVEF 33%, with evidence of diffuse myopericarditis. - Echo (8/23) EF 20-25%, moderate LVH, RV low/normal. - Echo today 12/23/21 EF 20-25% RV mildly reduced Personally reviewed - NYHA I-II, volume looks good today on lasix 20mg  prn  - Continue Entresto 97/103 mg bid. - Continue spironolactone 25 mg daily. - Increase carvedilol to  6.25 mg bid. - Continue Jaradiance 10 mg daily. - Labs today - F/u 3 months with echo    2. HTN - Blood pressure well controlled. Continue current regimen.  3. Tobacco/ETOH use - Smoking a few cigs/day - Couple shots of vodka every other day    4. CKD 3a - On SGLT2i. - Labs today  5. Snoring - Consider sleep study when he has insurance.  , MD  12:06 PM

## 2021-12-23 NOTE — Progress Notes (Signed)
ADVANCED HF CLINIC NOTE  Primary Care: none HF Cardiologist: Dr. Gala Romney  HPI: Mr. Buchta is a 36 y.o. 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 Echo EF 20-25%. R/LHC with normal cors, well-compensated filling pressures, EF 25%. cMRI with LVEF 15%, RVEF 33%, findings consistent with myopericarditis. Viral panel negative. Started on GDMT and colchicine. Discharged home, weight 282 lbs.  No-showed for hospital follow up.  Seen in ED 8/23 with a/c CHF. Out of Entresto for a few months. Diuresed with IV lasix. Echo showed  EF 20-25%, moderate LVH, RV mildly enlarged., LA moderately dilated. AHF consulted, Entresto restarted and Lasix 20 mg daily continued.   Today he returns for HF follow up. Overall feeling fine. He is not SOB with activity. Denies palpitations, CP, dizziness, edema, or PND/Orthopnea. Appetite ok. No fever or chills. Weight at home 278 pounds. Taking all medications. Rare ETOH, smokes 3 cigs/days.   Cardiac Studies  - Echo (8/23): EF 20-25%, moderate LVH, RV low/normal.  - cMRI (3/23): EF 15% with markedly dilated LV with evidence of diffuse myopericarditis. RV 33%   - R/LHC (3/23): normal cors EF 25%  RA = 3 RV = 32/10 PA = 34/20 (26) PCW = 17 Fick cardiac output/index = 6.1/2.5 PVR = 1.5 WU Ao sat = 94% PA sat = 68%, 68%  Past Medical History:  Diagnosis Date   AKI (acute kidney injury) (HCC)    CHF (congestive heart failure) (HCC)    Gout     Current Outpatient Medications  Medication Sig Dispense Refill   carvedilol (COREG) 3.125 MG tablet Take 1 tablet (3.125 mg total) by mouth 2 (two) times daily with a meal. 60 tablet 0   empagliflozin (JARDIANCE) 10 MG TABS tablet Take 1 tablet (10 mg total) by mouth daily before breakfast. 30 tablet 2   furosemide (LASIX) 20 MG tablet Take 1 tablet (20 mg total) by mouth daily as needed for fluid or edema. 30 tablet 5   losartan (COZAAR) 25  MG tablet Take 1 tablet (25 mg total) by mouth daily. 30 tablet 2   spironolactone (ALDACTONE) 25 MG tablet Take 1 tablet (25 mg total) by mouth daily. 30 tablet 0   No current facility-administered medications for this encounter.   Facility-Administered Medications Ordered in Other Encounters  Medication Dose Route Frequency Provider Last Rate Last Admin   perflutren lipid microspheres (DEFINITY) IV suspension  1-10 mL Intravenous PRN Jacklynn Ganong, FNP   3 mL at 12/23/21 1105   No Known Allergies  Social History   Socioeconomic History   Marital status: Single    Spouse name: Not on file   Number of children: Not on file   Years of education: Not on file   Highest education level: Not on file  Occupational History   Not on file  Tobacco Use   Smoking status: Former    Types: Cigars, Cigarettes   Smokeless tobacco: Never  Vaping Use   Vaping Use: Never used  Substance and Sexual Activity   Alcohol use: Yes    Comment: occ   Drug use: Never   Sexual activity: Not on file  Other Topics Concern   Not on file  Social History Narrative   Not on file   Social Determinants of Health   Financial Resource Strain: High Risk (03/21/2021)   Overall Financial Resource Strain (CARDIA)    Difficulty of Paying Living Expenses: Hard  Food Insecurity: Not on file  Transportation Needs: Not on file  Physical Activity: Not on file  Stress: Not on file  Social Connections: Not on file  Intimate Partner Violence: Not on file   History reviewed. No pertinent family history.  BP 108/70   Pulse 82   Wt 129.9 kg (286 lb 6.4 oz)   SpO2 97%   BMI 38.84 kg/m   Wt Readings from Last 3 Encounters:  12/23/21 129.9 kg (286 lb 6.4 oz)  11/18/21 131.7 kg (290 lb 6.4 oz)  09/12/21 127.8 kg (281 lb 12.8 oz)   PHYSICAL EXAM: General:  Well appearing. No resp difficulty HEENT: normal Neck: supple. no JVD. Carotids 2+ bilat; no bruits. No lymphadenopathy or thryomegaly  appreciated. Cor: PMI nondisplaced. Regular rate & rhythm. No rubs, gallops or murmurs. Lungs: clear Abdomen: obese soft, nontender, nondistended. No hepatosplenomegaly. No bruits or masses. Good bowel sounds. Extremities: no cyanosis, clubbing, rash, edema Neuro: alert & orientedx3, cranial nerves grossly intact. moves all 4 extremities w/o difficulty. Affect pleasant  ASSESSMENT & PLAN: 1. 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 - Viral panel negative - cMRI (3/23): LVEF 15% with markedly dilated LV, RVEF 33%, with evidence of diffuse myopericarditis. - Echo (8/23) EF 20-25%, moderate LVH, RV low/normal. - Echo today 12/23/21 EF 20-25 RV mildly down Personally reviewed - Stable NYHA I-II, volume looks good today on lasix 20 mg prn - Continue Entresto 97/103 mg bid. - Continue spironolactone 25 mg daily. - Increase Coreg 6.25 mg bid. - Continue Farxiga 10 mg daily. - Labs today - Discussed possible ICD but he wants to think about  - Discussed need to stop ETOH and tobacco completely as he will likely need advanced therapies down the road - If symptoms worsen will need CPX   2. HTN - Blood pressure well controlled. Continue current regimen.   3. Tobacco/ETOH use - Encouraged cessation (see above)   4. CKD 3a - Labs today. - On SGLT2i.  5. Snoring - Consider sleep study   Arvilla Meres, MD  12:24 PM

## 2021-12-23 NOTE — Progress Notes (Signed)
  Echocardiogram 2D Echocardiogram has been performed.  Robert Daniels 12/23/2021, 11:15 AM

## 2021-12-23 NOTE — Patient Instructions (Signed)
Medication Changes:  Increase Carvedilol to 6.25 mg Twice daily   Lab Work:  Labs done today, your results will be available in MyChart, we will contact you for abnormal readings.  Testing/Procedures:  none  Referrals:  none  Special Instructions // Education:  Do the following things EVERYDAY: Weigh yourself in the morning before breakfast. Write it down and keep it in a log. Take your medicines as prescribed Eat low salt foods--Limit salt (sodium) to 2000 mg per day.  Stay as active as you can everyday Limit all fluids for the day to less than 2 liters   Follow-Up in: 2-3 weeks  At the [facility], you and your health needs are our priority. We have a designated team specialized in the treatment of Heart Failure. This Care Team includes your primary Heart Failure Specialized Cardiologist (physician), Advanced Practice Providers (APPs- Physician Assistants and Nurse Practitioners), and Pharmacist who all work together to provide you with the care you need, when you need it.   You may see any of the following providers on your designated Care Team at your next follow up:  Dr. Arvilla Meres Dr. Marca Ancona Dr. Marcos Eke, NP Robbie Lis, [place] [facility] [place], [place] Brynda Peon, NP Karle Plumber, PharmD   Please be sure to bring in all your medications bottles to every appointment.   Need to Contact us:  If you have any questions or concerns before your next appointment please send [place] a message through [place] or call our office at [phone].    TO LEAVE A MESSAGE FOR THE NURSE SELECT OPTION 2, PLEASE LEAVE A MESSAGE INCLUDING: YOUR NAME DATE OF BIRTH CALL BACK NUMBER REASON FOR CALL**this is important as we prioritize the call backs  YOU WILL RECEIVE A CALL BACK THE SAME DAY AS LONG AS YOU CALL BEFORE 4:00 PM

## 2022-01-02 ENCOUNTER — Other Ambulatory Visit (HOSPITAL_COMMUNITY): Payer: Self-pay

## 2022-01-16 ENCOUNTER — Encounter (HOSPITAL_COMMUNITY): Payer: 59

## 2022-01-16 NOTE — Progress Notes (Incomplete)
ADVANCED HF CLINIC NOTE  Primary Care: none HF Cardiologist: Dr. Haroldine Laws  HPI: Robert Daniels is a 37 y.o. 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 Echo EF 20-25%. R/LHC with normal cors, well-compensated filling pressures, EF 25%. cMRI with LVEF 15%, RVEF 33%, findings consistent with myopericarditis. Viral panel negative. Started on GDMT and colchicine. Discharged home, weight 282 lbs.  No-showed for hospital follow up.  Seen in ED 8/23 with a/c CHF. Out of Entresto for a few months. Diuresed with IV lasix. Echo showed  EF 20-25%, moderate LVH, RV mildly enlarged., LA moderately dilated. AHF consulted, Entresto restarted and Lasix 20 mg daily continued.   Today he returns for HF follow up. Overall feeling fine. He is not SOB with activity. Denies palpitations, CP, dizziness, edema, or PND/Orthopnea. Appetite ok. No fever or chills. Weight at home 278 pounds. Taking all medications. Rare ETOH, smokes 3 cigs/days.   Cardiac Studies  - Echo (8/23): EF 20-25%, moderate LVH, RV low/normal.  - cMRI (3/23): EF 15% with markedly dilated LV with evidence of diffuse myopericarditis. RV 33%   - R/LHC (3/23): normal cors EF 25%  RA = 3 RV = 32/10 PA = 34/20 (26) PCW = 17 Fick cardiac output/index = 6.1/2.5 PVR = 1.5 WU Ao sat = 94% PA sat = 68%, 68%  Past Medical History:  Diagnosis Date   AKI (acute kidney injury) ([place])    CHF (congestive heart failure) (HCC)    Gout     Current Outpatient Medications  Medication Sig Dispense Refill   carvedilol (COREG) 6.25 MG tablet Take 1 tablet (6.25 mg total) by mouth 2 (two) times daily with a meal. 60 tablet 3   empagliflozin (JARDIANCE) 10 MG TABS tablet Take 1 tablet (10 mg total) by mouth daily before breakfast. 30 tablet 2   furosemide (LASIX) 20 MG tablet Take 1 tablet (20 mg total) by mouth daily as needed for fluid or edema. 30 tablet 5   losartan (COZAAR) 25 MG  tablet Take 1 tablet (25 mg total) by mouth daily. 30 tablet 2   spironolactone (ALDACTONE) 25 MG tablet Take 1 tablet (25 mg total) by mouth daily. 30 tablet 0   No current facility-administered medications for this visit.   No Known Allergies  Social History   Socioeconomic History   Marital status: Single    Spouse name: Not on file   Number of children: Not on file   Years of education: Not on file   Highest education level: Not on file  Occupational History   Not on file  Tobacco Use   Smoking status: Former    Types: Cigars, Cigarettes   Smokeless tobacco: Never  Vaping Use   Vaping Use: Never used  Substance and Sexual Activity   Alcohol use: Yes    Comment: occ   Drug use: Never   Sexual activity: Not on file  Other Topics Concern   Not on file  Social History Narrative   Not on file   Social Determinants of Health   Financial Resource Strain: High Risk (03/21/2021)   Overall Financial Resource Strain (CARDIA)    Difficulty of Paying Living Expenses: Hard  Food Insecurity: Not on file  Transportation Needs: Not on file  Physical Activity: Not on file  Stress: Not on file  Social Connections: Not on file  Intimate Partner Violence: Not on file   No family history on  file.  There were no vitals taken for this visit.  Wt Readings from Last 3 Encounters:  12/23/21 129.9 kg (286 lb 6.4 oz)  11/18/21 131.7 kg (290 lb 6.4 oz)  09/12/21 127.8 kg (281 lb 12.8 oz)   PHYSICAL EXAM: General:  Well appearing. No resp difficulty HEENT: normal Neck: supple. no JVD. Carotids 2+ bilat; no bruits. No lymphadenopathy or thryomegaly appreciated. Cor: PMI nondisplaced. Regular rate & rhythm. No rubs, gallops or murmurs. Lungs: clear Abdomen: obese soft, nontender, nondistended. No hepatosplenomegaly. No bruits or masses. Good bowel sounds. Extremities: no cyanosis, clubbing, rash, edema Neuro: alert & orientedx3, cranial nerves grossly intact. moves all 4 extremities  w/o difficulty. Affect pleasant  ASSESSMENT & PLAN: 1. 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 - Viral panel negative - cMRI (3/23): LVEF 15% with markedly dilated LV, RVEF 33%, with evidence of diffuse myopericarditis. - Echo (8/23) EF 20-25%, moderate LVH, RV low/normal. - Echo today 12/23/21 EF 20-25 RV mildly down Personally reviewed - Stable NYHA I-II, volume looks good today on lasix 20 mg prn - Continue Entresto 97/103 mg bid. - Continue spironolactone 25 mg daily. - Increase Coreg 6.25 mg bid. - Continue Farxiga 10 mg daily. - Labs today - Discussed possible ICD but he wants to think about  - Discussed need to stop ETOH and tobacco completely as he will likely need advanced therapies down the road - If symptoms worsen will need CPX   2. HTN - Blood pressure well controlled. Continue current regimen.   3. Tobacco/ETOH use - Encouraged cessation (see above)   4. CKD 3a - Labs today. - On SGLT2i.  5. Snoring - Consider sleep study   Rafael Bihari, FNP  8:23 AM

## 2022-01-22 ENCOUNTER — Other Ambulatory Visit (HOSPITAL_COMMUNITY): Payer: Self-pay

## 2022-02-06 ENCOUNTER — Ambulatory Visit (HOSPITAL_COMMUNITY)
Admission: RE | Admit: 2022-02-06 | Discharge: 2022-02-06 | Disposition: A | Discharge status: Home / Self Care | Payer: Self-pay | Source: Ambulatory Visit | Attending: Family Medicine | Admitting: Family Medicine

## 2022-02-06 ENCOUNTER — Other Ambulatory Visit (HOSPITAL_COMMUNITY): Payer: Self-pay

## 2022-02-06 ENCOUNTER — Encounter (HOSPITAL_COMMUNITY): Payer: Self-pay

## 2022-02-06 VITALS — BP 118/82 | HR 84 | Ht 76.0 in | Wt 290.0 lb

## 2022-02-06 DIAGNOSIS — Z720 Tobacco use: Secondary | ICD-10-CM

## 2022-02-06 DIAGNOSIS — N1831 Chronic kidney disease, stage 3a: Secondary | ICD-10-CM | POA: Insufficient documentation

## 2022-02-06 DIAGNOSIS — Z6835 Body mass index (BMI) 35.0-35.9, adult: Secondary | ICD-10-CM | POA: Insufficient documentation

## 2022-02-06 DIAGNOSIS — I428 Other cardiomyopathies: Secondary | ICD-10-CM | POA: Insufficient documentation

## 2022-02-06 DIAGNOSIS — F1010 Alcohol abuse, uncomplicated: Secondary | ICD-10-CM

## 2022-02-06 DIAGNOSIS — N183 Chronic kidney disease, stage 3 unspecified: Secondary | ICD-10-CM

## 2022-02-06 DIAGNOSIS — Z139 Encounter for screening, unspecified: Secondary | ICD-10-CM

## 2022-02-06 DIAGNOSIS — Z79899 Other long term (current) drug therapy: Secondary | ICD-10-CM | POA: Insufficient documentation

## 2022-02-06 DIAGNOSIS — E669 Obesity, unspecified: Secondary | ICD-10-CM | POA: Insufficient documentation

## 2022-02-06 DIAGNOSIS — I5022 Chronic systolic (congestive) heart failure: Secondary | ICD-10-CM | POA: Insufficient documentation

## 2022-02-06 DIAGNOSIS — I130 Hypertensive heart and chronic kidney disease with heart failure and stage 1 through stage 4 chronic kidney disease, or unspecified chronic kidney disease: Secondary | ICD-10-CM | POA: Insufficient documentation

## 2022-02-06 DIAGNOSIS — R0602 Shortness of breath: Secondary | ICD-10-CM | POA: Insufficient documentation

## 2022-02-06 DIAGNOSIS — R0683 Snoring: Secondary | ICD-10-CM | POA: Insufficient documentation

## 2022-02-06 DIAGNOSIS — Z7984 Long term (current) use of oral hypoglycemic drugs: Secondary | ICD-10-CM | POA: Insufficient documentation

## 2022-02-06 DIAGNOSIS — M109 Gout, unspecified: Secondary | ICD-10-CM | POA: Insufficient documentation

## 2022-02-06 DIAGNOSIS — F17291 Nicotine dependence, other tobacco product, in remission: Secondary | ICD-10-CM | POA: Insufficient documentation

## 2022-02-06 DIAGNOSIS — I10 Essential (primary) hypertension: Secondary | ICD-10-CM

## 2022-02-06 DIAGNOSIS — Z5986 Financial insecurity: Secondary | ICD-10-CM | POA: Insufficient documentation

## 2022-02-06 LAB — BASIC METABOLIC PANEL
Anion gap: 7 (ref 5–15)
BUN: 10 mg/dL (ref 6–20)
CO2: 23 mmol/L (ref 22–32)
Calcium: 8.5 mg/dL — ABNORMAL LOW (ref 8.9–10.3)
Chloride: 110 mmol/L (ref 98–111)
Creatinine, Ser: 1.39 mg/dL — ABNORMAL HIGH (ref 0.61–1.24)
GFR, Estimated: >60 mL/min (ref >60)
Glucose, Bld: 103 mg/dL — ABNORMAL HIGH (ref 70–99)
Potassium: 3.8 mmol/L (ref 3.5–5.1)
Sodium: 140 mmol/L (ref 135–145)

## 2022-02-06 LAB — BRAIN NATRIURETIC PEPTIDE: B Natriuretic Peptide: 487.2 pg/mL — ABNORMAL HIGH (ref 0.0–100.0)

## 2022-02-06 MED ORDER — CARVEDILOL 6.25 MG PO TABS
9.3750 mg | ORAL_TABLET | Freq: Two times a day (BID) | ORAL | 8 refills | Status: DC
  Filled 2022-02-06 – 2022-03-18 (×2): qty 90, 30d supply, fill #0
  Filled 2022-04-23: qty 90, 30d supply, fill #1
  Filled 2022-05-25: qty 90, 30d supply, fill #2
  Filled 2022-06-17: qty 90, 30d supply, fill #3
  Filled 2022-08-03: qty 90, 30d supply, fill #4
  Filled 2022-11-04: qty 90, 30d supply, fill #5

## 2022-02-06 NOTE — Patient Instructions (Addendum)
Thank you for coming in today  Labs were done today, if any labs are abnormal the clinic will call you No news is good news  INCREASE Coreg to 9.375 mg 1 1/2 tablet twice daily  Your physician recommends that you schedule a follow-up appointment in:  3 months with Dr. Haroldine Laws with echocardiogram  Your physician has requested that you have an echocardiogram. Echocardiography is a painless test that uses sound waves to create images of your heart. It provides your doctor with information about the size and shape of your heart and how well your heart's chambers and valves are working. This procedure takes approximately one hour. There are no restrictions for this procedure.   You have been provided a primary care physician list     Do the following things EVERYDAY: Weigh yourself in the morning before breakfast. Write it down and keep it in a log. Take your medicines as prescribed Eat low salt foods--Limit salt (sodium) to 2000 mg per day.  Stay as active as you can everyday Limit all fluids for the day to less than 2 liters  At the [facility], you and your health needs are our priority. As part of our continuing mission to provide you with exceptional heart care, we have created designated Provider Care Teams. These Care Teams include your primary Cardiologist (physician) and Advanced Practice Providers (APPs- Physician Assistants and Nurse Practitioners) who all work together to provide you with the care you need, when you need it.   You may see any of the following providers on your designated Care Team at your next follow up: Dr Glori Bickers Dr Loralie Champagne Dr. Roxana Hires, NP Lyda Jester, [place] [facility] [place], [place] Forestine Na, NP Audry Riles, PharmD   Please be sure to bring in all your medications bottles to every appointment.    Thank you for choosing [facility]   If you  have any questions or concerns before your next appointment please send [place] a message through [place] or call our office at [phone].    TO LEAVE A MESSAGE FOR THE NURSE SELECT OPTION 2, PLEASE LEAVE A MESSAGE INCLUDING: YOUR NAME DATE OF BIRTH CALL BACK NUMBER REASON FOR CALL**this is important as we prioritize the call backs  YOU WILL RECEIVE A CALL BACK THE SAME DAY AS LONG AS YOU CALL BEFORE 4:00 PM

## 2022-02-06 NOTE — Progress Notes (Addendum)
ADVANCED HF CLINIC NOTE  Primary Care: none HF Cardiologist: Dr. Haroldine Laws  HPI: Robert Daniels is a 37 y.o. 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 Echo EF 20-25%. R/LHC with normal cors, well-compensated filling pressures, EF 25%. cMRI with LVEF 15%, RVEF 33%, findings consistent with myopericarditis. Viral panel negative. Started on GDMT and colchicine. Discharged home, weight 282 lbs.  No-showed for hospital follow up.  Seen in ED 8/23 with a/c CHF. Out of Entresto for a few months. Diuresed with IV lasix. Echo showed  EF 20-25%, moderate LVH, RV mildly enlarged., LA moderately dilated. AHF consulted, Entresto restarted and Lasix 20 mg daily continued.   Today he returns for HF follow up. Overall feeling fine. He gets SOB walking longer distances on flat ground. He works Engineer, materials. Denies palpitations, CP, dizziness, edema, or PND/Orthopnea. Appetite ok. No fever or chills. He has not been weighing at home. Taking all medications. He snores. Takes lasix every 2-3 days, makes him feel better. Stopped smoking 01/12/22, drinks 2-3x/week, 2-3 shots at a time. He has 5 children.   Cardiac Studies  - Echo (8/23): EF 20-25%, moderate LVH, RV low/normal.  - cMRI (3/23): EF 15% with markedly dilated LV with evidence of diffuse myopericarditis. RV 33%   - R/LHC (3/23): normal cors EF 25%  RA = 3 RV = 32/10 PA = 34/20 (26) PCW = 17 Fick cardiac output/index = 6.1/2.5 PVR = 1.5 WU Ao sat = 94% PA sat = 68%, 68%  Past Medical History:  Diagnosis Date   AKI (acute kidney injury) ([place])    CHF (congestive heart failure) (HCC)    Gout    Current Outpatient Medications  Medication Sig Dispense Refill   carvedilol (COREG) 6.25 MG tablet Take 1 tablet (6.25 mg total) by mouth 2 (two) times daily with a meal. 60 tablet 3   empagliflozin (JARDIANCE) 10 MG TABS tablet Take 1 tablet (10 mg total) by mouth daily before  breakfast. 30 tablet 2   furosemide (LASIX) 20 MG tablet Take 1 tablet (20 mg total) by mouth daily as needed for fluid or edema. 30 tablet 5   losartan (COZAAR) 25 MG tablet Take 1 tablet (25 mg total) by mouth daily. 30 tablet 2   spironolactone (ALDACTONE) 25 MG tablet Take 1 tablet (25 mg total) by mouth daily. 30 tablet 0   No current facility-administered medications for this encounter.   No Known Allergies  Social History   Socioeconomic History   Marital status: Single    Spouse name: Not on file   Number of children: Not on file   Years of education: Not on file   Highest education level: Not on file  Occupational History   Not on file  Tobacco Use   Smoking status: Former    Types: Cigars, Cigarettes   Smokeless tobacco: Never  Vaping Use   Vaping Use: Never used  Substance and Sexual Activity   Alcohol use: Yes    Comment: occ   Drug use: Never   Sexual activity: Not on file  Other Topics Concern   Not on file  Social History Narrative   Not on file   Social Determinants of Health   Financial Resource Strain: High Risk (03/21/2021)   Overall Financial Resource Strain (CARDIA)    Difficulty of Paying Living Expenses: Hard  Food Insecurity: Not on file  Transportation Needs: Not on file  Physical  Activity: Not on file  Stress: Not on file  Social Connections: Not on file  Intimate Partner Violence: Not on file   No family history on file.  BP 118/82   Pulse 84   Ht 6\' 4"  (1.93 m)   Wt 131.5 kg (290 lb)   SpO2 99%   BMI 35.30 kg/m   Wt Readings from Last 3 Encounters:  02/06/22 131.5 kg (290 lb)  12/23/21 129.9 kg (286 lb 6.4 oz)  11/18/21 131.7 kg (290 lb 6.4 oz)   PHYSICAL EXAM: General:  NAD. No resp difficulty HEENT: Normal Neck: Supple. No JVD, thick neck. Carotids 2+ bilat; no bruits. No lymphadenopathy or thryomegaly appreciated. Cor: PMI nondisplaced. Regular rate & rhythm. No rubs, gallops or murmurs. Lungs: Clear Abdomen: Soft,  obese, nontender, nondistended. No hepatosplenomegaly. No bruits or masses. Good bowel sounds. Extremities: No cyanosis, clubbing, rash, edema Neuro: Alert & oriented x 3, cranial nerves grossly intact. Moves all 4 extremities w/o difficulty. Affect pleasant.  ASSESSMENT & PLAN: 1. 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 - Viral panel negative - cMRI (3/23): LVEF 15% with markedly dilated LV, RVEF 33%, with evidence of diffuse myopericarditis. - Echo (8/23) EF 20-25%, moderate LVH, RV low/normal. - Echo today 12/23/21 EF 20-25 RV mildly down  - Stable NYHA I-II, volume looks good today on lasix 20 mg prn - Increase Coreg to 9.375 mg bid. - Continue losartan 25 mg daily. - Continue spironolactone 25 mg daily. - Continue Jardiance 10 mg daily. - Labs today - Discussed possible ICD but he wants to think about  - Discussed need to stop ETOH and tobacco completely as he will likely need advanced therapies down the road - If symptoms worsen will need CPX - Repeat echo in 3 months.   2. HTN - Blood pressure well controlled.  - Med changes as above.   3. Tobacco/ETOH use - Encouraged cessation (see above).   4. CKD 3a - Labs today. - On SGLT2i.  5. Snoring - Consider sleep study  6. SDOH - He is uninsured, engage HFSW - Meds thru HF fund - He needs to provide POI to finish Entresto application, until then will keep losartan on board.  Follow up in 3 months with Dr. Haroldine Laws + echo.  Rafael Bihari, FNP  3:40 PM

## 2022-02-06 NOTE — Addendum Note (Signed)
Encounter addended by: Rafael Bihari, FNP on: 02/06/2022 5:04 PM  Actions taken: Clinical Note Signed

## 2022-02-09 ENCOUNTER — Other Ambulatory Visit: Payer: Self-pay

## 2022-02-09 ENCOUNTER — Other Ambulatory Visit (HOSPITAL_COMMUNITY): Payer: Self-pay | Admitting: Cardiology

## 2022-02-09 ENCOUNTER — Other Ambulatory Visit (HOSPITAL_COMMUNITY): Payer: Self-pay

## 2022-02-09 DIAGNOSIS — I509 Heart failure, unspecified: Secondary | ICD-10-CM

## 2022-02-09 DIAGNOSIS — I5022 Chronic systolic (congestive) heart failure: Secondary | ICD-10-CM

## 2022-02-09 MED ORDER — EMPAGLIFLOZIN 10 MG PO TABS
10.0000 mg | ORAL_TABLET | Freq: Every day | ORAL | 2 refills | Status: DC
  Filled 2022-02-09 – 2022-06-17 (×2): qty 30, 30d supply, fill #0

## 2022-02-09 MED ORDER — LOSARTAN POTASSIUM 25 MG PO TABS
25.0000 mg | ORAL_TABLET | Freq: Every day | ORAL | 2 refills | Status: DC
  Filled 2022-02-09: qty 30, 30d supply, fill #0
  Filled 2022-03-18: qty 30, 30d supply, fill #1
  Filled 2022-05-25: qty 30, 30d supply, fill #2

## 2022-02-09 MED ORDER — SPIRONOLACTONE 25 MG PO TABS
25.0000 mg | ORAL_TABLET | Freq: Every day | ORAL | 2 refills | Status: DC
  Filled 2022-02-09: qty 30, 30d supply, fill #0
  Filled 2022-03-18: qty 30, 30d supply, fill #1
  Filled 2022-05-25: qty 30, 30d supply, fill #2

## 2022-02-09 MED ORDER — FUROSEMIDE 20 MG PO TABS
20.0000 mg | ORAL_TABLET | Freq: Every day | ORAL | 5 refills | Status: DC | PRN
  Filled 2022-02-09: qty 30, 30d supply, fill #0
  Filled 2022-03-18: qty 30, 30d supply, fill #1
  Filled 2022-05-25: qty 30, 30d supply, fill #2
  Filled 2022-06-17: qty 30, 30d supply, fill #3
  Filled 2022-08-03: qty 30, 30d supply, fill #4
  Filled 2022-11-04: qty 30, 30d supply, fill #5

## 2022-02-10 ENCOUNTER — Other Ambulatory Visit: Payer: Self-pay

## 2022-02-10 ENCOUNTER — Other Ambulatory Visit (HOSPITAL_COMMUNITY): Payer: Self-pay

## 2022-03-18 ENCOUNTER — Other Ambulatory Visit (HOSPITAL_COMMUNITY): Payer: Self-pay

## 2022-03-18 ENCOUNTER — Other Ambulatory Visit: Payer: Self-pay

## 2022-04-23 ENCOUNTER — Other Ambulatory Visit (HOSPITAL_COMMUNITY): Payer: Self-pay

## 2022-05-11 ENCOUNTER — Telehealth (HOSPITAL_COMMUNITY): Payer: Self-pay | Admitting: Vascular Surgery

## 2022-05-11 NOTE — Telephone Encounter (Signed)
Returned pt call to make f/u appt 

## 2022-05-16 IMAGING — MR MR CARD MORPHOLOGY WO/W CM
45 of 48 series · 45 of 48 positions shown · IV contrast (gadavist)
Comparison: none

CLINICAL DATA: Clinical question of myopericarditis
Study assumes BSA of 2.55 m2.

EXAM:
CARDIAC MRI
TECHNIQUE: The patient was scanned on a 1.5 Tesla GE magnet. A dedicated
cardiac coil was used. Functional imaging was done using Fiesta
sequences. [DATE], and 4 chamber views were done to assess for RWMA's.
Modified Togor rule using a short axis stack was used to
calculate an ejection fraction on a dedicated work station using
Circle software. The patient received 10 cc of Gadavist. After 10
minutes inversion recovery sequences were used to assess for
infiltration and scar tissue.
CONTRAST:  10 cc  of Gadavist

[Series 4: t2_haste_db_tra_bh · axial · 8.0mm · 1.64mm/px · 1 of 16 slices shown]
[im 1/16]
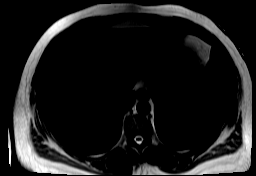

[Series 8: bSSFP · oblique · 8.0mm · 1.70mm/px · 1 of 25 slices shown (1 of 25)]
[im 1/25]
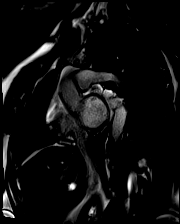

[Series 9: bSSFP · oblique · 8.0mm · 1.70mm/px · 1 of 25 slices shown (2 of 25)]
[im 1/25]
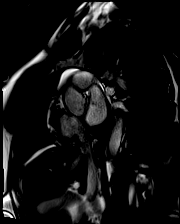

[Series 10: bSSFP · oblique · 8.0mm · 1.70mm/px · 1 of 25 slices shown (3 of 25)]
[im 1/25]
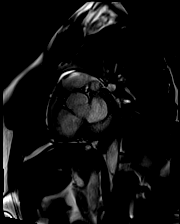

[Series 11: bSSFP · oblique · 8.0mm · 1.70mm/px · 1 of 25 slices shown (4 of 25)]
[im 1/25]
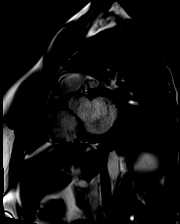

[Series 12: bSSFP · oblique · 8.0mm · 1.70mm/px · 1 of 25 slices shown (5 of 25)]
[im 1/25]
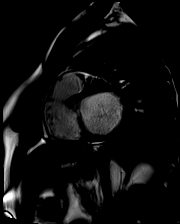

[Series 13: bSSFP · oblique · 8.0mm · 1.70mm/px · 1 of 25 slices shown (6 of 25)]
[im 1/25]
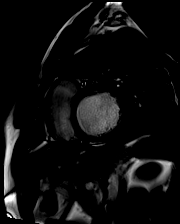

[Series 14: bSSFP · oblique · 8.0mm · 1.70mm/px · 1 of 25 slices shown (7 of 25)]
[im 1/25]
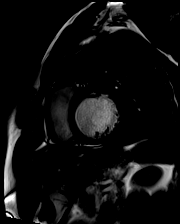

[Series 15: bSSFP · oblique · 8.0mm · 1.70mm/px · 1 of 25 slices shown (8 of 25)]
[im 1/25]
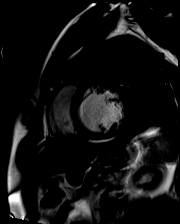

[Series 16: bSSFP · oblique · 8.0mm · 1.70mm/px · 1 of 25 slices shown (9 of 25)]
[im 1/25]
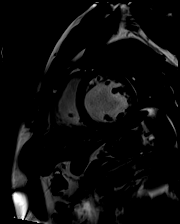

[Series 17: bSSFP · oblique · 8.0mm · 1.70mm/px · 1 of 25 slices shown (10 of 25)]
[im 1/25]
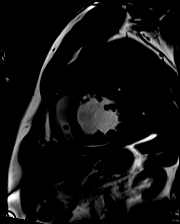

[Series 18: bSSFP · oblique · 8.0mm · 1.70mm/px · 1 of 25 slices shown (11 of 25)]
[im 1/25]
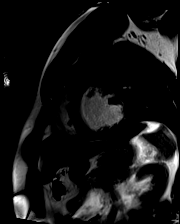

[Series 19: bSSFP · oblique · 8.0mm · 1.70mm/px · 1 of 25 slices shown (12 of 25)]
[im 1/25]
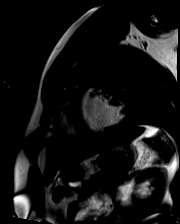

[Series 20: bSSFP · oblique · 8.0mm · 1.70mm/px · 1 of 25 slices shown (13 of 25)]
[im 1/25]
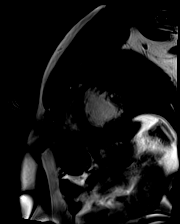

[Series 21: bSSFP · oblique · 8.0mm · 1.70mm/px · 1 of 25 slices shown (14 of 25)]
[im 1/25]
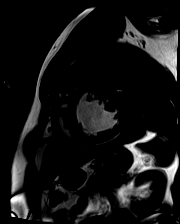

[Series 22: bSSFP · oblique · 8.0mm · 1.70mm/px · 1 of 25 slices shown (15 of 25)]
[im 1/25]
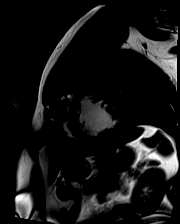

[Series 23: bSSFP · oblique · 8.0mm · 1.70mm/px · 1 of 25 slices shown (16 of 25)]
[im 1/25]
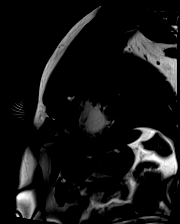

[Series 24: bSSFP · oblique · 8.0mm · 1.70mm/px · 1 of 25 slices shown (17 of 25)]
[im 1/25]
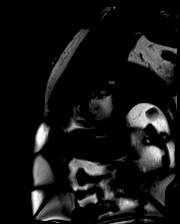

[Series 25: bSSFP · oblique · 8.0mm · 1.70mm/px · 1 of 25 slices shown (18 of 25)]
[im 1/25]
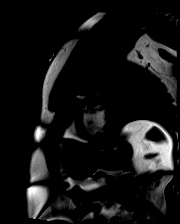

[Series 26: bSSFP · oblique · 8.0mm · 1.70mm/px · 1 of 25 slices shown (19 of 25)]
[im 1/25]
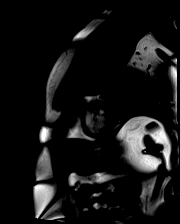

[Series 27: bSSFP · oblique · 8.0mm · 1.70mm/px · 1 of 25 slices shown (20 of 25)]
[im 1/25]
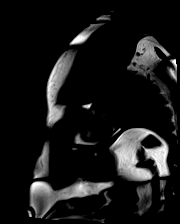

[Series 28: bSSFP · oblique · 8.0mm · 1.70mm/px · 1 of 25 slices shown (21 of 25)]
[im 1/25]
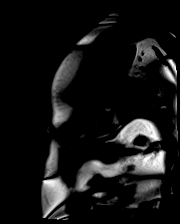

[Series 29: bSSFP · oblique · 8.0mm · 1.70mm/px · 1 of 25 slices shown (22 of 25)]
[im 1/25]
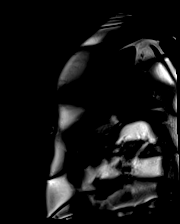

[Series 30: bSSFP · oblique · 6.0mm · 1.41mm/px · 1 of 25 slices shown (23 of 25)]
[im 1/25]
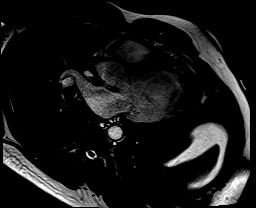

[Series 31: bSSFP · oblique · 6.0mm · 1.41mm/px · 1 of 25 slices shown (24 of 25)]
[im 1/25]
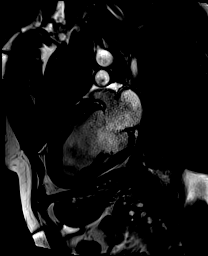

[Series 32: bSSFP · axial · 6.0mm · 1.41mm/px · 1 of 25 slices shown (25 of 25)]
[im 1/25]
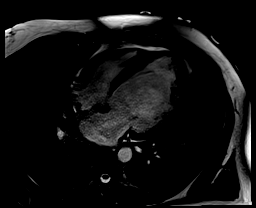

[Series 33: (id)_long_t1 · oblique · 8.0mm · 1.64mm/px · 1 of 24 slices shown]
[im 1/24]
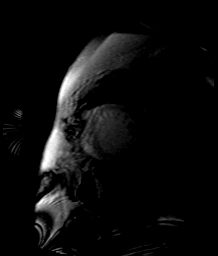

[Series 34: (id)_long_t1_moco · oblique · 8.0mm · 1.64mm/px · 1 of 24 slices shown]
[im 1/24]
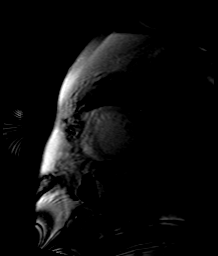

[Series 37: (id)_trufi · oblique · 8.0mm · 2.08mm/px · 1 of 9 slices shown]
[im 1/9]
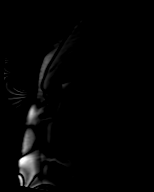

[Series 38: (id)_trufi_moco · oblique · 8.0mm · 2.08mm/px · 1 of 9 slices shown]
[im 1/9]
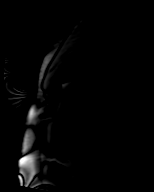

[Series 41: cine_trufi_short axis_cs_2_shot · oblique · 8.0mm · 1.64mm/px · 1 of 25 slices shown (1 of 15)]
[im 1/25]
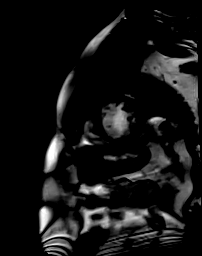

[Series 41: cine_trufi_short axis_cs_2_shot · oblique · 8.0mm · 1.64mm/px · 1 of 25 slices shown (2 of 15)]
[im 1/25]
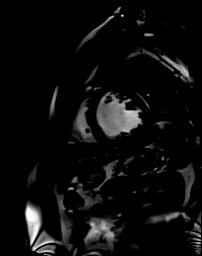

[Series 41: cine_trufi_short axis_cs_2_shot · oblique · 8.0mm · 1.64mm/px · 1 of 25 slices shown (3 of 15)]
[im 1/25]
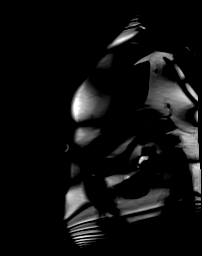

[Series 41: cine_trufi_short axis_cs_2_shot · oblique · 8.0mm · 1.64mm/px · 1 of 25 slices shown (4 of 15)]
[im 1/25]
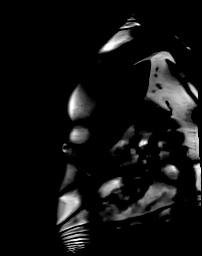

[Series 41: cine_trufi_short axis_cs_2_shot · oblique · 8.0mm · 1.64mm/px · 1 of 25 slices shown (5 of 15)]
[im 1/25]
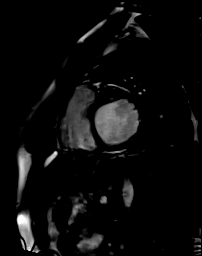

[Series 41: cine_trufi_short axis_cs_2_shot · oblique · 8.0mm · 1.64mm/px · 1 of 25 slices shown (6 of 15)]
[im 1/25]
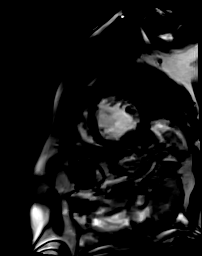

[Series 41: cine_trufi_short axis_cs_2_shot · oblique · 8.0mm · 1.64mm/px · 1 of 25 slices shown (7 of 15)]
[im 1/25]
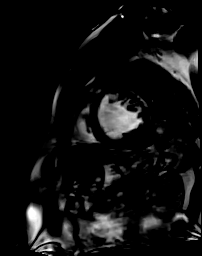

[Series 41: cine_trufi_short axis_cs_2_shot · oblique · 8.0mm · 1.64mm/px · 1 of 25 slices shown (8 of 15)]
[im 1/25]
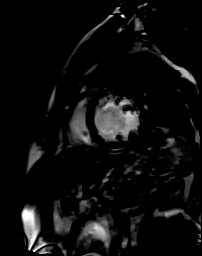

[Series 41: cine_trufi_short axis_cs_2_shot · oblique · 8.0mm · 1.64mm/px · 1 of 25 slices shown (9 of 15)]
[im 1/25]
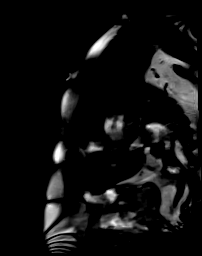

[Series 41: cine_trufi_short axis_cs_2_shot · oblique · 8.0mm · 1.64mm/px · 1 of 25 slices shown (10 of 15)]
[im 1/25]
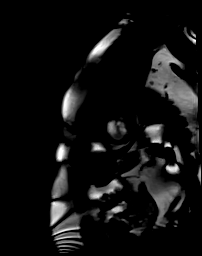

[Series 41: cine_trufi_short axis_cs_2_shot · oblique · 8.0mm · 1.64mm/px · 1 of 25 slices shown (11 of 15)]
[im 1/25]
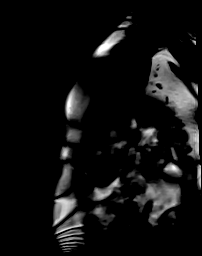

[Series 41: cine_trufi_short axis_cs_2_shot · oblique · 8.0mm · 1.64mm/px · 1 of 25 slices shown (12 of 15)]
[im 1/25]
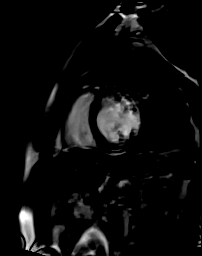

[Series 41: cine_trufi_short axis_cs_2_shot · oblique · 8.0mm · 1.64mm/px · 1 of 25 slices shown (13 of 15)]
[im 1/25]
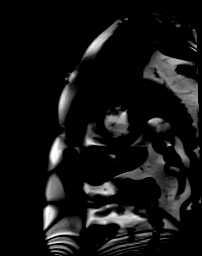

[Series 41: cine_trufi_short axis_cs_2_shot · oblique · 8.0mm · 1.64mm/px · 1 of 25 slices shown (14 of 15)]
[im 1/25]
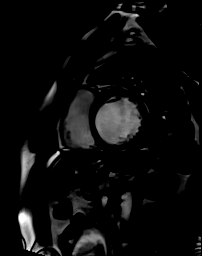

[Series 41: cine_trufi_short axis_cs_2_shot · oblique · 8.0mm · 1.64mm/px · 1 of 25 slices shown (15 of 15)]
[im 1/25]
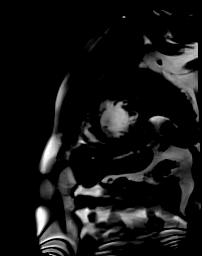

[45 of 48 positions shown; findings below may reference images not displayed]

FINDINGS: 1. Severe left ventricular dilation, with LVEDD 80 mm, and LVEDVi
170 mL/m2.

Mild asymmetric remodeling, with intraventricular septal thickness
of 12 mm, posterior wall thickness of 7 mm, but myocardial mass
index of 86 g/m2.

Severe left ventricular systolic dysfunction (LVEF =16%). Global
hypokinesis worse in the basal septum and inferior and worse in the
apex (all segments). GLS -4%. There is no evidence of LV thrombus.

Left ventricular parametric mapping notable for increased T2 Stir
signal basal inferior, mid inferior and inferolateral,
anterolateral, and anterior, and apical (all segments). Highest T2
signal 70 ms.

Increase in native T1 signal in the inferior wall, highest inferior
apex 0411 Ms.

There is late gadolinium enhancement in the left ventricular
myocardium: There is a apical-mid, inferior, mid wall LGE stripe.

2.  Mild increase in right ventricular size with RVEDVI 97 mL/m2.

Normal right ventricular thickness.

Moderately reduced right ventricular systolic function (RVEF =33%).
Septal hypokinesis.

3.  Normal left and right atrial size.

4. Normal size of the aortic root, ascending aorta and pulmonary
artery.

5. Valve assessment:

Aortic Valve:Tri-leaflet aortic valve. Qualitatively no significant
regurgitation.

Pulmonic Valve: Qualitatively no significant regurgitation.

Tricuspid Valve: Qualitatively no significant regurgitation.

Mitral Valve: Mild central regurgitation, regurgitant fraction 16%.

6. Normal pericardium. No pericardial thickening. No pericardial
effusion. There is slight pericardial enhancement over the inferior
RV.

7. Grossly, no extracardiac findings. Recommended dedicated study if
concerned for non-cardiac pathology.

8. Wrap around and breath hold artifacts noted. This decreased the
sensitivity of volumetric assessments.
IMPRESSION: 6017 Modified Delmi Angelica Criteria for Myocarditis met.

Suggestive of myopericarditis with severely reduced LVEF.

## 2022-05-25 ENCOUNTER — Other Ambulatory Visit (HOSPITAL_COMMUNITY): Payer: Self-pay

## 2022-06-17 ENCOUNTER — Other Ambulatory Visit (HOSPITAL_COMMUNITY): Payer: Self-pay | Admitting: Cardiology

## 2022-06-17 DIAGNOSIS — I509 Heart failure, unspecified: Secondary | ICD-10-CM

## 2022-06-17 DIAGNOSIS — I5022 Chronic systolic (congestive) heart failure: Secondary | ICD-10-CM

## 2022-06-18 ENCOUNTER — Other Ambulatory Visit: Payer: Self-pay

## 2022-06-18 ENCOUNTER — Other Ambulatory Visit (HOSPITAL_COMMUNITY): Payer: Self-pay

## 2022-06-18 MED ORDER — SPIRONOLACTONE 25 MG PO TABS
25.0000 mg | ORAL_TABLET | Freq: Every day | ORAL | 2 refills | Status: DC
  Filled 2022-06-18 – 2022-06-19 (×2): qty 30, 30d supply, fill #0
  Filled 2022-08-03: qty 30, 30d supply, fill #1
  Filled 2022-11-04: qty 30, 30d supply, fill #2

## 2022-06-18 MED ORDER — LOSARTAN POTASSIUM 25 MG PO TABS
25.0000 mg | ORAL_TABLET | Freq: Every day | ORAL | 2 refills | Status: DC
  Filled 2022-06-18: qty 30, 30d supply, fill #0
  Filled 2022-08-03: qty 30, 30d supply, fill #1
  Filled 2022-11-04: qty 30, 30d supply, fill #2

## 2022-06-19 ENCOUNTER — Other Ambulatory Visit (HOSPITAL_COMMUNITY): Payer: Self-pay

## 2022-06-22 ENCOUNTER — Other Ambulatory Visit (HOSPITAL_COMMUNITY): Payer: Self-pay

## 2022-06-22 ENCOUNTER — Other Ambulatory Visit: Payer: Self-pay

## 2022-06-26 ENCOUNTER — Other Ambulatory Visit (HOSPITAL_COMMUNITY): Payer: Self-pay

## 2022-08-03 ENCOUNTER — Other Ambulatory Visit (HOSPITAL_COMMUNITY): Payer: Self-pay

## 2022-08-28 ENCOUNTER — Ambulatory Visit: Payer: Self-pay

## 2022-10-27 ENCOUNTER — Telehealth (HOSPITAL_COMMUNITY): Payer: Self-pay | Admitting: Vascular Surgery

## 2022-10-27 NOTE — Telephone Encounter (Signed)
Lvm that 10/21 appt @ 1020 was moved 10/23 1040

## 2022-11-02 ENCOUNTER — Encounter (HOSPITAL_COMMUNITY): Payer: Self-pay | Admitting: Internal Medicine

## 2022-11-04 ENCOUNTER — Other Ambulatory Visit (HOSPITAL_COMMUNITY): Payer: Self-pay

## 2022-11-04 ENCOUNTER — Telehealth (HOSPITAL_COMMUNITY): Payer: Self-pay | Admitting: Pharmacy Technician

## 2022-11-04 ENCOUNTER — Encounter (HOSPITAL_COMMUNITY): Payer: Self-pay | Admitting: Internal Medicine

## 2022-11-04 ENCOUNTER — Other Ambulatory Visit: Payer: Self-pay

## 2022-11-04 ENCOUNTER — Ambulatory Visit (HOSPITAL_COMMUNITY)
Admission: RE | Admit: 2022-11-04 | Discharge: 2022-11-04 | Disposition: A | Discharge status: Home / Self Care | Payer: Self-pay | Source: Ambulatory Visit | Attending: Internal Medicine | Admitting: Internal Medicine

## 2022-11-04 VITALS — BP 120/94 | HR 92 | Wt 293.0 lb

## 2022-11-04 DIAGNOSIS — Z87891 Personal history of nicotine dependence: Secondary | ICD-10-CM | POA: Insufficient documentation

## 2022-11-04 DIAGNOSIS — N1831 Chronic kidney disease, stage 3a: Secondary | ICD-10-CM | POA: Insufficient documentation

## 2022-11-04 DIAGNOSIS — I10 Essential (primary) hypertension: Secondary | ICD-10-CM

## 2022-11-04 DIAGNOSIS — F1090 Alcohol use, unspecified, uncomplicated: Secondary | ICD-10-CM | POA: Insufficient documentation

## 2022-11-04 DIAGNOSIS — I130 Hypertensive heart and chronic kidney disease with heart failure and stage 1 through stage 4 chronic kidney disease, or unspecified chronic kidney disease: Secondary | ICD-10-CM | POA: Insufficient documentation

## 2022-11-04 DIAGNOSIS — N182 Chronic kidney disease, stage 2 (mild): Secondary | ICD-10-CM

## 2022-11-04 DIAGNOSIS — R9431 Abnormal electrocardiogram [ECG] [EKG]: Secondary | ICD-10-CM | POA: Insufficient documentation

## 2022-11-04 DIAGNOSIS — Z6835 Body mass index (BMI) 35.0-35.9, adult: Secondary | ICD-10-CM | POA: Insufficient documentation

## 2022-11-04 DIAGNOSIS — I428 Other cardiomyopathies: Secondary | ICD-10-CM | POA: Insufficient documentation

## 2022-11-04 DIAGNOSIS — I5022 Chronic systolic (congestive) heart failure: Secondary | ICD-10-CM | POA: Insufficient documentation

## 2022-11-04 DIAGNOSIS — R0683 Snoring: Secondary | ICD-10-CM | POA: Insufficient documentation

## 2022-11-04 DIAGNOSIS — F1990 Other psychoactive substance use, unspecified, uncomplicated: Secondary | ICD-10-CM

## 2022-11-04 DIAGNOSIS — Z5971 Insufficient health insurance coverage: Secondary | ICD-10-CM | POA: Insufficient documentation

## 2022-11-04 DIAGNOSIS — E669 Obesity, unspecified: Secondary | ICD-10-CM | POA: Insufficient documentation

## 2022-11-04 LAB — COMPREHENSIVE METABOLIC PANEL
ALT: 14 U/L (ref 0–44)
AST: 16 U/L (ref 15–41)
Albumin: 3.5 g/dL (ref 3.5–5.0)
Alkaline Phosphatase: 43 U/L (ref 38–126)
Anion gap: 9 (ref 5–15)
BUN: 6 mg/dL (ref 6–20)
CO2: 26 mmol/L (ref 22–32)
Calcium: 9.3 mg/dL (ref 8.9–10.3)
Chloride: 109 mmol/L (ref 98–111)
Creatinine, Ser: 1.41 mg/dL — ABNORMAL HIGH (ref 0.61–1.24)
GFR, Estimated: >60 mL/min (ref >60)
Glucose, Bld: 108 mg/dL — ABNORMAL HIGH (ref 70–99)
Potassium: 4.2 mmol/L (ref 3.5–5.1)
Sodium: 144 mmol/L (ref 135–145)
Total Bilirubin: 0.8 mg/dL (ref 0.3–1.2)
Total Protein: 6.9 g/dL (ref 6.5–8.1)

## 2022-11-04 LAB — BRAIN NATRIURETIC PEPTIDE: B Natriuretic Peptide: 959.7 pg/mL — ABNORMAL HIGH (ref 0.0–100.0)

## 2022-11-04 MED ORDER — CARVEDILOL 6.25 MG PO TABS
9.3750 mg | ORAL_TABLET | Freq: Two times a day (BID) | ORAL | 2 refills | Status: DC
  Filled 2022-11-04: qty 90, 30d supply, fill #0
  Filled 2022-12-09: qty 90, 30d supply, fill #1
  Filled 2023-02-05: qty 90, 30d supply, fill #2

## 2022-11-04 MED ORDER — LOSARTAN POTASSIUM 25 MG PO TABS
25.0000 mg | ORAL_TABLET | Freq: Every day | ORAL | 6 refills | Status: DC
  Filled 2022-11-04: qty 30, 30d supply, fill #0
  Filled 2022-12-09: qty 30, 30d supply, fill #1
  Filled 2023-02-05: qty 30, 30d supply, fill #2
  Filled 2023-03-23: qty 30, 30d supply, fill #3

## 2022-11-04 MED ORDER — FUROSEMIDE 20 MG PO TABS
20.0000 mg | ORAL_TABLET | Freq: Two times a day (BID) | ORAL | 2 refills | Status: DC
  Filled 2022-11-04: qty 60, 30d supply, fill #0
  Filled 2022-12-09: qty 60, 30d supply, fill #1
  Filled 2023-02-05: qty 60, 30d supply, fill #2

## 2022-11-04 MED ORDER — SPIRONOLACTONE 25 MG PO TABS
25.0000 mg | ORAL_TABLET | Freq: Every day | ORAL | 6 refills | Status: DC
  Filled 2022-11-04: qty 30, 30d supply, fill #0
  Filled 2022-12-09: qty 30, 30d supply, fill #1
  Filled 2023-02-05: qty 30, 30d supply, fill #2
  Filled 2023-03-23: qty 30, 30d supply, fill #3

## 2022-11-04 NOTE — Addendum Note (Signed)
Encounter addended by: Andrey Farmer, PA-C on: 11/04/2022 1:37 PM  Actions taken: Clinical Note Signed

## 2022-11-04 NOTE — Progress Notes (Signed)
H&V Care Navigation CSW Progress Note  Clinical Social Worker consulted to speak with pt about current lack of insurance.  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  Reports no savings over $2,000 and no property ownership.    Based on above should qualify for Medicaid but has never applied. Provided pt with information on how to apply for Medicaid- pt reports he will got to [facility] following this visit to apply.  CSW provided with CAFA to help with outstanding bills.    SDOH Screenings   Depression (PHQ2-9): Low Risk  (04/16/2021)  Financial Resource Strain: High Risk (03/21/2021)  Tobacco Use: Medium Risk (11/04/2022)    Burna Sis, LCSW Clinical Social Worker [facility] Desk#: [phone] Cell#: [phone]

## 2022-11-04 NOTE — Progress Notes (Addendum)
ADVANCED HF CLINIC NOTE  Primary Care: none HF Cardiologist: Dr. Gala Romney  HPI: Mr. Dunman is a 37 y.o. 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 Echo EF 20-25%. R/LHC with normal cors, well-compensated filling pressures, EF 25%. cMRI with LVEF 15%, RVEF 33%, findings consistent with myopericarditis. Viral panel negative. Started on GDMT and colchicine. Discharged home, weight 282 lbs.  No-showed for hospital follow up.  Seen in ED 8/23 with a/c CHF. Out of Entresto for a few months. Diuresed with IV lasix. Echo showed  EF 20-25%, moderate LVH, RV mildly enlarged., LA moderately dilated. AHF consulted, Entresto restarted and Lasix 20 mg daily continued.   Echo 12/23 EF 20-25%  He was seen in [facility] ED in September with shortness of breath and chest pressure after he had stopped taking his HF medications for a week.  Troponin negative X 2. ProBNP 5500. CTA chest with no PE but did show pulmonary edema. UDS + for cocaine and fentanyl. He diuresed with IV lasix and felt to be stable for discharge home.  Here today for follow-up. He is feeling better after restarting his medications. No dyspnea, orthopnea, PND or lower extremity edema. Weight tends to average around 280-285 lb. Taking all medications except for Jardiance. He doesn't know when he stopped taking the medicine. States he has not used ETOH or tobacco in at least a month. Denies drug use. He works Conservation officer, historic buildings.   Cardiac Studies  - Echo (8/23): EF 20-25%, moderate LVH, RV low/normal.  - cMRI (3/23): EF 15% with markedly dilated LV with evidence of diffuse myopericarditis. RV 33%   - R/LHC (3/23): normal cors EF 25%  RA = 3 RV = 32/10 PA = 34/20 (26) PCW = 17 Fick cardiac output/index = 6.1/2.5 PVR = 1.5 WU Ao sat = 94% PA sat = 68%, 68%  Past Medical History:  Diagnosis Date   AKI (acute kidney injury) ([facility])    CHF (congestive heart failure)  (HCC)    Gout    Current Outpatient Medications  Medication Sig Dispense Refill   carvedilol (COREG) 6.25 MG tablet Take 1 & 1/2  tablets  by mouth 2 (two) times daily with a meal. 90 tablet 2   furosemide (LASIX) 20 MG tablet Take 1 tablet (20 mg total) by mouth 2 (two) times daily. 60 tablet 2   losartan (COZAAR) 25 MG tablet Take 1 tablet (25 mg total) by mouth daily. 30 tablet 6   spironolactone (ALDACTONE) 25 MG tablet Take 1 tablet (25 mg total) by mouth daily. 30 tablet 6   No current facility-administered medications for this encounter.   No Known Allergies  Social History   Socioeconomic History   Marital status: Single    Spouse name: Not on file   Number of children: Not on file   Years of education: Not on file   Highest education level: Not on file  Occupational History   Not on file  Tobacco Use   Smoking status: Former    Types: Cigars, Cigarettes   Smokeless tobacco: Never  Vaping Use   Vaping status: Never Used  Substance and Sexual Activity   Alcohol use: Yes    Comment: occ   Drug use: Never   Sexual activity: Not on file  Other Topics Concern   Not on file  Social History Narrative   Not on file   Social Determinants of Health   Financial  Resource Strain: High Risk (03/21/2021)   Overall Financial Resource Strain (CARDIA)    Difficulty of Paying Living Expenses: Hard  Food Insecurity: Not on file  Transportation Needs: Not on file  Physical Activity: Not on file  Stress: Not on file  Social Connections: Not on file  Intimate Partner Violence: Not on file   No family history on file.  BP (!) 120/94   Pulse 92   Wt 132.9 kg (293 lb)   SpO2 95%   BMI 35.67 kg/m   Wt Readings from Last 3 Encounters:  11/04/22 132.9 kg (293 lb)  02/06/22 131.5 kg (290 lb)  12/23/21 129.9 kg (286 lb 6.4 oz)   PHYSICAL EXAM: General:  Well appearing. Ambulated into clinic. HEENT: normal Neck: supple. no JVD. Cor: PMI nondisplaced. Regular rate &  rhythm. No rubs, gallops or murmurs. Lungs: clear Abdomen: obese, soft, nontender, nondistended.  Extremities: no cyanosis, clubbing, rash, edema Neuro: alert & oriented x 3. Affect pleasant   ECG: SR 83 bpm  ASSESSMENT & PLAN: 1. 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 - Viral panel negative - cMRI (3/23): LVEF 15% with markedly dilated LV, RVEF 33%, with evidence of diffuse myopericarditis. - Echo (8/23) EF 20-25%, moderate LVH, RV low/normal. - Echo  12/23/21 EF 20-25 RV mildly down  - Stable NYHA I-II, volume looks good today. Continue lasix 20 mg BID. - Continue carvedilol 9.375 mg bid. - Continue losartan 25 mg daily. Working on [employer], continue losartan until approved. - Continue spironolactone 25 mg daily. - Restart Jardiance 10 mg daily. Provided samples, working on patient assistance. - Labs today - He would be agreeable to considering ICD but needs to demonstrate better compliance. Will refer for device once he has insurance. - Discussed need to stop ETOH and tobacco completely as he will likely need advanced therapies down the road - If symptoms worsen will need CPX - Repeat echo at follow-up in 2 months   2. HTN - Diastolic pressure elevated today - Meds as above. Hope to get on Entresto soon.   3. Substance abuse - Encouraged complete cessation from ETOH and tobacco (see above). Reports he has not used in over a month - Recent UDS 09/24 + for cocaine and fentanyl but he denies use   4. CKD II/IIIa - Labs today. - Restart SGLT2i.  5. Snoring - Consider sleep study once he has insurance  6. SDOH - He is uninsured, engage HFSW to assist with medicaid application - Meds thru HF fund - Reapply for jardiance and entresto patient assistance  Follow-up: 2 months with echo  Hildagard Sobecki N, PA-C  1:22 PM

## 2022-11-04 NOTE — Telephone Encounter (Signed)
Advanced Heart Failure Patient Advocate Encounter  Started Jardiance assistance renewal application. Will fax in once all signatures are received.

## 2022-11-04 NOTE — Telephone Encounter (Signed)
Advanced Heart Failure Patient Armed forces training and education officer for [employer] assistance.  Will fax in once all signatures are received.

## 2022-11-04 NOTE — Progress Notes (Signed)
Medication Samples have been provided to the patient.  Drug name: jardiance       Strength: 10mg         Qty: 3  LOT: [id number]  Exp.Date: 10/2024  Dosing instructions: take 1 tab daily  The patient has been instructed regarding the correct time, dose, and frequency of taking this medication, including desired effects and most common side effects.   Robert Daniels 12:07 PM 11/04/2022

## 2022-11-04 NOTE — Patient Instructions (Addendum)
Medication Changes:  RESTART Jardiance 10 mg Daily, we have provided you with some samples, please call the company to get a refill at [phone]  Continue Losartan 25 mg Daily for now, if we can get you approved for assistance we will change it to [facility] in the future. We have submitted an application today, please call the company at [phone] in about 1 week to get an attestation letter for verify no income  Continue Spironolactone, Carvedilol, and Furosemide, refills have been sent to [facility] [employer]  Lab Work:  Labs done today, your results will be available in [place], we will contact you for abnormal readings.  Testing/Procedures:  Your physician has requested that you have an echocardiogram. Echocardiography is a painless test that uses sound waves to create images of your heart. It provides your doctor with information about the size and shape of your heart and how well your heart's chambers and valves are working. This procedure takes approximately one hour. There are no restrictions for this procedure. Please do NOT wear cologne, perfume, aftershave, or lotions (deodorant is allowed). Please arrive 15 minutes prior to your appointment time.   Referrals:  none  Special Instructions // Education:  Do the following things EVERYDAY: Weigh yourself in the morning before breakfast. Write it down and keep it in a log. Take your medicines as prescribed Eat low salt foods--Limit salt (sodium) to 2000 mg per day.  Stay as active as you can everyday Limit all fluids for the day to less than 2 liters  Please apply for Medicaid   Follow-Up in: 2 months with an echocardiogram   At the [facility], you and your health needs are our priority. We have a designated team specialized in the treatment of Heart Failure. This Care Team includes your primary Heart Failure Specialized Cardiologist (physician), Advanced Practice Providers (APPs- Physician  Assistants and Nurse Practitioners), and Pharmacist who all work together to provide you with the care you need, when you need it.   You may see any of the following providers on your designated Care Team at your next follow up:  Dr. Arvilla Meres Dr. Marca Ancona Dr. Dorthula Nettles Dr. Theresia Bough Tonye Becket, NP Robbie Lis, [place] [facility] [place], [place] Brynda Peon, NP [place] Lee, NP Karle Plumber, PharmD   Please be sure to bring in all your medications bottles to every appointment.   Need to Contact us:  If you have any questions or concerns before your next appointment please send [place] a message through [place] or call our office at [phone].    TO LEAVE A MESSAGE FOR THE NURSE SELECT OPTION 2, PLEASE LEAVE A MESSAGE INCLUDING: YOUR NAME DATE OF BIRTH CALL BACK NUMBER REASON FOR CALL**this is important as we prioritize the call backs  YOU WILL RECEIVE A CALL BACK THE SAME DAY AS LONG AS YOU CALL BEFORE 4:00 PM

## 2022-11-09 ENCOUNTER — Telehealth (HOSPITAL_COMMUNITY): Payer: Self-pay | Admitting: Licensed Clinical Social Worker

## 2022-11-09 NOTE — Telephone Encounter (Signed)
CSW called pt to check in regarding progress with Medicaid application and if he had further questions regarding CAFA application provided during last appt- unable to reach- left VM requesting return call  Burna Sis, LCSW Clinical Social Worker [facility] Desk#: [phone] Cell#: [phone]

## 2022-11-13 NOTE — Telephone Encounter (Signed)
Advanced Heart Failure Patient Advocate Encounter  Sent in application via fax. Document scanned to chart.   Will follow up. 

## 2022-11-13 NOTE — Telephone Encounter (Signed)
Advanced Heart Failure Patient Advocate Encounter  Sent in [facility] Cares application via fax. Document scanned to chart.   Will follow up.

## 2022-11-25 NOTE — Telephone Encounter (Signed)
Advanced Heart Failure Patient Advocate Encounter  Called [employer] to check the status of the patient's application. Representative stated that the application has not been processed but she would process it at this time. Could not give ETA. Called and left the patient a message.   Will follow up.

## 2022-11-25 NOTE — Telephone Encounter (Signed)
Advanced Heart Failure Patient Advocate Encounter  Called and left the patient a message regarding POI requirement for [employer] assistance.  Will follow up.

## 2022-11-26 ENCOUNTER — Telehealth (HOSPITAL_COMMUNITY): Payer: Self-pay | Admitting: Licensed Clinical Social Worker

## 2022-11-26 NOTE — Telephone Encounter (Signed)
CSW called pt to check on status of applying for Medicaid- unable to reach- left VM requesting return call  Burna Sis, LCSW Clinical Social Worker [facility] Desk#: [phone] Cell#: [phone]

## 2022-12-09 ENCOUNTER — Other Ambulatory Visit (HOSPITAL_COMMUNITY): Payer: Self-pay

## 2022-12-28 ENCOUNTER — Ambulatory Visit (HOSPITAL_COMMUNITY): Admission: RE | Admit: 2022-12-28 | Payer: Self-pay | Source: Ambulatory Visit

## 2022-12-28 ENCOUNTER — Encounter (HOSPITAL_COMMUNITY): Payer: Self-pay | Admitting: Internal Medicine

## 2022-12-30 ENCOUNTER — Encounter (HOSPITAL_COMMUNITY): Payer: Self-pay

## 2022-12-30 NOTE — Telephone Encounter (Signed)
Advanced Heart Failure Patient Advocate Encounter  Attempted to call the patient several times, call did not go through, no way to leave vm. Will follow up with mychart message.  Archer Asa, CPhT

## 2023-01-12 ENCOUNTER — Encounter (HOSPITAL_COMMUNITY): Payer: Self-pay

## 2023-01-12 MED ORDER — EMPAGLIFLOZIN 10 MG PO TABS: 10.0000 mg | ORAL_TABLET | Freq: Every day | ORAL | 3 refills | Status: DC

## 2023-01-12 NOTE — Telephone Encounter (Signed)
 Advanced Heart Failure Patient Advocate Encounter   Called [employer] to check the status of patient's application. Patient was approved to receive Jardiance  from [employer]  Effective dates: 11/25/22 through 11/25/23  Phone number on file not in service. Followed up with mychart message.  Almarie JULIANNA Pa, CPhT

## 2023-02-05 ENCOUNTER — Other Ambulatory Visit (HOSPITAL_COMMUNITY): Payer: Self-pay

## 2023-03-04 ENCOUNTER — Emergency Department
Admission: EM | Admit: 2023-03-04 | Discharge: 2023-03-04 | Disposition: A | Discharge status: Home / Self Care | Payer: Self-pay | Attending: Emergency Medicine | Admitting: Emergency Medicine

## 2023-03-04 ENCOUNTER — Other Ambulatory Visit: Payer: Self-pay

## 2023-03-04 ENCOUNTER — Encounter: Payer: Self-pay | Admitting: Emergency Medicine

## 2023-03-04 ENCOUNTER — Emergency Department: Payer: Self-pay

## 2023-03-04 DIAGNOSIS — D649 Anemia, unspecified: Secondary | ICD-10-CM | POA: Diagnosis not present

## 2023-03-04 DIAGNOSIS — I509 Heart failure, unspecified: Secondary | ICD-10-CM | POA: Diagnosis not present

## 2023-03-04 DIAGNOSIS — I110 Hypertensive heart disease with heart failure: Secondary | ICD-10-CM | POA: Insufficient documentation

## 2023-03-04 DIAGNOSIS — R0602 Shortness of breath: Secondary | ICD-10-CM | POA: Diagnosis present

## 2023-03-04 HISTORY — DX: Heart failure, unspecified: I50.9

## 2023-03-04 HISTORY — DX: Essential (primary) hypertension: I10

## 2023-03-04 LAB — CBC
HCT: 36.3 % — ABNORMAL LOW (ref 39.0–52.0)
Hemoglobin: 12.1 g/dL — ABNORMAL LOW (ref 13.0–17.0)
MCH: 30.9 pg (ref 26.0–34.0)
MCHC: 33.3 g/dL (ref 30.0–36.0)
MCV: 92.6 fL (ref 80.0–100.0)
Platelets: 206 10*3/uL (ref 150–400)
RBC: 3.92 MIL/uL — ABNORMAL LOW (ref 4.22–5.81)
RDW: 15.2 % (ref 11.5–15.5)
WBC: 9.5 10*3/uL (ref 4.0–10.5)
nRBC: 0.2 % (ref 0.0–0.2)

## 2023-03-04 LAB — HEPATIC FUNCTION PANEL
ALT: 33 U/L (ref 0–44)
AST: 33 U/L (ref 15–41)
Albumin: 3.8 g/dL (ref 3.5–5.0)
Alkaline Phosphatase: 35 U/L — ABNORMAL LOW (ref 38–126)
Bilirubin, Direct: 0.2 mg/dL (ref 0.0–0.2)
Indirect Bilirubin: 1.3 mg/dL — ABNORMAL HIGH (ref 0.3–0.9)
Total Bilirubin: 1.5 mg/dL — ABNORMAL HIGH (ref 0.0–1.2)
Total Protein: 6.7 g/dL (ref 6.5–8.1)

## 2023-03-04 LAB — BASIC METABOLIC PANEL
Anion gap: 8 (ref 5–15)
BUN: 15 mg/dL (ref 6–20)
CO2: 22 mmol/L (ref 22–32)
Calcium: 9.1 mg/dL (ref 8.9–10.3)
Chloride: 107 mmol/L (ref 98–111)
Creatinine, Ser: 1.47 mg/dL — ABNORMAL HIGH (ref 0.61–1.24)
GFR, Estimated: >60 mL/min (ref >60)
Glucose, Bld: 118 mg/dL — ABNORMAL HIGH (ref 70–99)
Potassium: 4.1 mmol/L (ref 3.5–5.1)
Sodium: 137 mmol/L (ref 135–145)

## 2023-03-04 LAB — TROPONIN I (HIGH SENSITIVITY)
Troponin I (High Sensitivity): 31 ng/L — ABNORMAL HIGH (ref <18)
Troponin I (High Sensitivity): 30 ng/L — ABNORMAL HIGH (ref <18)

## 2023-03-04 LAB — URINE DRUG SCREEN, QUALITATIVE (ARMC ONLY)
Amphetamines, Ur Screen: NOT DETECTED
Barbiturates, Ur Screen: NOT DETECTED
Benzodiazepine, Ur Scrn: NOT DETECTED
Cannabinoid 50 Ng, Ur ~~LOC~~: NOT DETECTED
Cocaine Metabolite,Ur ~~LOC~~: POSITIVE — AB
MDMA (Ecstasy)Ur Screen: NOT DETECTED
Methadone Scn, Ur: NOT DETECTED
Opiate, Ur Screen: NOT DETECTED
Phencyclidine (PCP) Ur S: NOT DETECTED
Tricyclic, Ur Screen: NOT DETECTED

## 2023-03-04 LAB — BRAIN NATRIURETIC PEPTIDE: B Natriuretic Peptide: 1259.1 pg/mL — ABNORMAL HIGH (ref 0.0–100.0)

## 2023-03-04 MED ORDER — FUROSEMIDE 10 MG/ML IJ SOLN
40.0000 mg | Freq: Once | INTRAMUSCULAR | Status: AC
  Administered 2023-03-04: 40 mg via INTRAVENOUS
  Filled 2023-03-04: qty 4

## 2023-03-04 NOTE — ED Notes (Signed)
PT to X-ray

## 2023-03-04 NOTE — ED Provider Notes (Signed)
 [facility] Provider Note    Event Date/Time   First MD Initiated Contact with Patient 03/04/23 1944     (approximate)   History   Chest Pain   HPI  Robert Daniels is a 38 y.o. male past medical history significant for CHF with an EF of 25%, hypertension, obesity, presents to the emergency department with shortness of breath and chest discomfort.  States that he has had some mild chest discomfort over the past 1 day.  Endorses progressively worsening shortness of breath and feels like his abdomen is becoming distended over the past 4 days.  States that he takes multiple medications including a blood pressure medication, spironolactone and Lasix.  Endorses compliance with these medications.  Denies any active chest pain at this time.  Prior cardiac catheterization and is followed by heart failure team in [place].  States he does appointment on Wednesday with heart failure team. Denies any alcohol use, drug use, marijuana use or nicotine use.     Physical Exam   Triage Vital Signs: ED Triage Vitals  Encounter Vitals Group     BP 03/04/23 1940 111/79     Systolic BP Percentile --      Diastolic BP Percentile --      Pulse Rate 03/04/23 1940 97     Resp 03/04/23 1940 20     Temp 03/04/23 1940 98.3 F (36.8 C)     Temp Source 03/04/23 1940 Oral     SpO2 03/04/23 1940 98 %     Weight 03/04/23 1938 287 lb (130.2 kg)     Height 03/04/23 1938 6' (1.829 m)     Head Circumference --      Peak Flow --      Pain Score --      Pain Loc --      Pain Education --      Exclude from Growth Chart --     Most recent vital signs: Vitals:   03/04/23 1940  BP: 111/79  Pulse: 97  Resp: 20  Temp: 98.3 F (36.8 C)  SpO2: 98%    Physical Exam Constitutional:      Appearance: He is well-developed. He is obese.  HENT:     Head: Atraumatic.  Eyes:     Conjunctiva/sclera: Conjunctivae normal.  Cardiovascular:     Rate and Rhythm: Regular rhythm.   Pulmonary:     Effort: No respiratory distress.  Abdominal:     Comments: Distended abdomen that is nontender to palpation  Musculoskeletal:     Cervical back: Normal range of motion.     Right lower leg: No edema.     Left lower leg: No edema.  Skin:    General: Skin is warm.     Capillary Refill: Capillary refill takes less than 2 seconds.  Neurological:     Mental Status: He is alert. Mental status is at baseline.     IMPRESSION / MDM / ASSESSMENT AND PLAN / ED COURSE  I reviewed the triage vital signs and the nursing notes.  Differential diagnosis including heart failure exacerbation, coronary vasospasm secondary to cocaine use, ACS, anemia, pneumonia  EKG  I, Corena Herter, the attending physician, personally viewed and interpreted this ECG.   Rate: Normal  Rhythm: Normal sinus  ST&T Change: None 2 PVCs.  No significant change when compared to prior EKG  No tachycardic or bradycardic dysrhythmias while on cardiac telemetry.  RADIOLOGY I independently reviewed imaging, my interpretation of imaging: Chest  x-ray with cardiomegaly and signs concerning for pulmonary edema.  LABS (all labs ordered are listed, but only abnormal results are displayed) Labs interpreted as -    Labs Reviewed  BASIC METABOLIC PANEL - Abnormal; Notable for the following components:      Result Value   Glucose, Bld 118 (*)    Creatinine, Ser 1.47 (*)    All other components within normal limits  CBC - Abnormal; Notable for the following components:   RBC 3.92 (*)    Hemoglobin 12.1 (*)    HCT 36.3 (*)    All other components within normal limits  HEPATIC FUNCTION PANEL - Abnormal; Notable for the following components:   Alkaline Phosphatase 35 (*)    Total Bilirubin 1.5 (*)    Indirect Bilirubin 1.3 (*)    All other components within normal limits  BRAIN NATRIURETIC PEPTIDE - Abnormal; Notable for the following components:   B Natriuretic Peptide 1,259.1 (*)    All other components  within normal limits  URINE DRUG SCREEN, QUALITATIVE ([facility] ONLY) - Abnormal; Notable for the following components:   Cocaine Metabolite,Ur Revloc POSITIVE (*)    All other components within normal limits  TROPONIN I (HIGH SENSITIVITY) - Abnormal; Notable for the following components:   Troponin I (High Sensitivity) 31 (*)    All other components within normal limits  TROPONIN I (HIGH SENSITIVITY) - Abnormal; Notable for the following components:   Troponin I (High Sensitivity) 30 (*)    All other components within normal limits     MDM    Initial troponin mildly elevated but serial troponins are stable.  Currently chest pain-free.  On chart review on his merged chart does have a chronically elevated troponin.  BNP elevated in 1000's and has had a history of BNP elevated as high as 5000 in the past.  Chronic anemia.  No leukocytosis.  Creatinine appears to be at his baseline.  UDS is positive for cocaine.  Given 40 mg of IV Lasix.  Patient had significant urine output on reevaluation states he is feeling much better.  States that he feels well enough to go home.  Long discussion about cessation from any cocaine use.  States that he will follow-up with his heart failure team on Wednesday.  Discussed increasing his oral Lasix dose for the next couple of days.  Discussed return to the emergency department for any worsening symptoms.  Discussed dietary changes.   PROCEDURES:  Critical Care performed: No  Procedures  Patient's presentation is most consistent with acute presentation with potential threat to life or bodily function.   MEDICATIONS ORDERED IN ED: Medications  furosemide (LASIX) injection 40 mg (40 mg Intravenous Given 03/04/23 2048)    FINAL CLINICAL IMPRESSION(S) / ED DIAGNOSES   Final diagnoses:  Acute on chronic congestive heart failure, unspecified heart failure type (HCC)     Rx / DC Orders   ED Discharge Orders     None        Note:  This document was  prepared using Dragon voice recognition software and may include unintentional dictation errors.   Corena Herter, MD 03/04/23 2244

## 2023-03-04 NOTE — Discharge Instructions (Signed)
 You were seen in the emergency department for shortness of breath.  You had a chest x-ray done that showed extra fluid on your lungs.  You are given a dose of IV Lasix in the emergency department.  Increase your Lasix dose from 20 mg twice a day to 40 mg twice a day for the next 3 days.  Call your heart failure team to schedule close follow-up appointment.  Continue to avoid alcohol, nicotine and cocaine.  Return to the emergency department for any worsening symptoms.  Thank you for choosing [place] for your health care, it was my pleasure to care for you today!  Corena Herter, MD

## 2023-03-04 NOTE — ED Notes (Signed)
 Pt ABCs intact. RR even and unlabored. Pt in NAD. Bed in lowest locked position. Call bell in reach. Denies needs at this time.

## 2023-03-04 NOTE — ED Notes (Signed)
 Heart pounding for 1 day, abdominal pain for 4 days, getting worse. Upper abdominal pain. Pt states it hurts to take deep breaths. PT states that he is not having chest pain per say, but the abdominal pain is right at the bottom of his chest. PT aox4, GCS 15. Pt ABCs intact. RR even and unlabored. Pt in NAD but looks uncomfortable. Bed in lowest locked position. Call bell in reach. Denies needs at this time.   Pt takes Spironolactone, Jardiance, and BP med.  Past Medical History:  Diagnosis Date   CHF (congestive heart failure) (HCC)    Hypertension

## 2023-03-04 NOTE — ED Triage Notes (Addendum)
 Chest pain since today, abd pain x2 days.   Per pt "I can't hardly take deep breaths, and my stomach has been hurting worse over the last few hours. It feels like my heart is beating really fast."  Hx of chf

## 2023-03-05 ENCOUNTER — Encounter (HOSPITAL_COMMUNITY): Payer: Self-pay | Admitting: Internal Medicine

## 2023-03-09 ENCOUNTER — Telehealth (HOSPITAL_COMMUNITY): Payer: Self-pay

## 2023-03-09 NOTE — Telephone Encounter (Signed)
 Called and left patient a voice message to confirm/remind patient of their appointment at the [facility] on 03/10/23.   And to bring in all medications and/or complete list.

## 2023-03-10 ENCOUNTER — Ambulatory Visit (HOSPITAL_BASED_OUTPATIENT_CLINIC_OR_DEPARTMENT_OTHER)
Admission: RE | Admit: 2023-03-10 | Discharge: 2023-03-10 | Disposition: A | Discharge status: Home / Self Care | Payer: Self-pay | Source: Ambulatory Visit | Attending: Family Medicine | Admitting: Family Medicine

## 2023-03-10 ENCOUNTER — Telehealth (HOSPITAL_COMMUNITY): Payer: Self-pay | Admitting: Pharmacy Technician

## 2023-03-10 ENCOUNTER — Ambulatory Visit (HOSPITAL_COMMUNITY)
Admission: RE | Admit: 2023-03-10 | Discharge: 2023-03-10 | Disposition: A | Discharge status: Home / Self Care | Payer: Self-pay | Source: Ambulatory Visit | Attending: Family Medicine | Admitting: Family Medicine

## 2023-03-10 ENCOUNTER — Other Ambulatory Visit (HOSPITAL_COMMUNITY): Payer: Self-pay

## 2023-03-10 ENCOUNTER — Encounter (HOSPITAL_COMMUNITY): Payer: Self-pay

## 2023-03-10 VITALS — BP 96/74 | HR 68 | Wt 299.6 lb

## 2023-03-10 DIAGNOSIS — N1831 Chronic kidney disease, stage 3a: Secondary | ICD-10-CM | POA: Diagnosis not present

## 2023-03-10 DIAGNOSIS — I10 Essential (primary) hypertension: Secondary | ICD-10-CM

## 2023-03-10 DIAGNOSIS — Z139 Encounter for screening, unspecified: Secondary | ICD-10-CM

## 2023-03-10 DIAGNOSIS — Z5986 Financial insecurity: Secondary | ICD-10-CM | POA: Insufficient documentation

## 2023-03-10 DIAGNOSIS — R0683 Snoring: Secondary | ICD-10-CM | POA: Insufficient documentation

## 2023-03-10 DIAGNOSIS — F17210 Nicotine dependence, cigarettes, uncomplicated: Secondary | ICD-10-CM | POA: Diagnosis not present

## 2023-03-10 DIAGNOSIS — Z006 Encounter for examination for normal comparison and control in clinical research program: Secondary | ICD-10-CM

## 2023-03-10 DIAGNOSIS — N183 Chronic kidney disease, stage 3 unspecified: Secondary | ICD-10-CM

## 2023-03-10 DIAGNOSIS — I081 Rheumatic disorders of both mitral and tricuspid valves: Secondary | ICD-10-CM | POA: Insufficient documentation

## 2023-03-10 DIAGNOSIS — I959 Hypotension, unspecified: Secondary | ICD-10-CM | POA: Diagnosis not present

## 2023-03-10 DIAGNOSIS — I513 Intracardiac thrombosis, not elsewhere classified: Secondary | ICD-10-CM

## 2023-03-10 DIAGNOSIS — Z79899 Other long term (current) drug therapy: Secondary | ICD-10-CM | POA: Insufficient documentation

## 2023-03-10 DIAGNOSIS — I130 Hypertensive heart and chronic kidney disease with heart failure and stage 1 through stage 4 chronic kidney disease, or unspecified chronic kidney disease: Secondary | ICD-10-CM | POA: Diagnosis not present

## 2023-03-10 DIAGNOSIS — I428 Other cardiomyopathies: Secondary | ICD-10-CM | POA: Insufficient documentation

## 2023-03-10 DIAGNOSIS — I5022 Chronic systolic (congestive) heart failure: Secondary | ICD-10-CM

## 2023-03-10 DIAGNOSIS — Z7901 Long term (current) use of anticoagulants: Secondary | ICD-10-CM | POA: Insufficient documentation

## 2023-03-10 DIAGNOSIS — F1990 Other psychoactive substance use, unspecified, uncomplicated: Secondary | ICD-10-CM

## 2023-03-10 LAB — COMPREHENSIVE METABOLIC PANEL
ALT: 27 U/L (ref 0–44)
AST: 25 U/L (ref 15–41)
Albumin: 3.8 g/dL (ref 3.5–5.0)
Alkaline Phosphatase: 38 U/L (ref 38–126)
Anion gap: 9 (ref 5–15)
BUN: 12 mg/dL (ref 6–20)
CO2: 23 mmol/L (ref 22–32)
Calcium: 8.9 mg/dL (ref 8.9–10.3)
Chloride: 106 mmol/L (ref 98–111)
Creatinine, Ser: 1.63 mg/dL — ABNORMAL HIGH (ref 0.61–1.24)
GFR, Estimated: 55 mL/min — ABNORMAL LOW (ref >60)
Glucose, Bld: 107 mg/dL — ABNORMAL HIGH (ref 70–99)
Potassium: 4 mmol/L (ref 3.5–5.1)
Sodium: 138 mmol/L (ref 135–145)
Total Bilirubin: 1.2 mg/dL (ref 0.0–1.2)
Total Protein: 7 g/dL (ref 6.5–8.1)

## 2023-03-10 LAB — ECHOCARDIOGRAM COMPLETE
Area-P 1/2: 6.54 cm2
Calc EF: 13.8 %
Est EF: 15
S' Lateral: 7.2 cm
Single Plane A2C EF: 14.2 %
Single Plane A4C EF: 15 %

## 2023-03-10 LAB — BRAIN NATRIURETIC PEPTIDE: B Natriuretic Peptide: 891.4 pg/mL — ABNORMAL HIGH (ref 0.0–100.0)

## 2023-03-10 LAB — LACTIC ACID, PLASMA: Lactic Acid, Venous: 1.3 mmol/L (ref 0.5–1.9)

## 2023-03-10 MED ORDER — TORSEMIDE 20 MG PO TABS
40.0000 mg | ORAL_TABLET | Freq: Every day | ORAL | 6 refills | Status: DC
  Filled 2023-03-10: qty 60, 30d supply, fill #0
  Filled 2023-04-08: qty 60, 30d supply, fill #1

## 2023-03-10 MED ORDER — POTASSIUM CHLORIDE CRYS ER 20 MEQ PO TBCR
40.0000 meq | EXTENDED_RELEASE_TABLET | Freq: Two times a day (BID) | ORAL | 6 refills | Status: DC
  Filled 2023-03-10: qty 60, 15d supply, fill #0
  Filled 2023-03-23: qty 60, 15d supply, fill #1

## 2023-03-10 MED ORDER — PERFLUTREN LIPID MICROSPHERE
1.0000 mL | INTRAVENOUS | Status: DC | PRN
  Administered 2023-03-10: 3 mL via INTRAVENOUS
  Filled 2023-03-10: qty 10

## 2023-03-10 MED ORDER — APIXABAN 5 MG PO TABS
5.0000 mg | ORAL_TABLET | Freq: Two times a day (BID) | ORAL | 6 refills | Status: DC
  Filled 2023-03-10: qty 60, 30d supply, fill #0
  Filled 2023-04-08: qty 60, 30d supply, fill #1

## 2023-03-10 NOTE — Patient Instructions (Addendum)
 Thank you for coming in today  If you had labs drawn today, any labs that are abnormal the clinic will call you No news is good news  Medications: STOP Coreg STOP Lasix START Torsemide 40 mg 2 tablets twice daily START Potassium 40 meq 2 tablets daily START Eliquis 5 mg 1 tablet twice daily  Follow up appointments:  Your physician recommends that you schedule a follow-up appointment in:  1-2 weeks in clinic  Next available for With Dr. Gala Romney    Do the following things EVERYDAY: Weigh yourself in the morning before breakfast. Write it down and keep it in a log. Take your medicines as prescribed Eat low salt foods--Limit salt (sodium) to 2000 mg per day.  Stay as active as you can everyday Limit all fluids for the day to less than 2 liters   At the [facility], you and your health needs are our priority. As part of our continuing mission to provide you with exceptional heart care, we have created designated Provider Care Teams. These Care Teams include your primary Cardiologist (physician) and Advanced Practice Providers (APPs- Physician Assistants and Nurse Practitioners) who all work together to provide you with the care you need, when you need it.   You may see any of the following providers on your designated Care Team at your next follow up: Dr Arvilla Meres Dr Marca Ancona Dr. Marcos Eke, NP Robbie Lis, [place] [facility] [place], [place] Brynda Peon, NP Karle Plumber, PharmD   Please be sure to bring in all your medications bottles to every appointment.    Thank you for choosing [facility]  If you have any questions or concerns before your next appointment please send [place] a message through [place] or call our office at [phone].    TO LEAVE A MESSAGE FOR THE NURSE SELECT OPTION 2, PLEASE LEAVE A MESSAGE INCLUDING: YOUR NAME DATE OF BIRTH CALL BACK NUMBER REASON FOR  CALL**this is important as we prioritize the call backs  YOU WILL RECEIVE A CALL BACK THE SAME DAY AS LONG AS YOU CALL BEFORE 4:00 PM

## 2023-03-10 NOTE — Progress Notes (Signed)
 ReDS Vest / Clip - 03/10/23 1100       ReDS Vest / Clip   Station Marker D    Ruler Value 38    ReDS Value Range High volume overload    ReDS Actual Value 52

## 2023-03-10 NOTE — Progress Notes (Signed)
 ADVANCED HF CLINIC NOTE  Primary Care: none HF Cardiologist: Dr. Gala Daniels  HPI: [name] is a 38 y.o. 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 Echo EF 20-25%. R/LHC with normal cors, well-compensated filling pressures, EF 25%. cMRI with LVEF 15%, RVEF 33%, findings consistent with myopericarditis. Viral panel negative. Started on GDMT and colchicine. Discharged home, weight 282 lbs.  No-showed for hospital follow up.  Seen in ED 8/23 with a/c CHF. Out of Entresto for a few months. Diuresed with IV lasix. Echo showed  EF 20-25%, moderate LVH, RV mildly enlarged., LA moderately dilated. AHF consulted, Entresto restarted and Lasix 20 mg daily continued.   Echo 12/23 EF 20-25%  He was seen in [facility] ED in September with shortness of breath and chest pressure after he had stopped taking his HF medications for a week.  Troponin negative X 2. ProBNP 5500. CTA chest with no PE but did show pulmonary edema. UDS + for cocaine and fentanyl. He diuresed with IV lasix and felt to be stable for discharge home.  Today he returns for HF follow up. Overall feeling fine. Has SOB walking up steps. Does ok on flat ground. Denies  palpitations, abnormal bleeding, CP, dizziness, edema, or PND/Orthopnea. Appetite ok. No fever or chills. Not weighing at home. Taking Lasix 40 bid consistently, other meds not as consistently. Smokes 2-3 cigs/day, drinks ETOH 2x/week, last used cocaine 2 weeks ago. Does not work, no issues with transportation, filled out Medicaid app last week. Has 5 children and lives with girlfriend.  Echo today 03/10/23 reviewed with Dr. Shirlee Daniels, EF < 20%, ? LV thrombus.   Cardiac Studies  - Echo (8/23): EF 20-25%, moderate LVH, RV low/normal.  - cMRI (3/23): EF 15% with markedly dilated LV with evidence of diffuse myopericarditis. RV 33%   - R/LHC (3/23): normal cors EF 25%  RA = 3 RV = 32/10 PA = 34/20 (26) PCW =  17 Fick cardiac output/index = 6.1/2.5 PVR = 1.5 WU Ao sat = 94% PA sat = 68%, 68%  Past Medical History:  Diagnosis Date   AKI (acute kidney injury) (HCC)    CHF (congestive heart failure) (HCC)    Gout    Hypertension    Current Outpatient Medications  Medication Sig Dispense Refill   carvedilol (COREG) 6.25 MG tablet Take 1 & 1/2  tablets  by mouth 2 (two) times daily with a meal. 90 tablet 2   empagliflozin (JARDIANCE) 10 MG TABS tablet Take 1 tablet (10 mg total) by mouth daily before breakfast. 90 tablet 3   furosemide (LASIX) 20 MG tablet Take 1 tablet (20 mg total) by mouth 2 (two) times daily. (Patient taking differently: Take 40 mg by mouth 2 (two) times daily.) 60 tablet 2   losartan (COZAAR) 25 MG tablet Take 1 tablet (25 mg total) by mouth daily. 30 tablet 6   spironolactone (ALDACTONE) 25 MG tablet Take 1 tablet (25 mg total) by mouth daily. 30 tablet 6   No current facility-administered medications for this encounter.   Facility-Administered Medications Ordered in Other Encounters  Medication Dose Route Frequency Provider Last Rate Last Admin   perflutren lipid microspheres (DEFINITY) IV suspension  1-10 mL Intravenous PRN Bensimhon, Robert Buckles, MD   3 mL at 03/10/23 1013   No Known Allergies  Social History   Socioeconomic History   Marital status: Single    Spouse name: Not on file  Number of children: Not on file   Years of education: Not on file   Highest education level: Not on file  Occupational History   Not on file  Tobacco Use   Smoking status: Former    Types: Cigars, Cigarettes   Smokeless tobacco: Never  Vaping Use   Vaping status: Never Used  Substance and Sexual Activity   Alcohol use: Yes    Comment: occ   Drug use: Never   Sexual activity: Not on file  Other Topics Concern   Not on file  Social History Narrative   ** Merged History Encounter **       Social Drivers of Health   Financial Resource Strain: High Risk (03/21/2021)    Overall Financial Resource Strain (CARDIA)    Difficulty of Paying Living Expenses: Hard  Food Insecurity: Not on file  Transportation Needs: Not on file  Physical Activity: Not on file  Stress: Not on file  Social Connections: Not on file  Intimate Partner Violence: Not on file   No family history on file.  BP 96/74   Pulse 68   Wt 135.9 kg (299 lb 9.6 oz)   SpO2 97%   BMI 40.63 kg/m   Wt Readings from Last 3 Encounters:  03/10/23 135.9 kg (299 lb 9.6 oz)  03/04/23 130.2 kg (287 lb)  11/04/22 132.9 kg (293 lb)   PHYSICAL EXAM: General:  NAD. No resp difficulty, walked into clinic, fatigued-appearing HEENT: Normal Neck: Supple. No JVD. Thick neck Cor: Regular rate & rhythm. No rubs, gallops or murmurs. Lungs: Clear, crackles in bases Abdomen: Soft, obese, nontender, nondistended.  Extremities: No cyanosis, clubbing, rash, 1+ BLE edema Neuro: Alert & oriented x 3, moves all 4 extremities w/o difficulty. Affect pleasant.  ReDs reading: 52%, abnormal  ECG (personally reviewed): NSR 85 bpm  ASSESSMENT & PLAN: 1. 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 - Viral panel negative - cMRI (3/23): LVEF 15% with markedly dilated LV, RVEF 33%, with evidence of diffuse myopericarditis. - Echo (8/23) EF 20-25%, moderate LVH, RV low/normal. - Echo (12/23/21): EF 20-25 RV mildly down  - Echo today 03/10/23: EF<20%, RV moderately reduced, ? thrombus - Worse NYHA II-III, volume up, REDs elevated at 52%  - With low BP and concern for shock, stop Coreg. - Stop Lasix. - Start torsemide 40 mg bid, add 20 KCL daily. - Continue losartan 25 mg daily. (He is unable to provide POI for Entresto application) - Continue spironolactone 25 mg daily. - Continue Jardiance 10 mg daily. No GU symptoms. - Not a candidate for advanced therapies or ICD until he can abstain from substances  and demonstrate better compliance - Discussed need to stop ETOH and tobacco completely as he will likely need advanced therapies down the road - If symptoms worsen will need CPX - CMET, BNP and lactic today - Likely needs RHC soon  2. LV thrombus - Echo images reviewed by Dr. Shirlee Daniels, appears to have LV clot - Start Eliquis 5 mg bid - Patient assistance started   3. HTN - BP low - Stop beta blocker as above - Labs today  4. Substance abuse - Encouraged complete cessation from ETOH and tobacco (see above).  - Past UDS + for cocaine and fentanyl; says he doesn't "use" cocaine but "touches" it.    5. CKD II/IIIa - Baseline SCr 1.2-1.4 - Continue SGLT2i - Labs  today  6. Snoring - Concern for OSA - Consider sleep study once he has insurance  7. SDOH - Medicaid app pending - Meds thru HF fund - He has transportation - Engage HFSW for resources  Follow up in 1 week with APP. He is high risk for hospitalization  Jacklynn Ganong, FNP  10:52 AM

## 2023-03-10 NOTE — Progress Notes (Signed)
 H&V Care Navigation CSW Progress Note  Pt requested to see CSW to inform that he has applied for Medicaid- he will let us know status updates regarding his case.  SDOH Screenings   Depression (PHQ2-9): Low Risk  (04/16/2021)  Financial Resource Strain: High Risk (03/21/2021)  Tobacco Use: Medium Risk (03/05/2023)   Burna Sis, LCSW Clinical Social Worker [facility] Desk#: [phone] Cell#: [phone]

## 2023-03-10 NOTE — Telephone Encounter (Signed)
 Advanced Heart Failure Patient Advocate Encounter  Patient was seen in clinic today and started on Eliquis. Patient is currently uninsured. Application for BMS started.  Will fax in once signatures are obtained.

## 2023-03-12 NOTE — Research (Signed)
 SITE: [id number]     Subject # [id number]   Subprotocol: A  Inclusion Criteria  Patients who meet all of the following criteria are eligible for enrollment as study participants:  Yes No  Age > 38 years old X   Eligible to wear Holter Study X    Exclusion Criteria  Patients who meet any of these criteria are not eligible for enrollment as study participants: Yes No  1. Receiving any mechanical (respiratory or circulatory) or renal support therapy at Screening or during Visit #1.  X  2.  Any other conditions that in the opinion of the investigators are likely to prevent compliance with the study protocol or pose a safety concern if the subject participates in the study.  X  3. Poor tolerance, namely susceptible to severe skin allergies from ECG adhesive patch application.  X   Protocol: REV H                                     Residential [address] (First 3 digits ONLY)                                             [facility] Informed Consent   Subject Name: Robert Daniels  Subject met inclusion and exclusion criteria.  The informed consent form, study requirements and expectations were reviewed with the subject. Subject had opportunity to read consent and questions and concerns were addressed prior to the signing of the consent form.  The subject verbalized understanding of the trial requirements.  The subject agreed to participate in the [facility] EF ACT trial and signed the informed consent at 10:06 on 10-Mar-2023.  The informed consent was obtained prior to performance of any protocol-specific procedures for the subject.  A copy of the signed informed consent was given to the subject and a copy was placed in the subject's medical record.   Dyanne Iha          Current Outpatient Medications:    apixaban (ELIQUIS) 5 MG TABS tablet, Take 1 tablet (5 mg total) by mouth 2 (two) times daily., Disp: 60 tablet, Rfl: 6   empagliflozin (JARDIANCE) 10 MG TABS tablet, Take 1 tablet (10 mg total)  by mouth daily before breakfast., Disp: 90 tablet, Rfl: 3   losartan (COZAAR) 25 MG tablet, Take 1 tablet (25 mg total) by mouth daily., Disp: 30 tablet, Rfl: 6   potassium chloride SA (KLOR-CON M) 20 MEQ tablet, Take 2 tablets (40 mEq total) by mouth 2 (two) times daily., Disp: 60 tablet, Rfl: 6   spironolactone (ALDACTONE) 25 MG tablet, Take 1 tablet (25 mg total) by mouth daily., Disp: 30 tablet, Rfl: 6   torsemide (DEMADEX) 20 MG tablet, Take 2 tablets (40 mg total) by mouth daily., Disp: 120 tablet, Rfl: 6

## 2023-03-16 ENCOUNTER — Telehealth (HOSPITAL_COMMUNITY): Payer: Self-pay | Admitting: *Deleted

## 2023-03-16 NOTE — Telephone Encounter (Signed)
 Called patient to remind patient of Heart Failure APP Clinic tomorrow at 1:30. Asked patient to call us at [phone] if he does not plan on attending.

## 2023-03-17 ENCOUNTER — Inpatient Hospital Stay (HOSPITAL_COMMUNITY)
Admission: RE | Admit: 2023-03-17 | Discharge: 2023-03-17 | Disposition: A | Discharge status: Home / Self Care | Payer: Self-pay | Source: Ambulatory Visit | Attending: Internal Medicine | Admitting: Internal Medicine

## 2023-03-17 ENCOUNTER — Telehealth (HOSPITAL_COMMUNITY): Payer: Self-pay

## 2023-03-17 ENCOUNTER — Encounter (HOSPITAL_COMMUNITY): Payer: Self-pay

## 2023-03-17 ENCOUNTER — Other Ambulatory Visit (HOSPITAL_COMMUNITY): Payer: Self-pay | Admitting: Internal Medicine

## 2023-03-17 ENCOUNTER — Ambulatory Visit (HOSPITAL_COMMUNITY)
Admission: RE | Admit: 2023-03-17 | Discharge: 2023-03-17 | Disposition: A | Discharge status: Home / Self Care | Payer: Self-pay | Source: Ambulatory Visit | Attending: Adult Health | Admitting: Adult Health

## 2023-03-17 VITALS — BP 115/81 | HR 111 | Ht 72.0 in | Wt 292.0 lb

## 2023-03-17 DIAGNOSIS — Z79899 Other long term (current) drug therapy: Secondary | ICD-10-CM | POA: Diagnosis not present

## 2023-03-17 DIAGNOSIS — Z7901 Long term (current) use of anticoagulants: Secondary | ICD-10-CM | POA: Insufficient documentation

## 2023-03-17 DIAGNOSIS — I493 Ventricular premature depolarization: Secondary | ICD-10-CM | POA: Insufficient documentation

## 2023-03-17 DIAGNOSIS — N1831 Chronic kidney disease, stage 3a: Secondary | ICD-10-CM | POA: Diagnosis not present

## 2023-03-17 DIAGNOSIS — E669 Obesity, unspecified: Secondary | ICD-10-CM | POA: Insufficient documentation

## 2023-03-17 DIAGNOSIS — I5022 Chronic systolic (congestive) heart failure: Secondary | ICD-10-CM | POA: Insufficient documentation

## 2023-03-17 DIAGNOSIS — Z91148 Patient's other noncompliance with medication regimen for other reason: Secondary | ICD-10-CM | POA: Insufficient documentation

## 2023-03-17 DIAGNOSIS — Z6839 Body mass index (BMI) 39.0-39.9, adult: Secondary | ICD-10-CM | POA: Diagnosis not present

## 2023-03-17 DIAGNOSIS — I10 Essential (primary) hypertension: Secondary | ICD-10-CM

## 2023-03-17 DIAGNOSIS — I130 Hypertensive heart and chronic kidney disease with heart failure and stage 1 through stage 4 chronic kidney disease, or unspecified chronic kidney disease: Secondary | ICD-10-CM | POA: Diagnosis present

## 2023-03-17 DIAGNOSIS — I513 Intracardiac thrombosis, not elsewhere classified: Secondary | ICD-10-CM

## 2023-03-17 DIAGNOSIS — R000 Tachycardia, unspecified: Secondary | ICD-10-CM | POA: Diagnosis not present

## 2023-03-17 DIAGNOSIS — Z7984 Long term (current) use of oral hypoglycemic drugs: Secondary | ICD-10-CM | POA: Insufficient documentation

## 2023-03-17 DIAGNOSIS — Z87891 Personal history of nicotine dependence: Secondary | ICD-10-CM | POA: Diagnosis not present

## 2023-03-17 DIAGNOSIS — Z5986 Financial insecurity: Secondary | ICD-10-CM | POA: Diagnosis not present

## 2023-03-17 DIAGNOSIS — I428 Other cardiomyopathies: Secondary | ICD-10-CM | POA: Diagnosis not present

## 2023-03-17 DIAGNOSIS — R0683 Snoring: Secondary | ICD-10-CM | POA: Insufficient documentation

## 2023-03-17 LAB — BASIC METABOLIC PANEL
Anion gap: 14 (ref 5–15)
BUN: 12 mg/dL (ref 6–20)
CO2: 24 mmol/L (ref 22–32)
Calcium: 9.6 mg/dL (ref 8.9–10.3)
Chloride: 102 mmol/L (ref 98–111)
Creatinine, Ser: 1.67 mg/dL — ABNORMAL HIGH (ref 0.61–1.24)
GFR, Estimated: 54 mL/min — ABNORMAL LOW (ref >60)
Glucose, Bld: 107 mg/dL — ABNORMAL HIGH (ref 70–99)
Potassium: 4.5 mmol/L (ref 3.5–5.1)
Sodium: 140 mmol/L (ref 135–145)

## 2023-03-17 LAB — BRAIN NATRIURETIC PEPTIDE: B Natriuretic Peptide: 530.8 pg/mL — ABNORMAL HIGH (ref 0.0–100.0)

## 2023-03-17 NOTE — Patient Instructions (Addendum)
 Medication Changes:  No Changes In Medications at this time.   Lab Work:  Labs done today, your results will be available in MyChart, we will contact you for abnormal readings.  Testing/Procedures:  Your provider has recommended that  you wear a Zio Patch for 7  days.  This monitor will record your heart rhythm for our review.  IF you have any symptoms while wearing the monitor please press the button.  If you have any issues with the patch or you notice a red or orange light on it please call the company at [phone].  Once you remove the patch please mail it back to the company as soon as possible so we can get the results.  Follow-Up in: 2 WEEKS AS SCHEDULED WITH APP   THEN AGAIN IN 1 MONTH DR. Gala Romney AS SCHEDULED   At the [facility], you and your health needs are our priority. We have a designated team specialized in the treatment of Heart Failure. This Care Team includes your primary Heart Failure Specialized Cardiologist (physician), Advanced Practice Providers (APPs- Physician Assistants and Nurse Practitioners), and Pharmacist who all work together to provide you with the care you need, when you need it.   You may see any of the following providers on your designated Care Team at your next follow up:  Dr. Arvilla Meres Dr. Marca Ancona Dr. Dorthula Nettles Dr. Theresia Bough Tonye Becket, NP Robbie Lis, [place] [facility], [place] Brynda Peon, NP [place] Lee, NP Karle Plumber, PharmD   Please be sure to bring in all your medications bottles to every appointment.   Need to Contact us:  If you have any questions or concerns before your next appointment please send [place] a message through [place] or call our office at [phone].    TO LEAVE A MESSAGE FOR THE NURSE SELECT OPTION 2, PLEASE LEAVE A MESSAGE INCLUDING: YOUR NAME DATE OF BIRTH CALL BACK NUMBER REASON FOR CALL**this is important as we prioritize the call backs  YOU WILL  RECEIVE A CALL BACK THE SAME DAY AS LONG AS YOU CALL BEFORE 4:00 PM

## 2023-03-17 NOTE — Telephone Encounter (Signed)
 Advanced Heart Failure Patient Advocate Encounter  Application for Eliquis faxed to [employer] on 03/17/2023. Application form attached to patient chart.  Burnell Blanks, CPhT Rx Patient Advocate Phone: [phone]

## 2023-03-17 NOTE — Progress Notes (Signed)
 ADVANCED HF CLINIC NOTE  Primary Care: none HF Cardiologist: Dr. Gala Romney  Chief Complaint: Heart Failure Follow-up HPI: [name] is a 38 y.o. 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 Echo EF 20-25%. R/LHC with normal cors, well-compensated filling pressures, EF 25%. cMRI with LVEF 15%, RVEF 33%, findings consistent with myopericarditis. Viral panel negative. Started on GDMT and colchicine. Discharged home, weight 282 lbs.  No-showed for hospital follow up.  Seen in ED 8/23 with a/c CHF. Out of Entresto for a few months. Diuresed with IV lasix. Echo showed  EF 20-25%, moderate LVH, RV mildly enlarged., LA moderately dilated. AHF consulted, Entresto restarted and Lasix 20 mg daily continued.   He was seen in [facility] ED in September with shortness of breath and chest pressure after he had stopped taking his HF medications for a week. Troponin negative X 2. ProBNP 5500. CTA chest with no PE but did show pulmonary edema. UDS + for cocaine and fentanyl. He diuresed with IV lasix and felt to be stable for discharge home.  Echo 2/25: EF <15%, RV mod reduced. Swirling LV apical contrast without discrete thrombus.  Seen last week in HF Clinic with concern for low output. Noncompliant with meds, but stated taking his Lasix. Last used cocaine 2 weeks about. Lactic acid 1.3. Switched to Torsemide, beta blocker held. Returning for close follow up.  Today he returns for HF follow up. Overall feeling fine. Denies SOB at rest, has dyspnea with steps. No palpitations, CP, dizziness, bleeding. Appetite ok. No fever or chills. Taking his diuretics consistently, however ran out of medications 2 days ago. Has not used cocaine in over 3 weeks. Smoking cigarettes. File application for Medicaid. He has 5 children and lives with his girlfriend.    Cardiac Studies - Echo (2/25): EF <15%, RV mod reduced. Swirling LV apical contrast without  discrete thrombus. - Echo (12/23) EF 20-25% - Echo (8/23): EF 20-25%, moderate LVH, RV low/normal. - cMRI (3/23): EF 15% with markedly dilated LV with evidence of diffuse myopericarditis. RV 33% - R/LHC (3/23): normal cors EF 25% . RA 3, PA 34/20 (26), PCWP 17, Fick CO/CI 6.1/2.5, PVR 1.5 wu  Past Medical History:  Diagnosis Date   AKI (acute kidney injury) (HCC)    CHF (congestive heart failure) (HCC)    Gout    Hypertension    Current Outpatient Medications  Medication Sig Dispense Refill   apixaban (ELIQUIS) 5 MG TABS tablet Take 1 tablet (5 mg total) by mouth 2 (two) times daily. 60 tablet 6   empagliflozin (JARDIANCE) 10 MG TABS tablet Take 1 tablet (10 mg total) by mouth daily before breakfast. 90 tablet 3   losartan (COZAAR) 25 MG tablet Take 1 tablet (25 mg total) by mouth daily. 30 tablet 6   potassium chloride SA (KLOR-CON M) 20 MEQ tablet Take 2 tablets (40 mEq total) by mouth 2 (two) times daily. 60 tablet 6   spironolactone (ALDACTONE) 25 MG tablet Take 1 tablet (25 mg total) by mouth daily. 30 tablet 6   torsemide (DEMADEX) 20 MG tablet Take 2 tablets (40 mg total) by mouth daily. 120 tablet 6   No current facility-administered medications for this encounter.   No Known Allergies  Social History   Socioeconomic History   Marital status: Single    Spouse name: Not on file   Number of children: Not on file   Years of education: Not  on file   Highest education level: Not on file  Occupational History   Not on file  Tobacco Use   Smoking status: Former    Types: Cigars, Cigarettes   Smokeless tobacco: Never  Vaping Use   Vaping status: Never Used  Substance and Sexual Activity   Alcohol use: Yes    Comment: occ   Drug use: Never   Sexual activity: Not on file  Other Topics Concern   Not on file  Social History Narrative   ** Merged History Encounter **       Social Drivers of Health   Financial Resource Strain: High Risk (03/21/2021)   Overall  Financial Resource Strain (CARDIA)    Difficulty of Paying Living Expenses: Hard  Food Insecurity: Not on file  Transportation Needs: Not on file  Physical Activity: Not on file  Stress: Not on file  Social Connections: Not on file  Intimate Partner Violence: Not on file   No family history on file.  BP 115/81   Ht 6' (1.829 m)   Wt 132.5 kg (292 lb)   SpO2 96%   BMI 39.60 kg/m   Wt Readings from Last 3 Encounters:  03/17/23 132.5 kg (292 lb)  03/10/23 135.9 kg (299 lb 9.6 oz)  03/04/23 130.2 kg (287 lb)   PHYSICAL EXAM: General: Well appearing. No distress on RA Cardiac: JVP ~12cm. S1 and S2 present. No murmurs or rub. Resp: Lung sounds clear and equal B/L Abdomen:Obese, soft, non-distended.  Extremities: Warm and dry. Trace BLE edema.  Neuro: Alert and oriented x3. Affect pleasant. Moves all extremities without difficulty.  ReDs reading: attempted, unable to read.  ECG: ST 111 bpm, QRS 88ms, frequent PVCs (personally reviewed)  ASSESSMENT & PLAN: 1. Chronic Systolic Heart Failure - due to acute myopericarditis/NICM - with frequent PVCs concerned whether causing or caused by reduced EF. Place Zio. - Echo (3/23): EF 25% - R/LHC (3/23): normal cors and well compensated filling pressures. - cMRI (3/23): LVEF 15% with markedly dilated LV, RVEF 33%, with evidence of diffuse myopericarditis. - Echo (12/23): EF 20-25 RV mildly down  - Echo (03/10/23): EF 15%, RV moderately reduced, apical swirl (no thrombus) - Worse NYHA II-III. Volume up but improving, down 8lbs. Unable to obtain ReDs.  - Tachycardia concerning for compensated low output. He reports no s/s. Warm and dry.  - Continue torsemide 40 mg bid + 20 KCL daily. BMET/BNP today - Continue losartan 25 mg daily. (He is unable to provide POI for Entresto application) - Continue spironolactone 25 mg daily. - Continue Jardiance 10 mg daily. No GU symptoms. - Start Digoxin at next visit if volume is improved.  - Not a  candidate for advanced therapies or ICD until he can abstain from substances and demonstrate better compliance. Address at next visit if still abstinent.  - Discussed need to stop ETOH and tobacco completely as he will likely need advanced therapies down the road - If symptoms worsen will need CPX - Likely needs RHC soon  2. ?LV thrombus - Last Echo by Dr. Gala Romney apical swirl no thrombus (on Dr. Shirlee Latch read possible thrombus) - Continue Eliquis 5 mg bid. Patient assistance started  3. PVCs - multiple noted on ECG today.  - place Zio 7 day   4. HTN - stable BP  5. Substance abuse - Encouraged complete cessation from ETOH and tobacco (see above).  - Past UDS + for cocaine and fentanyl; says he doesn't "use" cocaine but "touches" it.  6. CKD IIIa - Baseline SCr 1.2-1.4 - Continue SGLT2i - BMET today  7. Snoring - Concern for OSA - Consider sleep study once he has insurance  8. SDOH - Medicaid app pending - Meds thru HF fund - He has transportation - Engage HFSW for resources - d/w patient significant need for consistent compliance  Follow up in 2 week with APP. He is high risk for hospitalization.  Follow up in 1 month with Dr. Gala Romney.   [place] Avis Mcmahill, NP  1:38 PM

## 2023-03-23 ENCOUNTER — Other Ambulatory Visit: Payer: Self-pay

## 2023-03-23 ENCOUNTER — Other Ambulatory Visit (HOSPITAL_COMMUNITY): Payer: Self-pay

## 2023-03-24 NOTE — Telephone Encounter (Signed)
 Patient was approved to receive Eliquis from [employer] Effective 03/23/2023 to 03/21/2024

## 2023-03-26 ENCOUNTER — Other Ambulatory Visit: Payer: Self-pay

## 2023-03-29 NOTE — Addendum Note (Signed)
 Encounter addended by: Howell Rucks, RDCS on: 03/29/2023 7:39 AM  Actions taken: Imaging Exam ended

## 2023-04-05 ENCOUNTER — Encounter (HOSPITAL_COMMUNITY): Payer: Self-pay

## 2023-04-08 ENCOUNTER — Other Ambulatory Visit (HOSPITAL_COMMUNITY): Payer: Self-pay

## 2023-04-12 NOTE — Telephone Encounter (Signed)
 Separate application approved in alternate encounter

## 2023-04-13 ENCOUNTER — Ambulatory Visit (HOSPITAL_COMMUNITY)
Admission: RE | Admit: 2023-04-13 | Discharge: 2023-04-13 | Disposition: A | Discharge status: Home / Self Care | Payer: Self-pay | Source: Ambulatory Visit | Attending: Internal Medicine | Admitting: Internal Medicine

## 2023-04-13 ENCOUNTER — Other Ambulatory Visit (HOSPITAL_COMMUNITY): Payer: Self-pay

## 2023-04-13 ENCOUNTER — Encounter (HOSPITAL_COMMUNITY): Payer: Self-pay | Admitting: Internal Medicine

## 2023-04-13 VITALS — BP 100/70 | HR 104 | Wt 292.6 lb

## 2023-04-13 DIAGNOSIS — Z86718 Personal history of other venous thrombosis and embolism: Secondary | ICD-10-CM | POA: Insufficient documentation

## 2023-04-13 DIAGNOSIS — R0683 Snoring: Secondary | ICD-10-CM | POA: Diagnosis not present

## 2023-04-13 DIAGNOSIS — I5022 Chronic systolic (congestive) heart failure: Secondary | ICD-10-CM | POA: Insufficient documentation

## 2023-04-13 DIAGNOSIS — I130 Hypertensive heart and chronic kidney disease with heart failure and stage 1 through stage 4 chronic kidney disease, or unspecified chronic kidney disease: Secondary | ICD-10-CM | POA: Diagnosis not present

## 2023-04-13 DIAGNOSIS — I509 Heart failure, unspecified: Secondary | ICD-10-CM | POA: Diagnosis not present

## 2023-04-13 DIAGNOSIS — I10 Essential (primary) hypertension: Secondary | ICD-10-CM

## 2023-04-13 DIAGNOSIS — Z79899 Other long term (current) drug therapy: Secondary | ICD-10-CM | POA: Diagnosis not present

## 2023-04-13 DIAGNOSIS — N1831 Chronic kidney disease, stage 3a: Secondary | ICD-10-CM | POA: Insufficient documentation

## 2023-04-13 DIAGNOSIS — I428 Other cardiomyopathies: Secondary | ICD-10-CM | POA: Diagnosis not present

## 2023-04-13 DIAGNOSIS — Z7901 Long term (current) use of anticoagulants: Secondary | ICD-10-CM | POA: Insufficient documentation

## 2023-04-13 DIAGNOSIS — Z87891 Personal history of nicotine dependence: Secondary | ICD-10-CM | POA: Insufficient documentation

## 2023-04-13 DIAGNOSIS — I513 Intracardiac thrombosis, not elsewhere classified: Secondary | ICD-10-CM

## 2023-04-13 DIAGNOSIS — Z7984 Long term (current) use of oral hypoglycemic drugs: Secondary | ICD-10-CM | POA: Diagnosis not present

## 2023-04-13 DIAGNOSIS — F1990 Other psychoactive substance use, unspecified, uncomplicated: Secondary | ICD-10-CM

## 2023-04-13 LAB — BASIC METABOLIC PANEL WITH GFR
Anion gap: 12 (ref 5–15)
BUN: 11 mg/dL (ref 6–20)
CO2: 22 mmol/L (ref 22–32)
Calcium: 9 mg/dL (ref 8.9–10.3)
Chloride: 105 mmol/L (ref 98–111)
Creatinine, Ser: 1.78 mg/dL — ABNORMAL HIGH (ref 0.61–1.24)
GFR, Estimated: 49 mL/min — ABNORMAL LOW (ref >60)
Glucose, Bld: 93 mg/dL (ref 70–99)
Potassium: 3.9 mmol/L (ref 3.5–5.1)
Sodium: 139 mmol/L (ref 135–145)

## 2023-04-13 LAB — BRAIN NATRIURETIC PEPTIDE: B Natriuretic Peptide: 445.4 pg/mL — ABNORMAL HIGH (ref 0.0–100.0)

## 2023-04-13 MED ORDER — DIGOXIN 125 MCG PO TABS
0.1250 mg | ORAL_TABLET | Freq: Every day | ORAL | 3 refills | Status: DC
  Filled 2023-04-13 – 2023-04-23 (×2): qty 30, 30d supply, fill #0

## 2023-04-13 MED ORDER — TORSEMIDE 20 MG PO TABS
40.0000 mg | ORAL_TABLET | Freq: Two times a day (BID) | ORAL | 6 refills | Status: DC
  Filled 2023-04-13 – 2023-04-23 (×2): qty 120, 30d supply, fill #0

## 2023-04-13 NOTE — Patient Instructions (Signed)
 Great to see you today!!!  Medication Changes:  START Digoxin 0.125 mg Daily  INCREASE Torsemide to 40 mg (2 tabs) Twice daily   Lab Work:  Labs done today, your results will be available in MyChart, we will contact you for abnormal readings.  Special Instructions // Education:  Do the following things EVERYDAY: Weigh yourself in the morning before breakfast. Write it down and keep it in a log. Take your medicines as prescribed Eat low salt foods--Limit salt (sodium) to 2000 mg per day.  Stay as active as you can everyday Limit all fluids for the day to less than 2 liters   Follow-Up in: 1 month   At the [facility], you and your health needs are our priority. We have a designated team specialized in the treatment of Heart Failure. This Care Team includes your primary Heart Failure Specialized Cardiologist (physician), Advanced Practice Providers (APPs- Physician Assistants and Nurse Practitioners), and Pharmacist who all work together to provide you with the care you need, when you need it.   You may see any of the following providers on your designated Care Team at your next follow up:  Dr. Arvilla Meres Dr. Marca Ancona Dr. Dorthula Nettles Dr. Theresia Bough Tonye Becket, NP Robbie Lis, [place] [facility], [place] Brynda Peon, NP [place] Lee, NP Karle Plumber, PharmD   Please be sure to bring in all your medications bottles to every appointment.   Need to Contact us:  If you have any questions or concerns before your next appointment please send [place] a message through [place] or call our office at [phone].    TO LEAVE A MESSAGE FOR THE NURSE SELECT OPTION 2, PLEASE LEAVE A MESSAGE INCLUDING: YOUR NAME DATE OF BIRTH CALL BACK NUMBER REASON FOR CALL**this is important as we prioritize the call backs  YOU WILL RECEIVE A CALL BACK THE SAME DAY AS LONG AS YOU CALL BEFORE 4:00 PM

## 2023-04-13 NOTE — Progress Notes (Signed)
 ReDS Vest / Clip - 04/13/23 1100       ReDS Vest / Clip   Station Marker D    Ruler Value 39    ReDS Value Range High volume overload    ReDS Actual Value 50

## 2023-04-13 NOTE — Progress Notes (Signed)
 ADVANCED HF CLINIC NOTE  Primary Care: none HF Cardiologist: Dr. Gala Romney  HPI: [name] is a 38 y.o. 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 Echo EF 20-25%. R/LHC with normal cors, well-compensated filling pressures, EF 25%. cMRI with LVEF 15%, RVEF 33%, findings consistent with myopericarditis. Viral panel negative. Started on GDMT and colchicine. Discharged home, weight 282 lbs.  No-showed for hospital follow up.  Seen in ED 8/23 with a/c CHF. Out of Entresto for a few months. Diuresed with IV lasix. Echo showed  EF 20-25%, moderate LVH, RV mildly enlarged., LA moderately dilated. AHF consulted, Entresto restarted and Lasix 20 mg daily continued.   Echo 12/23 EF 20-25%  He was seen in [facility] ED in September with shortness of breath and chest pressure after he had stopped taking his HF medications for a week.  Troponin negative X 2. ProBNP 5500. CTA chest with no PE but did show pulmonary edema. UDS + for cocaine and fentanyl. He diuresed with IV lasix and felt to be stable for discharge home.  Echo 2/26/25EF < 20%, ? LV thrombus.  Seen last month in NP clinic was volume overloaded and concern for low output. ReDS 52% Stopped carvedilol. Switched lasix to torsemide 40 bid (but he is only taking 20 bid). Says he feels better. Not very active but able to take care of his 6 kids. Weight up and down. Says no longer gets SOB with steps. Down to 1 cig/day. Drinks 2x/week but says its a good bit.   ReDS 50%  Cardiac Studies  - Echo (8/23): EF 20-25%, moderate LVH, RV low/normal.  - cMRI (3/23): EF 15% with markedly dilated LV with evidence of diffuse myopericarditis. RV 33%   - R/LHC (3/23): normal cors EF 25%  RA = 3 RV = 32/10 PA = 34/20 (26) PCW = 17 Fick cardiac output/index = 6.1/2.5 PVR = 1.5 WU Ao sat = 94% PA sat = 68%, 68%  Past Medical History:  Diagnosis Date   AKI (acute kidney injury) (HCC)     CHF (congestive heart failure) (HCC)    Gout    Hypertension    Current Outpatient Medications  Medication Sig Dispense Refill   apixaban (ELIQUIS) 5 MG TABS tablet Take 1 tablet (5 mg total) by mouth 2 (two) times daily. 60 tablet 6   empagliflozin (JARDIANCE) 10 MG TABS tablet Take 1 tablet (10 mg total) by mouth daily before breakfast. 90 tablet 3   losartan (COZAAR) 25 MG tablet Take 1 tablet (25 mg total) by mouth daily. 30 tablet 6   potassium chloride SA (KLOR-CON M) 20 MEQ tablet Take 2 tablets (40 mEq total) by mouth 2 (two) times daily. 60 tablet 6   spironolactone (ALDACTONE) 25 MG tablet Take 1 tablet (25 mg total) by mouth daily. 30 tablet 6   torsemide (DEMADEX) 20 MG tablet Take 2 tablets (40 mg total) by mouth daily. 120 tablet 6   No current facility-administered medications for this encounter.   No Known Allergies  Social History   Socioeconomic History   Marital status: Single    Spouse name: Not on file   Number of children: Not on file   Years of education: Not on file   Highest education level: Not on file  Occupational History   Not on file  Tobacco Use   Smoking status: Former    Types: Cigars, Cigarettes   Smokeless tobacco:  Never  Vaping Use   Vaping status: Never Used  Substance and Sexual Activity   Alcohol use: Yes    Comment: occ   Drug use: Never   Sexual activity: Not on file  Other Topics Concern   Not on file  Social History Narrative   ** Merged History Encounter **       Social Drivers of Health   Financial Resource Strain: High Risk (03/21/2021)   Overall Financial Resource Strain (CARDIA)    Difficulty of Paying Living Expenses: Hard  Food Insecurity: Not on file  Transportation Needs: Not on file  Physical Activity: Not on file  Stress: Not on file  Social Connections: Not on file  Intimate Partner Violence: Not on file   History reviewed. No pertinent family history.  BP 100/70   Pulse (!) 104   Wt 132.7 kg (292 lb  9.6 oz)   SpO2 97%   BMI 39.68 kg/m   Wt Readings from Last 3 Encounters:  04/13/23 132.7 kg (292 lb 9.6 oz)  03/17/23 132.5 kg (292 lb)  03/10/23 135.9 kg (299 lb 9.6 oz)   PHYSICAL EXAM: General: Sitting on exam table No resp difficulty HEENT: normal Neck: supple. JVP 7-8 Carotids 2+ bilat; no bruits. No lymphadenopathy or thryomegaly appreciated. Cor: Regular rate & rhythm. +s3 Lungs: clear Abdomen: obese soft, nontender, nondistended. No bruits or masses. Good bowel sounds. Extremities: no cyanosis, clubbing, rash, edema Neuro: alert & orientedx3, cranial nerves grossly intact. moves all 4 extremities w/o difficulty. Affect pleasant   ReDs reading: 50% high  ECG (personally reviewed): N/A  ASSESSMENT & PLAN:  1. 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 - Viral panel negative - cMRI (3/23): LVEF 15% with markedly dilated LV, RVEF 33%, with evidence of diffuse myopericarditis. - Echo (8/23) EF 20-25%, moderate LVH, RV low/normal. - Echo (12/23/21): EF 20-25 RV mildly down  - Echo 03/10/23: EF<20%, RV moderately reduced, ? thrombus - Symptomatically improved from last visit but still NYHA III - Volume still elevated ReDS 50% - Increase torsemide to 40 mg bid. Continue KCL - Continue losartan 25 mg daily. (He is unable to provide POI for Entresto application) - Continue spironolactone 25 mg daily. - Continue Jardiance 10 mg daily. No GU symptoms. - Add digoxin 0.125 - B-blocker stopped 2/25 due to low output - Not a candidate for advanced therapies or ICD until he can abstain from substances and demonstrate better compliance - We again discussed need to stop ETOH and tobacco completely as he will likely need advanced therapies down the road - Consider CPX testing when available - Labs today  2. LV thrombus - Recent echo appears to have possible LV  clot - Continue Eliquis 5 mg bid - No bleeding  3. Substance abuse - Again encouraged complete cessation from ETOH and tobacco (see above) - he is aware that he is not a candidate for ICD or advanced therapies until these i.  - Past UDS + for cocaine and fentanyl; says he doesn't "use" cocaine but "touches" it.    5. CKD II/IIIa - Baseline SCr 1.2-1.4 - Continue SGLT2i - Labs today  6. Snoring - Concern for OSA - Consider sleep study once he has insurance  7. SDOH - Medicaid app pending - Meds thru HF fund - He has transportation - Engage HFSW for resources  Arvilla Meres, MD  10:55 AM

## 2023-04-16 ENCOUNTER — Encounter (HOSPITAL_COMMUNITY): Payer: Self-pay | Admitting: Internal Medicine

## 2023-04-23 ENCOUNTER — Other Ambulatory Visit (HOSPITAL_COMMUNITY): Payer: Self-pay

## 2023-04-26 ENCOUNTER — Other Ambulatory Visit (HOSPITAL_COMMUNITY): Payer: Self-pay

## 2023-05-03 NOTE — Progress Notes (Addendum)
 ADVANCED HF CLINIC NOTE  Primary Care: none HF Cardiologist: Dr. Julane Ny  HPI: Robert Daniels is a 38 y.o. 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 Echo EF 20-25%. R/LHC with normal cors, well-compensated filling pressures, EF 25%. cMRI with LVEF 15%, RVEF 33%, findings consistent with myopericarditis. Viral panel negative. Started on GDMT and colchicine . Discharged home, weight 282 lbs.  No-showed for hospital follow up.  Seen in ED 8/23 with a/c CHF. Out of Entresto  for a few months. Diuresed with IV lasix . Echo showed  EF 20-25%, moderate LVH, RV mildly enlarged., LA moderately dilated. AHF consulted, Entresto  restarted and Lasix  20 mg daily continued.   Echo 12/23 EF 20-25%  He was seen in [facility] ED in September with shortness of breath and chest pressure after he had stopped taking his HF medications for a week.  Troponin negative X 2. ProBNP 5500. CTA chest with no PE but did show pulmonary edema. UDS + for cocaine and fentanyl . He diuresed with IV lasix  and felt to be stable for discharge home.  Echo 03/10/23 EF < 20%, ? LV thrombus.  Today she returns for AHF follow up. Overall feeling pretty bad. Denies palpitations, CP, dizziness, edema, or PND/Orthopnea. Has been significantly SOB for the last 2-3 days. Reports 7 lb weight gain since Monday. Appetite not good currently. No fever or chills. Weight at home 285-288 pounds. Taking all medications. Stopped smoking 1 month ago. Used to drink every other day up until last week (liquor).  Denies other drug use.   Cardiac Studies - Echo (8/23): EF 20-25%, moderate LVH, RV low/normal. - cMRI (3/23): EF 15% with markedly dilated LV with evidence of diffuse myopericarditis. RV 33% - R/LHC (3/23): normal cors EF 25% RA = 3. RV = 32/10. PA = 34/20 (26). PCW = 17 Fick cardiac output/index = 6.1/2.5. PVR = 1.5 WU. Ao sat = 94%. PA sat = 68%, 68%  Past Medical History:   Diagnosis Date   AKI (acute kidney injury) (HCC)    CHF (congestive heart failure) (HCC)    Gout    Hypertension    Current Outpatient Medications  Medication Sig Dispense Refill   apixaban  (ELIQUIS ) 5 MG TABS tablet Take 1 tablet (5 mg total) by mouth 2 (two) times daily. 60 tablet 6   digoxin  (LANOXIN ) 0.125 MG tablet Take 1 tablet (0.125 mg total) by mouth daily. 30 tablet 3   empagliflozin  (JARDIANCE ) 10 MG TABS tablet Take 1 tablet (10 mg total) by mouth daily before breakfast. 90 tablet 3   losartan  (COZAAR ) 25 MG tablet Take 1 tablet (25 mg total) by mouth daily. 30 tablet 6   potassium chloride  SA (KLOR-CON  M) 20 MEQ tablet Take 2 tablets (40 mEq total) by mouth 2 (two) times daily. 60 tablet 6   spironolactone  (ALDACTONE ) 25 MG tablet Take 1 tablet (25 mg total) by mouth daily. 30 tablet 6   torsemide  (DEMADEX ) 20 MG tablet Take 2 tablets (40 mg total) by mouth 2 (two) times daily. 120 tablet 6   No current facility-administered medications for this encounter.   No Known Allergies  Social History   Socioeconomic History   Marital status: Single    Spouse name: Not on file   Number of children: Not on file   Years of education: Not on file   Highest education level: Not on file  Occupational History   Not on file  Tobacco  Use   Smoking status: Former    Types: Cigars, Cigarettes   Smokeless tobacco: Never  Vaping Use   Vaping status: Never Used  Substance and Sexual Activity   Alcohol use: Yes    Comment: occ   Drug use: Never   Sexual activity: Not on file  Other Topics Concern   Not on file  Social History Narrative   ** Merged History Encounter **       Social Drivers of Health   Financial Resource Strain: High Risk (03/21/2021)   Overall Financial Resource Strain (CARDIA)    Difficulty of Paying Living Expenses: Hard  Food Insecurity: Not on file  Transportation Needs: Not on file  Physical Activity: Not on file  Stress: Not on file  Social  Connections: Not on file  Intimate Partner Violence: Not on file   No family history on file.  BP (!) 112/92   Pulse (!) 112   Ht 6' (1.829 m)   Wt 134 kg (295 lb 6.4 oz)   SpO2 95%   BMI 40.06 kg/m   Wt Readings from Last 3 Encounters:  05/13/23 134 kg (295 lb 6.4 oz)  04/13/23 132.7 kg (292 lb 9.6 oz)  03/17/23 132.5 kg (292 lb)   PHYSICAL EXAM: General:  SOB appearing.  + conversational dyspnea. Walked into clinic Neck: supple. JVD difficult to see, at least 14 cm.  Cor: PMI nondisplaced. Tachy rate & regular rhythm. No rubs, gallops or murmurs. Lungs: clear, diminished bases Abdomen: soft, nontender, distended.  Extremities: no cyanosis, clubbing, rash, non-pitting BLE edema  Neuro: alert & oriented x 3. Moves all 4 extremities w/o difficulty. Affect pleasant.   ReDs reading: 55 %, abnormal  ECG (personally reviewed): ST with PVCs 111 bpm  ASSESSMENT & PLAN:  1. Acute on chronic Systolic Heart Failure - due to acute myopericarditis/NICM  - Echo (3/23): EF 25% - R/LHC (3/23): normal cors and well compensated filling pressures. - cMRI (3/23): LVEF 15% with markedly dilated LV, RVEF 33%, with evidence of diffuse myopericarditis. Viral panel (-) - Echo (8/23) EF 20-25%, moderate LVH, RV low/normal. - Echo (12/23): EF 20-25 RV mildly down  - Echo 2/25: EF<20%, RV moderately reduced, ? thrombus - NYHA IV today. Volume overloaded and symptomatic. ReDS clip 55%.  - At this time he cannot be admitted as he has things to do with his son tomorrow. Considered Aquapass however he is NYHA IV and does not qualify. Will do 3 days of Furoscix  + 40mEq KDUR. Additional 5 mg metolazone  today with additional 40 mEq KDUR (80 mEq KDUR total today). Plan discussed with Dr. Alease Amend.  - After 3 days he can resume his Torsemide  at 40 mg BID (was only taking 20 mg BID) +40 mEq KDUR daily (only taking 20 mEq daily) - Continue losartan  25 mg daily. (He is unable to provide POI for Entresto   application) - Continue spironolactone  25 mg daily. - Continue Jardiance  10 mg daily. No GU symptoms. - Continue digoxin  0.125. Check digoxin  level today - B-blocker stopped 2/25 due to low output - Not a candidate for advanced therapies or ICD until he can abstain from substances and demonstrate better compliance - We again discussed need to stop ETOH completely as he will likely need advanced therapies down the road - Quit smoking 1 month ago - Consider CPX testing when available - Labs today  2. LV thrombus - Recent echo appears to have possible LV clot - Continue Eliquis  5 mg bid. CBC today.  Reports compliance.  - No bleeding  3. Substance abuse - Again encouraged complete cessation from ETOH and tobacco (see above) - he is aware that he is not a candidate for ICD or advanced therapies until these i.  - Past UDS + for cocaine and fentanyl ; says he doesn't "use" cocaine but "touches" it.    5. CKD II/IIIa - Baseline SCr 1.2-1.4 - Continue SGLT2i - Labs today  6. Snoring - Concern for OSA - Consider sleep study at follow up  7. SDOH - Now has medicaid - He has transportation - Engage HFSW for resources  Follow up next week to reassess volume. (Will need ReDs and EKG).   FUROSCIX  prescribed  Patient viewed patient education video with QR code for FUROSCIX    QR code for FUROSCIX  placed on AVS  Call FUROSCIX  Direct at [phone] for questions regarding on body infuser.  Day 1  FUROSCIX  80 mg once daily  via on body infuser + KDUR 40mEq  Day 2  FUROSCIX  80 mg once daily  via on body infuser+ KDUR 40mEq  Day 3 FUROSCIX   80 mg once daily  via on body infuser+ KDUR 40mEq   Sheryl Donna, NP  11:16 AM

## 2023-05-12 ENCOUNTER — Telehealth (HOSPITAL_COMMUNITY): Payer: Self-pay | Admitting: *Deleted

## 2023-05-12 NOTE — Telephone Encounter (Signed)
 Called to confirm/remind patient of their appointment at the Advanced [facility] Failure Clinic on 04/16/23.    Appointment:              [] Confirmed             [x] Left mess              [] No answer/No voice mail             [] Phone not in service   Patient reminded to bring all medications and/or complete list.   Confirmed patient has transportation. Gave directions, instructed to utilize valet parking.

## 2023-05-13 ENCOUNTER — Encounter (HOSPITAL_COMMUNITY): Payer: Self-pay

## 2023-05-13 ENCOUNTER — Ambulatory Visit (HOSPITAL_COMMUNITY)
Admission: RE | Admit: 2023-05-13 | Discharge: 2023-05-13 | Disposition: A | Discharge status: Home / Self Care | Source: Ambulatory Visit | Attending: Internal Medicine | Admitting: Internal Medicine

## 2023-05-13 VITALS — BP 112/92 | HR 112 | Ht 72.0 in | Wt 295.4 lb

## 2023-05-13 DIAGNOSIS — I309 Acute pericarditis, unspecified: Secondary | ICD-10-CM | POA: Diagnosis not present

## 2023-05-13 DIAGNOSIS — I5022 Chronic systolic (congestive) heart failure: Secondary | ICD-10-CM | POA: Diagnosis not present

## 2023-05-13 DIAGNOSIS — I428 Other cardiomyopathies: Secondary | ICD-10-CM | POA: Diagnosis not present

## 2023-05-13 DIAGNOSIS — Z86718 Personal history of other venous thrombosis and embolism: Secondary | ICD-10-CM | POA: Diagnosis not present

## 2023-05-13 DIAGNOSIS — E669 Obesity, unspecified: Secondary | ICD-10-CM | POA: Insufficient documentation

## 2023-05-13 DIAGNOSIS — Z79899 Other long term (current) drug therapy: Secondary | ICD-10-CM | POA: Insufficient documentation

## 2023-05-13 DIAGNOSIS — I130 Hypertensive heart and chronic kidney disease with heart failure and stage 1 through stage 4 chronic kidney disease, or unspecified chronic kidney disease: Secondary | ICD-10-CM | POA: Diagnosis not present

## 2023-05-13 DIAGNOSIS — Z7901 Long term (current) use of anticoagulants: Secondary | ICD-10-CM | POA: Diagnosis not present

## 2023-05-13 DIAGNOSIS — I513 Intracardiac thrombosis, not elsewhere classified: Secondary | ICD-10-CM

## 2023-05-13 DIAGNOSIS — Z6841 Body Mass Index (BMI) 40.0 and over, adult: Secondary | ICD-10-CM | POA: Insufficient documentation

## 2023-05-13 DIAGNOSIS — I5023 Acute on chronic systolic (congestive) heart failure: Secondary | ICD-10-CM

## 2023-05-13 DIAGNOSIS — R0683 Snoring: Secondary | ICD-10-CM | POA: Insufficient documentation

## 2023-05-13 DIAGNOSIS — I319 Disease of pericardium, unspecified: Secondary | ICD-10-CM | POA: Diagnosis not present

## 2023-05-13 DIAGNOSIS — Z7984 Long term (current) use of oral hypoglycemic drugs: Secondary | ICD-10-CM | POA: Insufficient documentation

## 2023-05-13 DIAGNOSIS — Z87891 Personal history of nicotine dependence: Secondary | ICD-10-CM | POA: Diagnosis not present

## 2023-05-13 DIAGNOSIS — F1990 Other psychoactive substance use, unspecified, uncomplicated: Secondary | ICD-10-CM

## 2023-05-13 DIAGNOSIS — N1831 Chronic kidney disease, stage 3a: Secondary | ICD-10-CM | POA: Diagnosis not present

## 2023-05-13 LAB — BASIC METABOLIC PANEL WITH GFR
Anion gap: 11 (ref 5–15)
BUN: 15 mg/dL (ref 6–20)
CO2: 23 mmol/L (ref 22–32)
Calcium: 9.2 mg/dL (ref 8.9–10.3)
Chloride: 105 mmol/L (ref 98–111)
Creatinine, Ser: 1.63 mg/dL — ABNORMAL HIGH (ref 0.61–1.24)
GFR, Estimated: 55 mL/min — ABNORMAL LOW (ref >60)
Glucose, Bld: 127 mg/dL — ABNORMAL HIGH (ref 70–99)
Potassium: 3.7 mmol/L (ref 3.5–5.1)
Sodium: 139 mmol/L (ref 135–145)

## 2023-05-13 LAB — CBC
HCT: 35.2 % — ABNORMAL LOW (ref 39.0–52.0)
Hemoglobin: 12 g/dL — ABNORMAL LOW (ref 13.0–17.0)
MCH: 30.6 pg (ref 26.0–34.0)
MCHC: 34.1 g/dL (ref 30.0–36.0)
MCV: 89.8 fL (ref 80.0–100.0)
Platelets: 241 10*3/uL (ref 150–400)
RBC: 3.92 MIL/uL — ABNORMAL LOW (ref 4.22–5.81)
RDW: 14.4 % (ref 11.5–15.5)
WBC: 11.7 10*3/uL — ABNORMAL HIGH (ref 4.0–10.5)
nRBC: 0 % (ref 0.0–0.2)

## 2023-05-13 LAB — DIGOXIN LEVEL: Digoxin Level: <0.4 ng/mL — ABNORMAL LOW (ref 0.8–2.0)

## 2023-05-13 LAB — BRAIN NATRIURETIC PEPTIDE: B Natriuretic Peptide: 1632.8 pg/mL — ABNORMAL HIGH (ref 0.0–100.0)

## 2023-05-13 MED ORDER — DIGOXIN 125 MCG PO TABS
0.1250 mg | ORAL_TABLET | Freq: Every day | ORAL | 3 refills | Status: DC
Start: 2023-05-13 — End: 2023-05-18

## 2023-05-13 MED ORDER — METOLAZONE 5 MG PO TABS: 5.0000 mg | ORAL_TABLET | Freq: Every day | ORAL | 0 refills | Status: DC

## 2023-05-13 MED ORDER — SPIRONOLACTONE 25 MG PO TABS: 25.0000 mg | ORAL_TABLET | Freq: Every day | ORAL | 6 refills | Status: DC

## 2023-05-13 MED ORDER — LOSARTAN POTASSIUM 25 MG PO TABS
25.0000 mg | ORAL_TABLET | Freq: Every day | ORAL | 6 refills | Status: DC
Start: 2023-05-13 — End: 2023-05-18

## 2023-05-13 MED ORDER — POTASSIUM CHLORIDE CRYS ER 20 MEQ PO TBCR: 40.0000 meq | EXTENDED_RELEASE_TABLET | Freq: Every day | ORAL | 3 refills | Status: DC

## 2023-05-13 MED ORDER — EMPAGLIFLOZIN 10 MG PO TABS: 10.0000 mg | ORAL_TABLET | Freq: Every day | ORAL | 3 refills | Status: DC

## 2023-05-13 MED ORDER — FUROSCIX 80 MG/10ML ~~LOC~~ CTKT: 80.0000 mg | CARTRIDGE | SUBCUTANEOUS | Status: DC

## 2023-05-13 MED ORDER — APIXABAN 5 MG PO TABS: 5.0000 mg | ORAL_TABLET | Freq: Two times a day (BID) | ORAL | 3 refills | Status: DC

## 2023-05-13 NOTE — Progress Notes (Signed)
 Provided patient education on Furoscix  using demo kits and Furoscix  video, QR code provided on AVS for further viewing. Furoscix  order submitted online, ov note and ins card uploaded to Furoscix  Direct.   Medication Samples have been provided to the patient.  Drug name: FUROSCIX        Strength: 80MG         Qty: 2 BOXES  LOT: [id number]  Exp.Date: 05/11/24  Dosing instructions: AS DIRECTED   The patient has been instructed regarding the correct time, dose, and frequency of taking this medication, including desired effects and most common side effects.   Cristen Bredeson B Samyria Rudie 11:27 AM 05/13/2023

## 2023-05-13 NOTE — Progress Notes (Signed)
 ReDS Vest / Clip - 05/13/23 1056       ReDS Vest / Clip   Station Marker D    Ruler Value 39    ReDS Value Range High volume overload    ReDS Actual Value 55

## 2023-05-13 NOTE — Patient Instructions (Signed)
 Medication Changes:  TAKE METOLAZONE  5MG  TODAY ONLY FOR ONE DOSE  DO NOT TAKE TORSEMIDE  THE NEXT 3 DAYS- YOU MAY RESUME 40MG  TWICE DAILY ON SUNDAY WITH 40MEQ OF POTASSIUM DAILY   Your provider has order Furoscix  for you. This is an on-body infuser that gives you a dose of Furosemide .   It will be shipped to your home   [employer]uro[employer]  Direct will call you to discuss before shipping so, PLEASE answer unknown calls  For questions regarding the device call Furoscix  Direct at [phone]  Ensure you write down the time you start your infusion so that if there is a problem you will know how long the infusion lasted  Use Furoscix  only AS DIRECTED by our office  Dosing Directions:   Day 1= TODAY- 1 DOSE WITH OF POTASSIUM  (4) TABLETS   Day 2= TOMORROW 5/2 TAKE 1 DOSE WITH OF POTASSIUM   Day 3= SATURDAY 5/3 TAKE 1 DOSE WITH OF POTASSIUM   DAY 4- RESUME TORSEMIDE  40MG  TWICE DAILY WITH OF POTASSIUM   Lab Work:  Labs done today, your results will be available in MyChart, we will contact you for abnormal readings.  Follow-Up in: AS SCHEDULED NEXT WEEK   At the [facility], you and your health needs are our priority. We have a designated team specialized in the treatment of Heart Failure. This Care Team includes your primary Heart Failure Specialized Cardiologist (physician), Advanced Practice Providers (APPs- Physician Assistants and Nurse Practitioners), and Pharmacist who all work together to provide you with the care you need, when you need it.   You may see any of the following providers on your designated Care Team at your next follow up:  Dr. Jules Oar Dr. Peder Bourdon Dr. Alwin Baars Dr. Judyth Nunnery Nieves Bars, NP Ruddy Corral, [place] [facility] [place], [place] Dennise Fitz, NP [place] Lee, NP Luster Salters, PharmD   Please be sure to bring in all your medications bottles to every appointment.   Need to Contact  Us :  If you have any questions or concerns before your next appointment please send us  a message through [place] or call our office at [phone].    TO LEAVE A MESSAGE FOR THE NURSE SELECT OPTION 2, PLEASE LEAVE A MESSAGE INCLUDING: YOUR NAME DATE OF BIRTH CALL BACK NUMBER REASON FOR CALL**this is important as we prioritize the call backs  YOU WILL RECEIVE A CALL BACK THE SAME DAY AS LONG AS YOU CALL BEFORE 4:00 PM

## 2023-05-14 NOTE — Progress Notes (Signed)
 ADVANCED HF CLINIC NOTE  Primary Care: none HF Cardiologist: Dr. Julane Ny  HPI: [name] is a 38 y.o. with obesity, tobacco abuse and systolic heart failure due to NICM.   Admitted 3/23 with CP and + Hs-Troponin. Echo EF 20-25%. R/LHC with normal cors, well-compensated filling pressures, EF 25%. cMRI with LVEF 15%, RVEF 33%, findings consistent with myopericarditis. Viral panel negative. Started on GDMT and colchicine . Discharged home, weight 282 lbs.  No-showed for hospital follow up.  Seen in ED 8/23 with a/c CHF. Out of Entresto  for a few months. Diuresed with IV lasix . Echo showed  EF 20-25%, moderate LVH, RV mildly enlarged., LA moderately dilated. AHF consulted, Entresto  restarted and Lasix  20 mg daily continued.   Echo 12/23 EF 20-25%  He was seen in [facility] ED in September with shortness of breath and chest pressure after he had stopped taking his HF medications for a week.  Troponin negative X 2. ProBNP 5500. CTA chest with no PE but did show pulmonary edema. UDS + for cocaine and fentanyl . He diuresed with IV lasix  and felt to be stable for discharge home.  Echo 03/10/23 EF < 20%, ? LV thrombus.  Today she returns for AHF follow up. Overall feeling pretty bad. Denies palpitations, CP, dizziness, edema, or PND/Orthopnea. Has been significantly SOB for the last 2-3 days. Reports 7 lb weight gain since Monday. Appetite not good currently. No fever or chills. Weight at home 285-288 pounds. Taking all medications. Stopped smoking 1 month ago. Used to drink every other day up until last week (liquor).  Denies other drug use.   Cardiac Studies - Echo (8/23): EF 20-25%, moderate LVH, RV low/normal. - cMRI (3/23): EF 15% with markedly dilated LV with evidence of diffuse myopericarditis. RV 33% - R/LHC (3/23): normal cors EF 25% RA = 3. RV = 32/10. PA = 34/20 (26). PCW = 17 Fick cardiac output/index = 6.1/2.5. PVR = 1.5 WU. Ao sat = 94%. PA sat = 68%, 68%  Past Medical History:   Diagnosis Date   AKI (acute kidney injury) (HCC)    CHF (congestive heart failure) (HCC)    Gout    Hypertension    Current Outpatient Medications  Medication Sig Dispense Refill   apixaban  (ELIQUIS ) 5 MG TABS tablet Take 1 tablet (5 mg total) by mouth 2 (two) times daily. 180 tablet 3   digoxin  (LANOXIN ) 0.125 MG tablet Take 1 tablet (0.125 mg total) by mouth daily. 30 tablet 3   empagliflozin  (JARDIANCE ) 10 MG TABS tablet Take 1 tablet (10 mg total) by mouth daily before breakfast. 90 tablet 3   Furosemide  (FUROSCIX ) 80 MG/10ML CTKT Inject 80 mg into the skin as directed.     losartan  (COZAAR ) 25 MG tablet Take 1 tablet (25 mg total) by mouth daily. 30 tablet 6   metolazone  (ZAROXOLYN ) 5 MG tablet Take 1 tablet (5 mg total) by mouth daily. TODAY ONLY 1 tablet 0   potassium chloride  SA (KLOR-CON  M) 20 MEQ tablet Take 2 tablets (40 mEq total) by mouth daily. 180 tablet 3   spironolactone  (ALDACTONE ) 25 MG tablet Take 1 tablet (25 mg total) by mouth daily. 30 tablet 6   torsemide  (DEMADEX ) 20 MG tablet Take 2 tablets (40 mg total) by mouth 2 (two) times daily. 120 tablet 6   No current facility-administered medications for this visit.   No Known Allergies  Social History   Socioeconomic History   Marital status: Single    Spouse name: Not on  file   Number of children: Not on file   Years of education: Not on file   Highest education level: Not on file  Occupational History   Not on file  Tobacco Use   Smoking status: Former    Types: Cigars, Cigarettes   Smokeless tobacco: Never  Vaping Use   Vaping status: Never Used  Substance and Sexual Activity   Alcohol use: Yes    Comment: occ   Drug use: Never   Sexual activity: Not on file  Other Topics Concern   Not on file  Social History Narrative   ** Merged History Encounter **       Social Drivers of Health   Financial Resource Strain: High Risk (03/21/2021)   Overall Financial Resource Strain (CARDIA)    Difficulty  of Paying Living Expenses: Hard  Food Insecurity: Not on file  Transportation Needs: Not on file  Physical Activity: Not on file  Stress: Not on file  Social Connections: Not on file  Intimate Partner Violence: Not on file   No family history on file.  There were no vitals taken for this visit.  Wt Readings from Last 3 Encounters:  05/13/23 134 kg (295 lb 6.4 oz)  04/13/23 132.7 kg (292 lb 9.6 oz)  03/17/23 132.5 kg (292 lb)   PHYSICAL EXAM: General:  SOB appearing.  + conversational dyspnea. Walked into clinic Neck: supple. JVD difficult to see, at least 14 cm.  Cor: PMI nondisplaced. Tachy rate & regular rhythm. No rubs, gallops or murmurs. Lungs: clear, diminished bases Abdomen: soft, nontender, distended.  Extremities: no cyanosis, clubbing, rash, non-pitting BLE edema  Neuro: alert & oriented x 3. Moves all 4 extremities w/o difficulty. Affect pleasant.   ReDs reading: 55 %, abnormal  ECG (personally reviewed): ST with PVCs 111 bpm  ASSESSMENT & PLAN:  1. Acute on chronic Systolic Heart Failure - due to acute myopericarditis/NICM  - Echo (3/23): EF 25% - R/LHC (3/23): normal cors and well compensated filling pressures. - cMRI (3/23): LVEF 15% with markedly dilated LV, RVEF 33%, with evidence of diffuse myopericarditis. Viral panel (-) - Echo (8/23) EF 20-25%, moderate LVH, RV low/normal. - Echo (12/23): EF 20-25 RV mildly down  - Echo 2/25: EF<20%, RV moderately reduced, ? thrombus - NYHA IV today. Volume overloaded and symptomatic. ReDS clip 55%.  - At this time he cannot be admitted as he has things to do with his son tomorrow. Considered Aquapass however he is NYHA IV and does not qualify. Will do 3 days of Furoscix  + 40mEq KDUR. Additional 5 mg metolazone  today with additional 40 mEq KDUR (80 mEq KDUR total today). Plan discussed with Dr. Alease Amend.  - After 3 days he can resume his Torsemide  at 40 mg BID (was only taking 20 mg BID) +40 mEq KDUR daily (only taking 20  mEq daily) - Continue losartan  25 mg daily. (He is unable to provide POI for Entresto  application) - Continue spironolactone  25 mg daily. - Continue Jardiance  10 mg daily. No GU symptoms. - Continue digoxin  0.125. Check digoxin  level today - B-blocker stopped 2/25 due to low output - Not a candidate for advanced therapies or ICD until he can abstain from substances and demonstrate better compliance - We again discussed need to stop ETOH completely as he will likely need advanced therapies down the road - Quit smoking 1 month ago - Consider CPX testing when available - Labs today  2. LV thrombus - Recent echo appears to have possible  LV clot - Continue Eliquis  5 mg bid. CBC today. Reports compliance.  - No bleeding  3. Substance abuse - Again encouraged complete cessation from ETOH and tobacco (see above) - he is aware that he is not a candidate for ICD or advanced therapies until these i.  - Past UDS + for cocaine and fentanyl ; says he doesn't "use" cocaine but "touches" it.    5. CKD II/IIIa - Baseline SCr 1.2-1.4 - Continue SGLT2i - Labs today  6. Snoring - Concern for OSA - Consider sleep study at follow up  7. SDOH - Now has medicaid - He has transportation - Engage HFSW for resources  Follow up next week to reassess volume. (Will need ReDs and EKG).   FUROSCIX  prescribed  Patient viewed patient education video with QR code for FUROSCIX    QR code for FUROSCIX  placed on AVS  Call FUROSCIX  Direct at [phone] for questions regarding on body infuser.  Day 1  FUROSCIX  80 mg once daily  via on body infuser + KDUR 40mEq  Day 2  FUROSCIX  80 mg once daily  via on body infuser+ KDUR 40mEq  Day 3 FUROSCIX   80 mg once daily  via on body infuser+ KDUR 40mEq   Matraca Hunkins M Stanton Kissoon, FNP  1:08 PM

## 2023-05-17 ENCOUNTER — Telehealth (HOSPITAL_COMMUNITY): Payer: Self-pay

## 2023-05-17 NOTE — Telephone Encounter (Signed)
 Called to confirm/remind patient of their appointment at the [facility] on 05/18/2023 9:00.   Appointment:   [x] Confirmed  [] Left mess   [] No answer/No voice mail  [] VM Full/unable to leave message  [] Phone not in service  Patient reminded to bring all medications and/or complete list.  Confirmed patient has transportation. Gave directions, instructed to utilize valet parking.

## 2023-05-18 ENCOUNTER — Other Ambulatory Visit (HOSPITAL_COMMUNITY): Payer: Self-pay

## 2023-05-18 ENCOUNTER — Other Ambulatory Visit: Payer: Self-pay

## 2023-05-18 ENCOUNTER — Telehealth (HOSPITAL_COMMUNITY): Payer: Self-pay | Admitting: Pharmacy Technician

## 2023-05-18 ENCOUNTER — Encounter (HOSPITAL_COMMUNITY): Payer: Self-pay

## 2023-05-18 ENCOUNTER — Ambulatory Visit (HOSPITAL_COMMUNITY)
Admission: RE | Admit: 2023-05-18 | Discharge: 2023-05-18 | Disposition: A | Discharge status: Home / Self Care | Source: Ambulatory Visit | Attending: Family Medicine | Admitting: Family Medicine

## 2023-05-18 VITALS — BP 105/83 | HR 91 | Ht 72.0 in | Wt 286.4 lb

## 2023-05-18 DIAGNOSIS — Z7901 Long term (current) use of anticoagulants: Secondary | ICD-10-CM | POA: Diagnosis not present

## 2023-05-18 DIAGNOSIS — F1990 Other psychoactive substance use, unspecified, uncomplicated: Secondary | ICD-10-CM

## 2023-05-18 DIAGNOSIS — Z87891 Personal history of nicotine dependence: Secondary | ICD-10-CM | POA: Diagnosis not present

## 2023-05-18 DIAGNOSIS — R0683 Snoring: Secondary | ICD-10-CM | POA: Diagnosis not present

## 2023-05-18 DIAGNOSIS — I513 Intracardiac thrombosis, not elsewhere classified: Secondary | ICD-10-CM | POA: Diagnosis not present

## 2023-05-18 DIAGNOSIS — F1491 Cocaine use, unspecified, in remission: Secondary | ICD-10-CM | POA: Insufficient documentation

## 2023-05-18 DIAGNOSIS — I5022 Chronic systolic (congestive) heart failure: Secondary | ICD-10-CM | POA: Diagnosis not present

## 2023-05-18 DIAGNOSIS — I130 Hypertensive heart and chronic kidney disease with heart failure and stage 1 through stage 4 chronic kidney disease, or unspecified chronic kidney disease: Secondary | ICD-10-CM | POA: Diagnosis not present

## 2023-05-18 DIAGNOSIS — N182 Chronic kidney disease, stage 2 (mild): Secondary | ICD-10-CM | POA: Diagnosis not present

## 2023-05-18 DIAGNOSIS — E669 Obesity, unspecified: Secondary | ICD-10-CM | POA: Diagnosis not present

## 2023-05-18 DIAGNOSIS — Z139 Encounter for screening, unspecified: Secondary | ICD-10-CM

## 2023-05-18 DIAGNOSIS — F1011 Alcohol abuse, in remission: Secondary | ICD-10-CM | POA: Insufficient documentation

## 2023-05-18 DIAGNOSIS — I493 Ventricular premature depolarization: Secondary | ICD-10-CM

## 2023-05-18 DIAGNOSIS — N1831 Chronic kidney disease, stage 3a: Secondary | ICD-10-CM

## 2023-05-18 DIAGNOSIS — I445 Left posterior fascicular block: Secondary | ICD-10-CM | POA: Diagnosis not present

## 2023-05-18 DIAGNOSIS — Z79899 Other long term (current) drug therapy: Secondary | ICD-10-CM | POA: Diagnosis not present

## 2023-05-18 DIAGNOSIS — Z6838 Body mass index (BMI) 38.0-38.9, adult: Secondary | ICD-10-CM | POA: Insufficient documentation

## 2023-05-18 DIAGNOSIS — I428 Other cardiomyopathies: Secondary | ICD-10-CM | POA: Insufficient documentation

## 2023-05-18 LAB — BASIC METABOLIC PANEL WITH GFR
Anion gap: 9 (ref 5–15)
BUN: 16 mg/dL (ref 6–20)
CO2: 28 mmol/L (ref 22–32)
Calcium: 9 mg/dL (ref 8.9–10.3)
Chloride: 100 mmol/L (ref 98–111)
Creatinine, Ser: 1.55 mg/dL — ABNORMAL HIGH (ref 0.61–1.24)
GFR, Estimated: 58 mL/min — ABNORMAL LOW (ref >60)
Glucose, Bld: 94 mg/dL (ref 70–99)
Potassium: 3.6 mmol/L (ref 3.5–5.1)
Sodium: 137 mmol/L (ref 135–145)

## 2023-05-18 LAB — BRAIN NATRIURETIC PEPTIDE: B Natriuretic Peptide: 1010.5 pg/mL — ABNORMAL HIGH (ref 0.0–100.0)

## 2023-05-18 MED ORDER — POTASSIUM CHLORIDE CRYS ER 20 MEQ PO TBCR
60.0000 meq | EXTENDED_RELEASE_TABLET | Freq: Every day | ORAL | 5 refills | Status: DC
  Filled 2023-05-18: qty 90, 30d supply, fill #0

## 2023-05-18 MED ORDER — SPIRONOLACTONE 25 MG PO TABS
25.0000 mg | ORAL_TABLET | Freq: Every day | ORAL | 6 refills | Status: DC
  Filled 2023-05-18: qty 30, 30d supply, fill #0

## 2023-05-18 MED ORDER — APIXABAN 5 MG PO TABS
5.0000 mg | ORAL_TABLET | Freq: Two times a day (BID) | ORAL | 3 refills | Status: DC
  Filled 2023-05-18: qty 180, 90d supply, fill #0

## 2023-05-18 MED ORDER — EMPAGLIFLOZIN 10 MG PO TABS
10.0000 mg | ORAL_TABLET | Freq: Every day | ORAL | 3 refills | Status: DC
  Filled 2023-05-18: qty 90, 90d supply, fill #0

## 2023-05-18 MED ORDER — TORSEMIDE 20 MG PO TABS
40.0000 mg | ORAL_TABLET | Freq: Two times a day (BID) | ORAL | 6 refills | Status: DC
  Filled 2023-05-18: qty 120, 30d supply, fill #0

## 2023-05-18 MED ORDER — METOLAZONE 2.5 MG PO TABS
2.5000 mg | ORAL_TABLET | ORAL | 0 refills | Status: DC
  Filled 2023-05-18: qty 2, 2d supply, fill #0

## 2023-05-18 MED ORDER — DIGOXIN 125 MCG PO TABS
0.1250 mg | ORAL_TABLET | Freq: Every day | ORAL | 3 refills | Status: DC
  Filled 2023-05-18: qty 30, 30d supply, fill #0

## 2023-05-18 MED ORDER — LOSARTAN POTASSIUM 25 MG PO TABS
25.0000 mg | ORAL_TABLET | Freq: Every day | ORAL | 6 refills | Status: DC
  Filled 2023-05-18: qty 30, 30d supply, fill #0

## 2023-05-18 NOTE — Telephone Encounter (Signed)
 Patient Advocate Encounter   Received notification from [employer] that prior authorization for Jardiance  is required.   PA submitted on CoverMyMeds Key [id number] Status is pending   Will continue to follow.

## 2023-05-18 NOTE — Patient Instructions (Signed)
 Medication Changes:  INCREASE TORSEMIDE  TO 40MG  TWICE DAILY   TAKE METOLAZONE  2.5MG  ONCE DAILY FOR THE NEXT 2 DAYS- WITH AN EXTRA (2) TABLETS OF POTASSIUM   INCREASE POTASSIUM TO (3) TABLETS ONCE DAILY   Lab Work:  Labs done today, your results will be available in MyChart, we will contact you for abnormal readings.  THEN RETURN FOR LABS AGAIN IN 10 DAYS AS SCHEDULED   Testing/Procedures:  Your physician has recommended that you have a sleep study. This test records several body functions during sleep, including: brain activity, eye movement, oxygen and carbon dioxide blood levels, heart rate and rhythm, breathing rate and rhythm, the flow of air through your mouth and nose, snoring, body muscle movements, and chest and belly movement.  SOMEONE WILL CALL YOU TO GET YOU SCHEDULED FOR THIS ONCE APPROVED WITH INSURANCE   Follow-Up in: 3-4 WEEKS AS SCHEDULED   At the [facility], you and your health needs are our priority. We have a designated team specialized in the treatment of Heart Failure. This Care Team includes your primary Heart Failure Specialized Cardiologist (physician), Advanced Practice Providers (APPs- Physician Assistants and Nurse Practitioners), and Pharmacist who all work together to provide you with the care you need, when you need it.   You may see any of the following providers on your designated Care Team at your next follow up:  Dr. Jules Oar Dr. Peder Bourdon Dr. Alwin Baars Dr. Judyth Nunnery Nieves Bars, NP Ruddy Corral, [place] [facility] [place], [place] Dennise Fitz, NP [place] Lee, NP Luster Salters, PharmD   Please be sure to bring in all your medications bottles to every appointment.   Need to Contact Us :  If you have any questions or concerns before your next appointment please send us  a message through [place] or call our office at [phone].    TO LEAVE A MESSAGE FOR THE NURSE SELECT OPTION 2, PLEASE  LEAVE A MESSAGE INCLUDING: YOUR NAME DATE OF BIRTH CALL BACK NUMBER REASON FOR CALL**this is important as we prioritize the call backs  YOU WILL RECEIVE A CALL BACK THE SAME DAY AS LONG AS YOU CALL BEFORE 4:00 PM

## 2023-05-18 NOTE — Telephone Encounter (Signed)
 Advanced Heart Failure Patient Advocate Encounter  Prior Authorization for Jardiance  has been approved.    PA# [id number] Effective dates: 05/18/23 through 05/17/24  Patients co-pay is $0 (90 days)  Correne Dillon, CPhT

## 2023-05-19 ENCOUNTER — Other Ambulatory Visit (HOSPITAL_COMMUNITY): Payer: Self-pay

## 2023-05-28 ENCOUNTER — Ambulatory Visit (HOSPITAL_COMMUNITY)
Admission: RE | Admit: 2023-05-28 | Discharge: 2023-05-28 | Disposition: A | Discharge status: Home / Self Care | Source: Ambulatory Visit | Attending: Adult Health | Admitting: Adult Health

## 2023-05-28 ENCOUNTER — Other Ambulatory Visit (HOSPITAL_COMMUNITY): Payer: Self-pay

## 2023-05-28 ENCOUNTER — Ambulatory Visit (HOSPITAL_COMMUNITY): Payer: Self-pay | Admitting: Family Medicine

## 2023-05-28 DIAGNOSIS — I5022 Chronic systolic (congestive) heart failure: Secondary | ICD-10-CM | POA: Insufficient documentation

## 2023-05-28 LAB — BASIC METABOLIC PANEL WITH GFR
Anion gap: 11 (ref 5–15)
BUN: 13 mg/dL (ref 6–20)
CO2: 24 mmol/L (ref 22–32)
Calcium: 9.2 mg/dL (ref 8.9–10.3)
Chloride: 102 mmol/L (ref 98–111)
Creatinine, Ser: 1.55 mg/dL — ABNORMAL HIGH (ref 0.61–1.24)
GFR, Estimated: 58 mL/min — ABNORMAL LOW
Glucose, Bld: 109 mg/dL — ABNORMAL HIGH (ref 70–99)
Potassium: 3.4 mmol/L — ABNORMAL LOW (ref 3.5–5.1)
Sodium: 137 mmol/L (ref 135–145)

## 2023-05-28 MED ORDER — POTASSIUM CHLORIDE CRYS ER 20 MEQ PO TBCR
EXTENDED_RELEASE_TABLET | ORAL | 11 refills | Status: DC
  Filled 2023-05-28: qty 150, 30d supply, fill #0

## 2023-05-28 NOTE — Telephone Encounter (Signed)
 Called patient per Vernia Good, NP with following lab results and instructions:  "K low, add extra 40 KCL to daily regimen. Will repeat labs at follow up on 5/29"  Pt verbalized understanding of same. Updated Rx sent; confirmed f/u appointment date and time with us .

## 2023-06-08 NOTE — Progress Notes (Incomplete)
 ADVANCED HF CLINIC NOTE  Primary Care: none HF Cardiologist: Dr. Julane Ny  Reason for Visit/CC: f/u for chronic systolic heart failure   HPI: Robert Daniels is a 38 y.o. with obesity, tobacco abuse and systolic heart failure due to NICM.   Admitted 3/23 with CP and + Hs-Troponin. Echo EF 20-25%. R/LHC with normal cors, well-compensated filling pressures, EF 25%. cMRI with LVEF 15%, RVEF 33%, findings consistent with myopericarditis. Viral panel negative. Started on GDMT and colchicine . Discharged home, weight 282 lbs.  No-showed for hospital follow up.  Seen in ED 8/23 with a/c CHF. Out of Entresto  for a few months. Diuresed with IV lasix . Echo showed  EF 20-25%, moderate LVH, RV mildly enlarged., LA moderately dilated. AHF consulted, Entresto  restarted and Lasix  20 mg daily continued.   Echo 12/23 EF 20-25%  He was seen in [facility] ED 09/2022 with shortness of breath and chest pressure after he had stopped taking his HF medications for a week.  Troponin negative X 2. ProBNP 5500. CTA chest with no PE but did show pulmonary edema. UDS + for cocaine and fentanyl . He diuresed with IV lasix  and felt to be stable for discharge home.  Echo 03/10/23 EF < 20%, ? LV thrombus.  Follow up 05/13/23, markedly volume overloaded. Declined admission, given Furoscix  x 3 days + metolazone  5 x 1 day.   Seen back on 05/18/23. Still volume overloaded, ReDs was elevated at 51%. Torsemide  was increased to 40 mg bid. Also instructed to take metolazone  daily x 2 days.   He presents back today for 3 wk f/u and reassessment of volume status. Wt 286 lb >>>*** today    *** Weight at home 285-290 pounds. Taking all medications. No ETOH x 2 weeks, no tobacco x 3 months. Has 4 kids (19,57,29,73), 47 year old severely autistic. No PCP yet.  Cardiac Studies - Echo 2/25: EF < 20%, ? LV thrombus.  - Echo (8/23): EF 20-25%, moderate LVH, RV low/normal.  - cMRI (3/23): EF 15% with markedly dilated LV with evidence of diffuse  myopericarditis. RV 33%  - R/LHC (3/23): normal cors EF 25% RA = 3. RV = 32/10. PA = 34/20 (26). PCW = 17 Fick cardiac output/index = 6.1/2.5. PVR = 1.5 WU. Ao sat = 94%. PA sat = 68%, 68%  Past Medical History:  Diagnosis Date   AKI (acute kidney injury) (HCC)    CHF (congestive heart failure) (HCC)    Gout    Hypertension    Current Outpatient Medications  Medication Sig Dispense Refill   apixaban  (ELIQUIS ) 5 MG TABS tablet Take 1 tablet (5 mg total) by mouth 2 (two) times daily. 180 tablet 3   digoxin  (LANOXIN ) 0.125 MG tablet Take 1 tablet (0.125 mg total) by mouth daily. 30 tablet 3   empagliflozin  (JARDIANCE ) 10 MG TABS tablet Take 1 tablet (10 mg total) by mouth daily before breakfast. 90 tablet 3   Furosemide  (FUROSCIX ) 80 MG/10ML CTKT Inject 80 mg into the skin as directed.     losartan  (COZAAR ) 25 MG tablet Take 1 tablet (25 mg total) by mouth daily. 30 tablet 6   metolazone  (ZAROXOLYN ) 2.5 MG tablet Take 1 tablet (2.5 mg total) by mouth once daily as directed for the next 2 days with 40meq of potassium. 2 tablet 0   potassium chloride  SA (KLOR-CON  M) 20 MEQ tablet Take 3 tablets (60 mEq total) by mouth every morning AND 2 tablets (40 mEq total) every evening. 150 tablet 11  spironolactone  (ALDACTONE ) 25 MG tablet Take 1 tablet (25 mg total) by mouth daily. 30 tablet 6   torsemide  (DEMADEX ) 20 MG tablet Take 2 tablets (40 mg total) by mouth 2 (two) times daily. 120 tablet 6   No current facility-administered medications for this visit.   No Known Allergies  Social History   Socioeconomic History   Marital status: Single    Spouse name: Not on file   Number of children: Not on file   Years of education: Not on file   Highest education level: Not on file  Occupational History   Not on file  Tobacco Use   Smoking status: Former    Types: Cigars, Cigarettes   Smokeless tobacco: Never  Vaping Use   Vaping status: Never Used  Substance and Sexual Activity   Alcohol  use: Yes    Comment: occ   Drug use: Never   Sexual activity: Not on file  Other Topics Concern   Not on file  Social History Narrative   ** Merged History Encounter **       Social Drivers of Health   Financial Resource Strain: High Risk (03/21/2021)   Overall Financial Resource Strain (CARDIA)    Difficulty of Paying Living Expenses: Hard  Food Insecurity: Not on file  Transportation Needs: Not on file  Physical Activity: Not on file  Stress: Not on file  Social Connections: Not on file  Intimate Partner Violence: Not on file   No family history on file.  There were no vitals taken for this visit.  Wt Readings from Last 3 Encounters:  05/18/23 129.9 kg (286 lb 6.4 oz)  05/13/23 134 kg (295 lb 6.4 oz)  04/13/23 132.7 kg (292 lb 9.6 oz)   PHYSICAL EXAM: General:  Well appearing. No respiratory difficulty HEENT: normal Neck: supple. no JVD. Carotids 2+ bilat; no bruits. No lymphadenopathy or thyromegaly appreciated. Cor: PMI nondisplaced. Regular rate & rhythm. No rubs, gallops or murmurs. Lungs: clear Abdomen: soft, nontender, nondistended. No hepatosplenomegaly. No bruits or masses. Good bowel sounds. Extremities: no cyanosis, clubbing, rash, edema Neuro: alert & oriented x 3, cranial nerves grossly intact. moves all 4 extremities w/o difficulty. Affect pleasant.   ReDs reading: ***, abnormal  ECG (personally reviewed):  ***  ASSESSMENT & PLAN:  1. 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 - cMRI (3/23): LVEF 15% with markedly dilated LV, RVEF 33%, with evidence of diffuse myopericarditis. Viral panel (-) - Echo (8/23) EF 20-25%, moderate LVH, RV low/normal. - Echo (12/23): EF 20-25 RV mildly down  - Echo 2/25: EF<20%, RV moderately reduced, ? thrombus - NYHA Class ***, Volume *** ReDs ***%  - Continue torsemide  40 mg bid  - Continue metolazone   ***  - Continue losartan  25 mg daily. (He is unable to provide POI for Entresto  application) - Continue spironolactone  25 mg daily. - Continue Jardiance  10 mg daily.  - Continue digoxin  0.125 mg daily. Check Dig level  - Off beta blocker 2/25 with low output - Not a candidate for advanced therapies or ICD until he can abstain from substances and demonstrate better compliance - Consider CPX testing when available - Check BMP today   2. LV thrombus - Recent echo appears to have possible LV clot - Continue Eliquis  5 mg bid.    3. Substance abuse  - Past UDS + for cocaine and fentanyl ; says he doesn't "  use" cocaine but "touches" it.  - No ETOH x 3 weeks, no tobacco in 3 months ***     4. CKD II/IIIa - Baseline SCr 1.2-1.4 - on SGLT2i, Jardiance  10 mg daily  - check BMP today   5. Snoring - Concern for OSA - sleep study has been ordered, not completed yet   6. PVCs - *** EKG today  - Needs sleep study as above   7. SDOH - Now has Medicaid - He has transportation - [facility]FSW helping with resources - given list of PCPs today to establish care ***   Follow up in 3-4 weeks with APP.  Read Bonelli, PA-C  10:45 AM

## 2023-06-09 ENCOUNTER — Telehealth (HOSPITAL_COMMUNITY): Payer: Self-pay

## 2023-06-09 NOTE — Telephone Encounter (Signed)
 Called to confirm/remind patient of their appointment at the [facility] on 06/10/23.   Appointment:   [] Confirmed  [x] Left mess   [] No answer/No voice mail  [] VM Full/unable to leave message  [] Phone not in service  And to bring in all medications and/or complete list.

## 2023-06-10 ENCOUNTER — Emergency Department (HOSPITAL_COMMUNITY)

## 2023-06-10 ENCOUNTER — Other Ambulatory Visit: Payer: Self-pay

## 2023-06-10 ENCOUNTER — Ambulatory Visit (HOSPITAL_COMMUNITY)
Admission: RE | Admit: 2023-06-10 | Discharge: 2023-06-10 | Disposition: A | Discharge status: Home / Self Care | Source: Ambulatory Visit | Attending: Cardiology

## 2023-06-10 ENCOUNTER — Other Ambulatory Visit (HOSPITAL_COMMUNITY): Payer: Self-pay

## 2023-06-10 ENCOUNTER — Encounter (HOSPITAL_COMMUNITY): Payer: Self-pay

## 2023-06-10 ENCOUNTER — Telehealth (HOSPITAL_COMMUNITY): Payer: Self-pay | Admitting: Pharmacy Technician

## 2023-06-10 ENCOUNTER — Inpatient Hospital Stay (HOSPITAL_COMMUNITY)
Admission: EM | Admit: 2023-06-10 | Discharge: 2023-06-14 | DRG: 286 | Disposition: A | Discharge status: Home / Self Care | Attending: Cardiology | Admitting: Cardiology

## 2023-06-10 DIAGNOSIS — Z7984 Long term (current) use of oral hypoglycemic drugs: Secondary | ICD-10-CM

## 2023-06-10 DIAGNOSIS — Z5986 Financial insecurity: Secondary | ICD-10-CM

## 2023-06-10 DIAGNOSIS — Z87891 Personal history of nicotine dependence: Secondary | ICD-10-CM | POA: Insufficient documentation

## 2023-06-10 DIAGNOSIS — R0683 Snoring: Secondary | ICD-10-CM | POA: Diagnosis not present

## 2023-06-10 DIAGNOSIS — I428 Other cardiomyopathies: Secondary | ICD-10-CM | POA: Diagnosis present

## 2023-06-10 DIAGNOSIS — F1411 Cocaine abuse, in remission: Secondary | ICD-10-CM | POA: Insufficient documentation

## 2023-06-10 DIAGNOSIS — E669 Obesity, unspecified: Secondary | ICD-10-CM | POA: Insufficient documentation

## 2023-06-10 DIAGNOSIS — Z7901 Long term (current) use of anticoagulants: Secondary | ICD-10-CM

## 2023-06-10 DIAGNOSIS — R0602 Shortness of breath: Secondary | ICD-10-CM | POA: Insufficient documentation

## 2023-06-10 DIAGNOSIS — I5021 Acute systolic (congestive) heart failure: Secondary | ICD-10-CM | POA: Diagnosis present

## 2023-06-10 DIAGNOSIS — Z79899 Other long term (current) drug therapy: Secondary | ICD-10-CM | POA: Insufficient documentation

## 2023-06-10 DIAGNOSIS — R6510 Systemic inflammatory response syndrome (SIRS) of non-infectious origin without acute organ dysfunction: Secondary | ICD-10-CM | POA: Diagnosis present

## 2023-06-10 DIAGNOSIS — I319 Disease of pericardium, unspecified: Secondary | ICD-10-CM | POA: Insufficient documentation

## 2023-06-10 DIAGNOSIS — I130 Hypertensive heart and chronic kidney disease with heart failure and stage 1 through stage 4 chronic kidney disease, or unspecified chronic kidney disease: Principal | ICD-10-CM | POA: Diagnosis present

## 2023-06-10 DIAGNOSIS — I5023 Acute on chronic systolic (congestive) heart failure: Secondary | ICD-10-CM | POA: Diagnosis present

## 2023-06-10 DIAGNOSIS — N1831 Chronic kidney disease, stage 3a: Secondary | ICD-10-CM | POA: Diagnosis present

## 2023-06-10 DIAGNOSIS — Z6839 Body mass index (BMI) 39.0-39.9, adult: Secondary | ICD-10-CM

## 2023-06-10 DIAGNOSIS — N182 Chronic kidney disease, stage 2 (mild): Secondary | ICD-10-CM | POA: Insufficient documentation

## 2023-06-10 DIAGNOSIS — I5022 Chronic systolic (congestive) heart failure: Secondary | ICD-10-CM

## 2023-06-10 DIAGNOSIS — I309 Acute pericarditis, unspecified: Secondary | ICD-10-CM | POA: Diagnosis present

## 2023-06-10 LAB — COMPREHENSIVE METABOLIC PANEL WITH GFR
ALT: 25 U/L (ref 0–44)
AST: 25 U/L (ref 15–41)
Albumin: 3.5 g/dL (ref 3.5–5.0)
Alkaline Phosphatase: 44 U/L (ref 38–126)
Anion gap: 13 (ref 5–15)
BUN: 15 mg/dL (ref 6–20)
CO2: 21 mmol/L — ABNORMAL LOW (ref 22–32)
Calcium: 9.2 mg/dL (ref 8.9–10.3)
Chloride: 103 mmol/L (ref 98–111)
Creatinine, Ser: 1.53 mg/dL — ABNORMAL HIGH (ref 0.61–1.24)
GFR, Estimated: 59 mL/min — ABNORMAL LOW (ref >60)
Glucose, Bld: 112 mg/dL — ABNORMAL HIGH (ref 70–99)
Potassium: 4.4 mmol/L (ref 3.5–5.1)
Sodium: 137 mmol/L (ref 135–145)
Total Bilirubin: 1.6 mg/dL — ABNORMAL HIGH (ref 0.0–1.2)
Total Protein: 7.5 g/dL (ref 6.5–8.1)

## 2023-06-10 LAB — CBC
HCT: 36.2 % — ABNORMAL LOW (ref 39.0–52.0)
Hemoglobin: 11.7 g/dL — ABNORMAL LOW (ref 13.0–17.0)
MCH: 29 pg (ref 26.0–34.0)
MCHC: 32.3 g/dL (ref 30.0–36.0)
MCV: 89.8 fL (ref 80.0–100.0)
Platelets: 304 10*3/uL (ref 150–400)
RBC: 4.03 MIL/uL — ABNORMAL LOW (ref 4.22–5.81)
RDW: 14.1 % (ref 11.5–15.5)
WBC: 10.2 10*3/uL (ref 4.0–10.5)
nRBC: 0 % (ref 0.0–0.2)

## 2023-06-10 LAB — URINALYSIS, ROUTINE W REFLEX MICROSCOPIC
Bacteria, UA: NONE SEEN
Bilirubin Urine: NEGATIVE
Glucose, UA: >=500 mg/dL — AB
Hgb urine dipstick: NEGATIVE
Ketones, ur: NEGATIVE mg/dL
Leukocytes,Ua: NEGATIVE
Nitrite: NEGATIVE
Protein, ur: NEGATIVE mg/dL
Specific Gravity, Urine: 1.002 — ABNORMAL LOW (ref 1.005–1.030)
pH: 7 (ref 5.0–8.0)

## 2023-06-10 LAB — RESP PANEL BY RT-PCR (RSV, FLU A&B, COVID)  RVPGX2
Influenza A by PCR: NEGATIVE
Influenza B by PCR: NEGATIVE
Resp Syncytial Virus by PCR: NEGATIVE
SARS Coronavirus 2 by RT PCR: NEGATIVE

## 2023-06-10 LAB — COOXEMETRY PANEL
Carboxyhemoglobin: 1.1 % (ref 0.5–1.5)
Methemoglobin: 1.3 % (ref 0.0–1.5)
O2 Saturation: 18.1 %
Total hemoglobin: 12.1 g/dL (ref 12.0–16.0)

## 2023-06-10 LAB — I-STAT CG4 LACTIC ACID, ED: Lactic Acid, Venous: 1.4 mmol/L (ref 0.5–1.9)

## 2023-06-10 LAB — TROPONIN I (HIGH SENSITIVITY)
Troponin I (High Sensitivity): 36 ng/L — ABNORMAL HIGH (ref <18)
Troponin I (High Sensitivity): 36 ng/L — ABNORMAL HIGH (ref <18)

## 2023-06-10 LAB — HIV ANTIBODY (ROUTINE TESTING W REFLEX): HIV Screen 4th Generation wRfx: NONREACTIVE

## 2023-06-10 LAB — PROCALCITONIN: Procalcitonin: 0.42 ng/mL

## 2023-06-10 LAB — DIGOXIN LEVEL: Digoxin Level: 0.4 ng/mL — ABNORMAL LOW (ref 0.8–2.0)

## 2023-06-10 LAB — BRAIN NATRIURETIC PEPTIDE: B Natriuretic Peptide: 1504 pg/mL — ABNORMAL HIGH (ref 0.0–100.0)

## 2023-06-10 MED ORDER — DIGOXIN 125 MCG PO TABS
0.1250 mg | ORAL_TABLET | Freq: Every day | ORAL | Status: DC
  Administered 2023-06-10 – 2023-06-14 (×5): 0.125 mg via ORAL
  Filled 2023-06-10 (×5): qty 1

## 2023-06-10 MED ORDER — PIPERACILLIN-TAZOBACTAM 3.375 G IVPB
3.3750 g | Freq: Three times a day (TID) | INTRAVENOUS | Status: DC
  Administered 2023-06-10 – 2023-06-11 (×2): 3.375 g via INTRAVENOUS
  Filled 2023-06-10 (×2): qty 50

## 2023-06-10 MED ORDER — APIXABAN 5 MG PO TABS
5.0000 mg | ORAL_TABLET | Freq: Two times a day (BID) | ORAL | Status: DC
  Administered 2023-06-10 – 2023-06-14 (×9): 5 mg via ORAL
  Filled 2023-06-10 (×9): qty 1

## 2023-06-10 MED ORDER — VANCOMYCIN HCL 10 G IV SOLR
2500.0000 mg | Freq: Once | INTRAVENOUS | Status: AC
  Administered 2023-06-10: 2500 mg via INTRAVENOUS
  Filled 2023-06-10: qty 2500

## 2023-06-10 MED ORDER — FUROSEMIDE 10 MG/ML IJ SOLN: 80.0000 mg | Freq: Two times a day (BID) | INTRAMUSCULAR | Status: DC

## 2023-06-10 MED ORDER — CHLORHEXIDINE GLUCONATE CLOTH 2 % EX PADS
6.0000 | MEDICATED_PAD | Freq: Every day | CUTANEOUS | Status: DC
  Administered 2023-06-10 – 2023-06-14 (×5): 6 via TOPICAL

## 2023-06-10 MED ORDER — SODIUM CHLORIDE 0.9% FLUSH: 3.0000 mL | INTRAVENOUS | Status: DC | PRN

## 2023-06-10 MED ORDER — LOSARTAN POTASSIUM 25 MG PO TABS
25.0000 mg | ORAL_TABLET | Freq: Every day | ORAL | Status: DC
  Administered 2023-06-10 – 2023-06-14 (×5): 25 mg via ORAL
  Filled 2023-06-10 (×5): qty 1

## 2023-06-10 MED ORDER — ACETAMINOPHEN 325 MG PO TABS
650.0000 mg | ORAL_TABLET | ORAL | Status: DC | PRN
  Administered 2023-06-10 – 2023-06-13 (×2): 650 mg via ORAL
  Filled 2023-06-10 (×2): qty 2

## 2023-06-10 MED ORDER — FUROSEMIDE 10 MG/ML IJ SOLN
80.0000 mg | Freq: Once | INTRAMUSCULAR | Status: AC
  Administered 2023-06-10: 80 mg via INTRAVENOUS
  Filled 2023-06-10: qty 8

## 2023-06-10 MED ORDER — SPIRONOLACTONE 25 MG PO TABS
25.0000 mg | ORAL_TABLET | Freq: Every day | ORAL | Status: DC
  Administered 2023-06-10 – 2023-06-14 (×5): 25 mg via ORAL
  Filled 2023-06-10 (×5): qty 1

## 2023-06-10 MED ORDER — FUROSEMIDE 10 MG/ML IJ SOLN
80.0000 mg | Freq: Two times a day (BID) | INTRAMUSCULAR | Status: DC
  Administered 2023-06-10 – 2023-06-13 (×7): 80 mg via INTRAVENOUS
  Filled 2023-06-10 (×7): qty 8

## 2023-06-10 MED ORDER — SODIUM CHLORIDE 0.9% FLUSH
3.0000 mL | Freq: Two times a day (BID) | INTRAVENOUS | Status: DC
  Administered 2023-06-10 – 2023-06-14 (×9): 3 mL via INTRAVENOUS

## 2023-06-10 MED ORDER — SODIUM CHLORIDE 0.9 % IV SOLN: 250.0000 mL | INTRAVENOUS | Status: AC | PRN

## 2023-06-10 MED ORDER — SODIUM CHLORIDE 0.9% FLUSH: 10.0000 mL | INTRAVENOUS | Status: DC | PRN

## 2023-06-10 MED ORDER — SODIUM CHLORIDE 0.9% FLUSH
10.0000 mL | Freq: Two times a day (BID) | INTRAVENOUS | Status: DC
  Administered 2023-06-10: 40 mL
  Administered 2023-06-11 – 2023-06-14 (×7): 10 mL

## 2023-06-10 MED ORDER — VANCOMYCIN HCL IN DEXTROSE 1-5 GM/200ML-% IV SOLN
1000.0000 mg | Freq: Two times a day (BID) | INTRAVENOUS | Status: DC
  Filled 2023-06-10: qty 200

## 2023-06-10 MED ORDER — EMPAGLIFLOZIN 10 MG PO TABS
10.0000 mg | ORAL_TABLET | Freq: Every day | ORAL | Status: DC
  Administered 2023-06-10 – 2023-06-14 (×5): 10 mg via ORAL
  Filled 2023-06-10 (×5): qty 1

## 2023-06-10 NOTE — Progress Notes (Addendum)
 Paged about new fever to 102.72F. With his sinus tachycardia, fevers, tachypnea 24 (SIRS positive, qSOFA negative) and new RLL infiltrate on his most recent CXR could be consistent with an emerging pneumonia vs his presumed myopericarditis (though I would suspect the fever to be lower that 102.72F). Out of abundance of caution, will workup for infectious etiologies with blood cultures x2, UA, and procalcitonin, and start on broad spectrum antibiotics for now given risk factors with IVDU. I suspect that these can be narrowed/weaned off if this infectious workup comes back negative.  Aubrey Leaf, MD Cardiology 06/10/2023

## 2023-06-10 NOTE — Progress Notes (Signed)
 New patient admitted from the ed with increased shortness of breath. Patient is less short of breath at rest. Patient denies chest pain. Patient has pain that is intermittent in the epigastric area. Possible hernia. Trace ankle feet and lower extremity edema. Lungs clear and decreased bilaterally. Patient is on room air. Skin good checked with Amalia Badder. No family with patient. MP shows sinus sinus tachycardia. Patient is a/o x4.

## 2023-06-10 NOTE — Progress Notes (Signed)
 Pharmacy Antibiotic Note  Robert Daniels is a 38 y.o. male admitted on 06/10/2023 with AoC HF.  Pharmacy has been consulted for vancomycin and Zosyn dosing for PNA vs myopericarditis.  SCr 1.53, Tmax 102.7, WBC WNL.  Plan: Vanc 2500mg  IV x 1, then 1gm IV Q12H for AUC 492 using SCr 1.53 Zosyn EID 3.375gm IV Q8H Monitor renal fxn, clinical progress, vanc levels as indicated Check MRSA PCR  Height: 6' (182.9 cm) Weight: 127.8 kg (281 lb 12 oz) IBW/kg (Calculated) : 77.6  Temp (24hrs), Avg:100.5 F (38.1 C), Min:98.7 F (37.1 C), Max:102.7 F (39.3 C)  Recent Labs  Lab 06/10/23 1024 06/10/23 1026 06/10/23 1034  WBC 10.2  --   --   CREATININE  --  1.53*  --   LATICACIDVEN  --   --  1.4    Estimated Creatinine Clearance: 90.5 mL/min (A) (by C-G formula based on SCr of 1.53 mg/dL (H)).    No Known Allergies  Vanc 5/29 >> Zosyn 5/29 >>  5/29 BCx -   Horris Speros D. Marikay Show, PharmD, BCPS, BCCCP 06/10/2023, 9:18 PM

## 2023-06-10 NOTE — Progress Notes (Signed)
 ReDS Vest / Clip - 06/10/23 0900       ReDS Vest / Clip   Station Marker D    Ruler Value 39.5    ReDS Value Range High volume overload    ReDS Actual Value 49

## 2023-06-10 NOTE — Progress Notes (Signed)
 Peripherally Inserted Central Catheter Placement  The IV Nurse has discussed with the patient and/or persons authorized to consent for the patient, the purpose of this procedure and the potential benefits and risks involved with this procedure.  The benefits include less needle sticks, lab draws from the catheter, and the patient may be discharged home with the catheter. Risks include, but not limited to, infection, bleeding, blood clot (thrombus formation), and puncture of an artery; nerve damage and irregular heartbeat and possibility to perform a PICC exchange if needed/ordered by physician.  Alternatives to this procedure were also discussed.  Bard Power PICC patient education guide, fact sheet on infection prevention and patient information card has been provided to patient /or left at bedside.    PICC Placement Documentation  PICC Double Lumen 06/10/23 Right Brachial 42 cm 1 cm (Active)  Indication for Insertion or Continuance of Line Chronic illness with exacerbations (CF, Sickle Cell, etc.) 06/10/23 1639  Exposed Catheter (cm) 1 cm 06/10/23 1639  Site Assessment Clean, Dry, Intact 06/10/23 1639  Lumen #1 Status Saline locked;Blood return noted 06/10/23 1639  Lumen #2 Status Saline locked;Blood return noted 06/10/23 1639  Dressing Type Transparent;Securing device 06/10/23 1639  Dressing Status Antimicrobial disc/dressing in place;Clean, Dry, Intact 06/10/23 1639  Line Care Connections checked and tightened 06/10/23 1639  Line Adjustment (NICU/IV Team Only) No 06/10/23 1639  Dressing Intervention New dressing;Adhesive placed at insertion site (IV team only) 06/10/23 1639  Dressing Change Due 06/17/23 06/10/23 1639       Lulu Sales Chenice 06/10/2023, 4:39 PM
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
## 2023-06-10 NOTE — ED Provider Triage Note (Signed)
 Emergency Medicine Provider Triage Evaluation Note  Robert Daniels , a 38 y.o. male  was evaluated in triage.  Pt complains of shob. Sent by cardiology clinic for admission.  Review of Systems  Positive: Shob, orthopnea, chest pain, anorexia Negative: Leg swelling  Physical Exam  BP 108/84 (BP Location: Right Arm)   Pulse (!) 113   Temp 100.1 F (37.8 C)   Resp (!) 24   Ht 6' (1.829 m)   Wt 132.9 kg   SpO2 97%   BMI 39.74 kg/m  Gen:   Awake, no distress   Resp:  Normal effort  MSK:   Moves extremities without difficulty  Other:  Trace b/l edema  Medical Decision Making  Medically screening exam initiated at 10:53 AM.  Appropriate orders placed.  Robert Daniels was informed that the remainder of the evaluation will be completed by another provider, this initial triage assessment does not replace that evaluation, and the importance of remaining in the ED until their evaluation is complete.  Needs labs and room for likely admission. Pt reports medication compliance   Mordecai Applebaum, MD 06/10/23 1055

## 2023-06-10 NOTE — Telephone Encounter (Signed)
 Pharmacy Patient Advocate Encounter  Insurance verification completed.   The patient is insured through [employer]   Ran test claim for Entresto . Currently a quantity of 180 is a 90 day supply and the co-pay is $4 .  This test claim was processed through [facility]- copay amounts may vary at other pharmacies due to pharmacy/plan contracts, or as the patient moves through the different stages of their insurance plan.   Correne Dillon, CPhT

## 2023-06-10 NOTE — ED Triage Notes (Signed)
 Reports not feeling well for last few days and having sob and low grade fever.  Reports sob and coughing is worse when trying to lie down.  Denies leg swelling but keeps stating he has a bad heart

## 2023-06-10 NOTE — Plan of Care (Signed)
 Just admitted to [facility] [address]. Patient sob with minimal exertion.

## 2023-06-10 NOTE — H&P (Addendum)
 Advanced Heart Failure Team History and Physical Note   PCP:  Pcp, No  PCP-Cardiology: Dr. Julane Ny   Reason for Admission: acute  on chronic systolic heart failure   HPI:    Robert Daniels is a 38 y.o. with obesity, tobacco abuse and systolic heart failure due to NICM.   Admitted 3/23 with CP and + Hs-Troponin. Echo EF 20-25%. R/LHC with normal cors, well-compensated filling pressures, EF 25%. cMRI with LVEF 15%, RVEF 33%, findings consistent with myopericarditis. Viral panel negative. Started on GDMT and colchicine . Discharged home, weight 282 lbs.   No-showed for hospital follow up.   Seen in ED 8/23 with a/c CHF. Out of Entresto  for a few months. Diuresed with IV lasix . Echo showed  EF 20-25%, moderate LVH, RV mildly enlarged., LA moderately dilated. AHF consulted, Entresto  restarted and Lasix  20 mg daily continued.    Echo 12/23 EF 20-25%   He was seen in [facility] ED 09/2022 with shortness of breath and chest pressure after he had stopped taking his HF medications for a week.  Troponin negative X 2. ProBNP 5500. CTA chest with no PE but did show pulmonary edema. UDS + for cocaine and fentanyl . He diuresed with IV lasix  and felt to be stable for discharge home.   Echo 03/10/23 EF < 20%, ? LV thrombus.   Follow up 05/13/23, markedly volume overloaded. Declined admission, given Furoscix  x 3 days + metolazone  5 x 1 day.    Seen back on 05/18/23. Still volume overloaded, ReDs was elevated at 51%. Torsemide  was increased to 40 mg bid. Also instructed to take metolazone  daily x 2 days.    He presented back to clinic today for 3 wk f/u and reassessment of volume status. He continues to feel poorly. SOB w/ minimal exertion and at rest, orthopneic + nocturnal cough. + abdominal fullness and early saeity. Wt continues to trend up 286 lb >>>293 lb today. ReDs still elevated at 49%.    He reports compliance w/ meds. BP 120/102. EKG shows sinus tach 113 bpm. No PVCs    He also reports subjective  fevers and wonders if he has an URI.   He was sent to the ED for admission for a/c CHF and treatment w/ IV diuretics.    Home Medications Prior to Admission medications   Medication Sig Start Date End Date Taking? Authorizing Provider  apixaban  (ELIQUIS ) 5 MG TABS tablet Take 1 tablet (5 mg total) by mouth 2 (two) times daily. 05/18/23   Milford, Arlice Bene, FNP  digoxin  (LANOXIN ) 0.125 MG tablet Take 1 tablet (0.125 mg total) by mouth daily. 05/18/23   Milford, Arlice Bene, FNP  empagliflozin  (JARDIANCE ) 10 MG TABS tablet Take 1 tablet (10 mg total) by mouth daily before breakfast. 05/18/23   Milford, Arlice Bene, FNP  Furosemide  (FUROSCIX ) 80 MG/10ML CTKT Inject 80 mg into the skin as directed. 05/13/23   Sheryl Donna, NP  losartan  (COZAAR ) 25 MG tablet Take 1 tablet (25 mg total) by mouth daily. 05/18/23 08/16/23  Elmarie Hacking, FNP  potassium chloride  SA (KLOR-CON  M) 20 MEQ tablet Take 3 tablets (60 mEq total) by mouth every morning AND 2 tablets (40 mEq total) every evening. 05/28/23   Milford, Arlice Bene, FNP  spironolactone  (ALDACTONE ) 25 MG tablet Take 1 tablet (25 mg total) by mouth daily. 05/18/23   Milford, Arlice Bene, FNP  torsemide  (DEMADEX ) 20 MG tablet Take 2 tablets (40 mg total) by mouth 2 (two) times daily. 05/18/23  Milford, Arlice Bene, FNP    Past Medical History: Past Medical History:  Diagnosis Date   AKI (acute kidney injury) (HCC)    CHF (congestive heart failure) (HCC)    Gout    Hypertension     Past Surgical History: Past Surgical History:  Procedure Laterality Date   RIGHT/LEFT HEART CATH AND CORONARY ANGIOGRAPHY N/A 03/20/2021   Procedure: RIGHT/LEFT HEART CATH AND CORONARY ANGIOGRAPHY;  Surgeon: Mardell Shade, MD;  Location: [facility];  Service: Cardiovascular;  Laterality: N/A;    Family History:  No family h/o CHF or SCD   Social History: Social History   Socioeconomic History   Marital status: Single    Spouse name: Not on file   Number of  children: Not on file   Years of education: Not on file   Highest education level: Not on file  Occupational History   Not on file  Tobacco Use   Smoking status: Former    Types: Cigars, Cigarettes   Smokeless tobacco: Never  Vaping Use   Vaping status: Never Used  Substance and Sexual Activity   Alcohol use: Yes    Comment: occ   Drug use: Never   Sexual activity: Not on file  Other Topics Concern   Not on file  Social History Narrative   ** Merged History Encounter **       Social Drivers of Health   Financial Resource Strain: High Risk (03/21/2021)   Overall Financial Resource Strain (CARDIA)    Difficulty of Paying Living Expenses: Hard  Food Insecurity: Not on file  Transportation Needs: Not on file  Physical Activity: Not on file  Stress: Not on file  Social Connections: Not on file    Allergies:  No Known Allergies  Objective:    Vital Signs:   Temp:  [100.1 F (37.8 C)] 100.1 F (37.8 C) (05/29 1019) Pulse Rate:  [113] 113 (05/29 1019) Resp:  [24] 24 (05/29 1019) BP: (108)/(84) 108/84 (05/29 1019) SpO2:  [97 %] 97 % (05/29 1019) Weight:  [132.9 kg] 132.9 kg (05/29 1021)   Filed Weights   06/10/23 1021  Weight: 132.9 kg     Physical Exam     General:  obese. Mild conversational dyspnea  HEENT: normal Neck: supple. JVD elevated to jaw  Cor:  Regular rhythm and tachy rate No rubs, gallops or murmurs. Lungs: decreased BS at the bases bilaterally  Abdomen: ++distended. NT  Extremities: no cyanosis, clubbing, rash, 2+ b/l LE edema up to thighs Neuro: alert & oriented x 3, cranial nerves grossly intact. moves all 4 extremities w/o difficulty. Affect flat      ReDs reading: 49%, abnormal   Telemetry   Sinus tach 110s, personally reviewed   EKG   Sinus tach 113 bpm   Labs     Basic Metabolic Panel: No results for input(s): "NA", "K", "CL", "CO2", "GLUCOSE", "BUN", "CREATININE", "CALCIUM", "MG", "PHOS" in the last 168 hours.  Liver  Function Tests: No results for input(s): "AST", "ALT", "ALKPHOS", "BILITOT", "PROT", "ALBUMIN" in the last 168 hours. No results for input(s): "LIPASE", "AMYLASE" in the last 168 hours. No results for input(s): "AMMONIA" in the last 168 hours.  CBC: Recent Labs  Lab 06/10/23 1024  WBC 10.2  HGB 11.7*  HCT 36.2*  MCV 89.8  PLT 304    Cardiac Enzymes: No results for input(s): "CKTOTAL", "CKMB", "CKMBINDEX", "TROPONINI" in the last 168 hours.  BNP: BNP (last 3 results) Recent Labs  04/13/23 1124 05/13/23 1126 05/18/23 0949  BNP 445.4* 1,632.8* 1,010.5*    ProBNP (last 3 results) No results for input(s): "PROBNP" in the last 8760 hours.   CBG: No results for input(s): "GLUCAP" in the last 168 hours.  Coagulation Studies: No results for input(s): "LABPROT", "INR" in the last 72 hours.  Imaging: No results found.   Patient Profile   Robert Daniels is a 38 y.o. with obesity, tobacco abuse and systolic heart failure due to NICM (myopericarditis) and h/o substance use, admitted for a/c CHF w/ marked volume overload and NYHA Class IIIb-IV symptoms.   Assessment/Plan   1. Acute on Chronic Systolic Heart Failure, NYHA IIIb-IV - due to acute myopericarditis/NICM  - Echo (3/23): EF 25% - R/LHC (3/23): normal cors and well compensated filling pressures. - cMRI (3/23): LVEF 15% with markedly dilated LV, RVEF 33%, with evidence of diffuse myopericarditis. Viral panel (-) - Echo (8/23) EF 20-25%, moderate LVH, RV low/normal. - Echo (12/23): EF 20-25 RV mildly down  - Echo 2/25: EF<20%, RV moderately reduced, ? thrombus - NYHA Class IIIb-IV, Volume overloaded on exam,  ReDs 49%. Failed escalation of home diuretic regimen x 2.  - Start Lasix  80 mg bid. Will likely need metolazone  +/- diamox to augment diuresis depending on response  - Check CMP and Lactic acid. Concern for possible low output. May need inotropes if difficulty diuresing  - Continue losartan  25 mg daily. (He is  unable to provide POI for Entresto  application) - Continue spironolactone  25 mg daily. - Continue Jardiance  10 mg daily.  - Continue digoxin  0.125 mg daily. Check Dig level  - Off beta blocker 2/25 with low output - Not a candidate for advanced therapies or ICD until he can abstain from substances and demonstrate better compliance - w/ h/o myopericarditis, concern that he could be developing restrictive physiology. Will need RHC during admission    2. LV thrombus - Recent echo appears to have possible LV clot - Continue Eliquis  5 mg bid    3. H/o Substance abuse  - Past UDS + for cocaine and fentanyl . He denies any further use   - Denies further ETOH use, no tobacco in 3 months  - check UDS on admission     4. CKD II/IIIa - Baseline SCr 1.2-1.4 - on SGLT2i, Jardiance  10 mg daily  - check CMP on admit and follow BMP daily     5. Snoring - Concern for OSA - sleep study has been ordered, not completed yet  - will need to schedule post hospital d/c    6. H/o PVCs - non observed on EKG today  - monitor on hospital tele  - Needs sleep study as above  7. Fever - check respiratory panel    Ruddy Corral, PA-C 06/10/2023, 11:18 AM Total Time Spent 30 min  Advanced Heart Failure Team Pager [phone] (M-F; 7a - 5p)  Please contact [facility] for night-coverage after hours (4p -7a ) and weekends on [email or web address]  Patient seen and examined with the above-signed Advanced Practice Provider and/or Housestaff. I personally reviewed laboratory data, imaging studies and relevant notes. I independently examined the patient and formulated the important aspects of the plan. I have edited the note to reflect any of my changes or salient points. I have personally discussed the plan with the patient and/or family.  38 y/o male as above with h/o polysubstance abuse, severe NICM EF < 20%, pericarditis. Seen several times recently with volume overload. Has not responded to intensification  fof  medical therapy. Now markedly volume overloaded with NYHA IV symptoms     General:  Sitting in chair No resp difficulty HEENT: normal Neck: supple. JVP to ear Carotids 2+ bilat; no bruits. No lymphadenopathy or thryomegaly appreciated. Cor: Irr tachy No rubs, gallops or murmurs. Lungs: clear Abdomen: obese soft, nontender, nondistended. No hepatosplenomegaly. No bruits or masses. Good bowel sounds. Extremities: no cyanosis, clubbing, rash, 3+ edema Neuro: alert & orientedx3, cranial nerves grossly intact. moves all 4 extremities w/o difficulty. Affect pleasant  He has decompensated HF not responding to intensification of oral regimen. Would admit for diuresis. Will likely also need RHC.  Repeat echo to reassess EF and for possible constriction   Seems to be nearing time for advanced therapies but not sure he is candidate.  Jules Oar, MD  3:40 PM

## 2023-06-10 NOTE — Progress Notes (Signed)
 Heart Failure Navigator Progress Note  Assessed for Heart & Vascular TOC clinic readiness.  Patient does not meet criteria due to Advanced Heart Failure Team patient of Dr. Gala Romney. .   Navigator will sign off at this time.   Rhae Hammock, BSN, Scientist, clinical (histocompatibility and immunogenetics) Only

## 2023-06-11 LAB — CBC
HCT: 34.7 % — ABNORMAL LOW (ref 39.0–52.0)
Hemoglobin: 11.7 g/dL — ABNORMAL LOW (ref 13.0–17.0)
MCH: 29.4 pg (ref 26.0–34.0)
MCHC: 33.7 g/dL (ref 30.0–36.0)
MCV: 87.2 fL (ref 80.0–100.0)
Platelets: 318 10*3/uL (ref 150–400)
RBC: 3.98 MIL/uL — ABNORMAL LOW (ref 4.22–5.81)
RDW: 14.1 % (ref 11.5–15.5)
WBC: 12.2 10*3/uL — ABNORMAL HIGH (ref 4.0–10.5)
nRBC: 0 % (ref 0.0–0.2)

## 2023-06-11 LAB — COOXEMETRY PANEL
Carboxyhemoglobin: 1.7 % — ABNORMAL HIGH (ref 0.5–1.5)
Methemoglobin: <0.7 % (ref 0.0–1.5)
O2 Saturation: 56 %
Total hemoglobin: 11.8 g/dL — ABNORMAL LOW (ref 12.0–16.0)

## 2023-06-11 LAB — BASIC METABOLIC PANEL WITH GFR
Anion gap: 11 (ref 5–15)
Anion gap: 14 (ref 5–15)
BUN: 15 mg/dL (ref 6–20)
BUN: 16 mg/dL (ref 6–20)
CO2: 24 mmol/L (ref 22–32)
CO2: 25 mmol/L (ref 22–32)
Calcium: 8.5 mg/dL — ABNORMAL LOW (ref 8.9–10.3)
Calcium: 8.7 mg/dL — ABNORMAL LOW (ref 8.9–10.3)
Chloride: 101 mmol/L (ref 98–111)
Chloride: 99 mmol/L (ref 98–111)
Creatinine, Ser: 1.79 mg/dL — ABNORMAL HIGH (ref 0.61–1.24)
Creatinine, Ser: 1.88 mg/dL — ABNORMAL HIGH (ref 0.61–1.24)
GFR, Estimated: 49 mL/min — ABNORMAL LOW (ref >60)
GFR, Estimated: 46 mL/min — ABNORMAL LOW (ref >60)
Glucose, Bld: 102 mg/dL — ABNORMAL HIGH (ref 70–99)
Glucose, Bld: 105 mg/dL — ABNORMAL HIGH (ref 70–99)
Potassium: 3.6 mmol/L (ref 3.5–5.1)
Potassium: 4.1 mmol/L (ref 3.5–5.1)
Sodium: 136 mmol/L (ref 135–145)
Sodium: 138 mmol/L (ref 135–145)

## 2023-06-11 LAB — MAGNESIUM: Magnesium: 2 mg/dL (ref 1.7–2.4)

## 2023-06-11 LAB — RAPID URINE DRUG SCREEN, HOSP PERFORMED
Amphetamines: NOT DETECTED
Barbiturates: NOT DETECTED
Benzodiazepines: NOT DETECTED
Cocaine: NOT DETECTED
Opiates: NOT DETECTED
Tetrahydrocannabinol: NOT DETECTED

## 2023-06-11 LAB — LACTIC ACID, PLASMA
Lactic Acid, Venous: 2 mmol/L (ref 0.5–1.9)
Lactic Acid, Venous: 1.1 mmol/L (ref 0.5–1.9)

## 2023-06-11 LAB — MRSA NEXT GEN BY PCR, NASAL: MRSA by PCR Next Gen: NOT DETECTED

## 2023-06-11 MED ORDER — SODIUM CHLORIDE 0.9 % IV SOLN
2.0000 g | INTRAVENOUS | Status: DC
  Administered 2023-06-11 – 2023-06-13 (×3): 2 g via INTRAVENOUS
  Filled 2023-06-11 (×3): qty 20

## 2023-06-11 MED ORDER — POTASSIUM CHLORIDE CRYS ER 20 MEQ PO TBCR
40.0000 meq | EXTENDED_RELEASE_TABLET | Freq: Once | ORAL | Status: AC
  Administered 2023-06-11: 40 meq via ORAL
  Filled 2023-06-11: qty 2

## 2023-06-11 MED ORDER — MAGNESIUM SULFATE 2 GM/50ML IV SOLN
2.0000 g | Freq: Once | INTRAVENOUS | Status: AC
  Administered 2023-06-11: 2 g via INTRAVENOUS
  Filled 2023-06-11: qty 50

## 2023-06-11 MED ORDER — DOXYCYCLINE HYCLATE 100 MG PO TABS
100.0000 mg | ORAL_TABLET | Freq: Two times a day (BID) | ORAL | Status: DC
  Administered 2023-06-11 – 2023-06-14 (×7): 100 mg via ORAL
  Filled 2023-06-11 (×7): qty 1

## 2023-06-11 MED ORDER — POTASSIUM CHLORIDE CRYS ER 20 MEQ PO TBCR
40.0000 meq | EXTENDED_RELEASE_TABLET | ORAL | Status: AC
  Administered 2023-06-11 (×2): 40 meq via ORAL
  Filled 2023-06-11 (×2): qty 2

## 2023-06-11 NOTE — Plan of Care (Signed)
  Problem: Education: Goal: Knowledge of General Education information will improve Description: Including pain rating scale, medication(s)/side effects and non-pharmacologic comfort measures Outcome: Progressing   Problem: Health Behavior/Discharge Planning: Goal: Ability to manage health-related needs will improve Outcome: Progressing   Problem: Clinical Measurements: Goal: Ability to maintain clinical measurements within normal limits will improve Outcome: Progressing Goal: Will remain free from infection Outcome: Progressing Goal: Diagnostic test results will improve Outcome: Progressing Goal: Cardiovascular complication will be avoided Outcome: Progressing   Problem: Activity: Goal: Risk for activity intolerance will decrease Outcome: Progressing   Problem: Nutrition: Goal: Adequate nutrition will be maintained Outcome: Progressing   Problem: Coping: Goal: Level of anxiety will decrease Outcome: Progressing   Problem: Elimination: Goal: Will not experience complications related to bowel motility Outcome: Progressing Goal: Will not experience complications related to urinary retention Outcome: Progressing   Problem: Pain Managment: Goal: General experience of comfort will improve and/or be controlled Outcome: Progressing   Problem: Safety: Goal: Ability to remain free from injury will improve Outcome: Progressing   Problem: Skin Integrity: Goal: Risk for impaired skin integrity will decrease Outcome: Progressing   Problem: Clinical Measurements: Goal: Respiratory complications will improve Outcome: Not Progressing

## 2023-06-11 NOTE — Progress Notes (Signed)
 Advanced Heart Failure Rounding Note  Cardiologist: None  Chief Complaint: A/C HFrEF Subjective:    Suspect I&O not accurate. -3L UOP, net -2,5L. Weight down 7lbs.   CVP 8/9 today.   tMax 102.7. On vanc/cefepime WBC 12.2. Lactic acid 2. Procal 0.42  Feels much better today after diuresis yesterday.   Objective:   Weight Range: 124.4 kg Body mass index is 37.2 kg/m.   Vital Signs:   Temp:  [98 F (36.7 C)-102.7 F (39.3 C)] 98 F (36.7 C) (05/30 0754) Pulse Rate:  [95-121] 97 (05/30 0754) Resp:  [18-28] 20 (05/30 0520) BP: (98-130)/(68-95) 103/68 (05/30 0754) SpO2:  [97 %-100 %] 100 % (05/30 0754) Weight:  [124.4 kg-132.9 kg] 124.4 kg (05/30 0520) Last BM Date : 06/10/23  Weight change: Filed Weights   06/10/23 1021 06/10/23 1523 06/11/23 0520  Weight: 132.9 kg 127.8 kg 124.4 kg    Intake/Output:   Intake/Output Summary (Last 24 hours) at 06/11/2023 0807 Last data filed at 06/11/2023 2130 Gross per 24 hour  Intake 503.83 ml  Output 3075 ml  Net -2571.17 ml     CVP 8/9 Physical Exam  General:  well appearing.  No respiratory difficulty Neck: supple. JVD ~9 cm.  Cor: PMI nondisplaced. Regular rate & rhythm. No rubs, gallops or murmurs. Lungs: clear Extremities: no cyanosis, clubbing, rash, +1 BLE edema  Neuro: alert & oriented x 3. Moves all 4 extremities w/o difficulty. Affect pleasant.   Telemetry   ST low 100s (Personally reviewed)    EKG    No new EKG to review  Labs    CBC Recent Labs    06/10/23 1024  WBC 10.2  HGB 11.7*  HCT 36.2*  MCV 89.8  PLT 304   Basic Metabolic Panel Recent Labs    86/57/84 1026 06/11/23 0229  NA 137 136  K 4.4 3.6  CL 103 101  CO2 21* 24  GLUCOSE 112* 102*  BUN 15 15  CREATININE 1.53* 1.79*  CALCIUM 9.2 8.5*   Liver Function Tests Recent Labs    06/10/23 1026  AST 25  ALT 25  ALKPHOS 44  BILITOT 1.6*  PROT 7.5  ALBUMIN 3.5   No results for input(s): "LIPASE", "AMYLASE" in the last 72  hours. Cardiac Enzymes No results for input(s): "CKTOTAL", "CKMB", "CKMBINDEX", "TROPONINI" in the last 72 hours.  BNP: BNP (last 3 results) Recent Labs    05/13/23 1126 05/18/23 0949 06/10/23 1024  BNP 1,632.8* 1,010.5* 1,504.0*    ProBNP (last 3 results) No results for input(s): "PROBNP" in the last 8760 hours.   D-Dimer No results for input(s): "DDIMER" in the last 72 hours. Hemoglobin A1C No results for input(s): "HGBA1C" in the last 72 hours. Fasting Lipid Panel No results for input(s): "CHOL", "HDL", "LDLCALC", "TRIG", "CHOLHDL", "LDLDIRECT" in the last 72 hours. Thyroid Function Tests No results for input(s): "TSH", "T4TOTAL", "T3FREE", "THYROIDAB" in the last 72 hours.  Invalid input(s): "FREET3"  Other results:   Imaging    DG Chest 2 View Result Date: 06/10/2023 CLINICAL DATA:  Shortness of  breath. EXAM: CHEST - 2 VIEW COMPARISON:  03/04/2023. FINDINGS: The cardio pericardial silhouette is enlarged. There is pulmonary vascular congestion without overt pulmonary edema. Patchy airspace disease at the right base is new in the interval. No substantial pleural effusion. No acute bony abnormality. IMPRESSION: 1. Enlargement of the cardiopericardial silhouette with pulmonary vascular congestion. 2. Patchy airspace disease at the right base, new in the interval. Imaging features could  be related to atelectasis or developing infiltrate. Electronically Signed   By: Donnal Fusi M.D.   On: 06/10/2023 11:44   US  EKG SITE RITE Result Date: 06/10/2023 If Site Rite image not attached, placement could not be confirmed due to current cardiac rhythm.    Medications:     Scheduled Medications:  apixaban   5 mg Oral BID   Chlorhexidine Gluconate Cloth  6 each Topical Daily   digoxin   0.125 mg Oral Daily   empagliflozin   10 mg Oral Daily   furosemide   80 mg Intravenous BID   losartan   25 mg Oral Daily   sodium chloride  flush  10-40 mL Intracatheter Q12H   sodium chloride   flush  3 mL Intravenous Q12H   spironolactone   25 mg Oral Daily    Infusions:  sodium chloride      piperacillin-tazobactam (ZOSYN)  IV 3.375 g (06/11/23 0545)   vancomycin      PRN Medications: sodium chloride , acetaminophen , sodium chloride  flush, sodium chloride  flush    Patient Profile   Mr. Jividen is a 38 y.o. with obesity, tobacco abuse and systolic heart failure due to NICM. Admitted with   Assessment/Plan   1. Acute on Chronic Systolic Heart Failure, NYHA IIIb-IV - due to acute myopericarditis/NICM  - Echo (3/23): EF 25% - R/LHC (3/23): normal cors and well compensated filling pressures. - cMRI (3/23): LVEF 15% with markedly dilated LV, RVEF 33%, with evidence of diffuse myopericarditis. Viral panel (-) - Echo (8/23) EF 20-25%, moderate LVH, RV low/normal. - Echo (12/23): EF 20-25 RV mildly down  - Echo 2/25: EF<20%, RV moderately reduced, ? thrombus - NYHA Class IIIb-IV, Volume overloaded on exam,  ReDs 49%. Failed escalation of home diuretic regimen x 2.  - Volume improving with diuresis and CVP improving. Will continue IV diuresis with 80 lasix  BID through today.  - Check CMP and Lactic acid. Concern for possible low output. May need inotropes if difficulty diuresing  - Continue losartan  25 mg daily. (He is unable to provide POI for Entresto  application) - Continue spironolactone  25 mg daily. - Continue Jardiance  10 mg daily.  - Continue digoxin  0.125 mg daily. Dig level 0.4 06/10/23 - Off beta blocker 2/25 with low output - Not a candidate for advanced therapies or ICD until he can abstain from substances and demonstrate better compliance - w/ h/o myopericarditis, concern that he could be developing restrictive physiology. Will need RHC during admission    2. LV thrombus - Recent echo appears to have possible LV clot - Continue Eliquis  5 mg bid    3. H/o Substance abuse  - Past UDS + for cocaine and fentanyl . He denies any further use   - Denies further ETOH  use, no tobacco in 3 months  - check UDS     4. CKD II/IIIa - Baseline SCr 1.2-1.4 - on SGLT2i, Jardiance  10 mg daily   - SCr 1.79 today, continue to follow with diuresis.    5. Snoring - Concern for OSA - sleep study has been ordered, not completed yet  - will need to schedule post hospital d/c    6. H/o PVCs - non observed on EKG today  - monitor on hospital tele  - Needs sleep study as above   7. Fever - PNA?  Resp panel (-) - Currently on vanc/cefepime. MRSA (-). Switch abx to rocephin - WBC 12.2, tMax 102.7 - Blood cultures pending - Procal 0.42  Length of Stay: 1  Sheryl Donna, NP  06/11/2023, 8:07 AM  Advanced Heart Failure Team Pager [phone] (M-F; 7a - 5p)  Please contact [facility] for night-coverage after hours (5p -7a ) and weekends on [email or web address]
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
## 2023-06-12 LAB — BASIC METABOLIC PANEL WITH GFR
Anion gap: 10 (ref 5–15)
BUN: 14 mg/dL (ref 6–20)
CO2: 24 mmol/L (ref 22–32)
Calcium: 8.3 mg/dL — ABNORMAL LOW (ref 8.9–10.3)
Chloride: 103 mmol/L (ref 98–111)
Creatinine, Ser: 1.76 mg/dL — ABNORMAL HIGH (ref 0.61–1.24)
GFR, Estimated: 50 mL/min — ABNORMAL LOW (ref >60)
Glucose, Bld: 100 mg/dL — ABNORMAL HIGH (ref 70–99)
Potassium: 3.9 mmol/L (ref 3.5–5.1)
Sodium: 137 mmol/L (ref 135–145)

## 2023-06-12 LAB — CBC
HCT: 33 % — ABNORMAL LOW (ref 39.0–52.0)
Hemoglobin: 11.3 g/dL — ABNORMAL LOW (ref 13.0–17.0)
MCH: 29.4 pg (ref 26.0–34.0)
MCHC: 34.2 g/dL (ref 30.0–36.0)
MCV: 85.7 fL (ref 80.0–100.0)
Platelets: 310 10*3/uL (ref 150–400)
RBC: 3.85 MIL/uL — ABNORMAL LOW (ref 4.22–5.81)
RDW: 13.9 % (ref 11.5–15.5)
WBC: 12 10*3/uL — ABNORMAL HIGH (ref 4.0–10.5)
nRBC: 0 % (ref 0.0–0.2)

## 2023-06-12 LAB — COOXEMETRY PANEL
Carboxyhemoglobin: 1.4 % (ref 0.5–1.5)
Methemoglobin: <0.7 % (ref 0.0–1.5)
O2 Saturation: 53 %
Total hemoglobin: 12 g/dL (ref 12.0–16.0)

## 2023-06-12 LAB — MAGNESIUM: Magnesium: 2.6 mg/dL — ABNORMAL HIGH (ref 1.7–2.4)

## 2023-06-12 LAB — C-REACTIVE PROTEIN: CRP: 15.6 mg/dL — ABNORMAL HIGH (ref <1.0)

## 2023-06-12 LAB — SEDIMENTATION RATE: Sed Rate: 56 mm/h — ABNORMAL HIGH (ref 0–16)

## 2023-06-12 NOTE — Plan of Care (Signed)

## 2023-06-12 NOTE — Progress Notes (Signed)
 Advanced Heart Failure Rounding Note  Cardiologist: None  Chief Complaint: A/C HFrEF Subjective:    Suspect I&O not accurate. -3L UOP, net -2,5L. Weight down 7lbs.   - CVP 11 - 4.5L urine output yesterday, 2.2L thus far today - Feels well, no acute events overnight.   Objective:   Weight Range: 123.8 kg Body mass index is 37.02 kg/m.   Vital Signs:   Temp:  [97.6 F (36.4 C)-99.7 F (37.6 C)] 98.8 F (37.1 C) (05/31 1210) Pulse Rate:  [86-103] 88 (05/31 1210) Resp:  [20] 20 (05/31 1210) BP: (94-108)/(69-76) 103/69 (05/31 1210) SpO2:  [94 %-100 %] 96 % (05/31 1210) Weight:  [123.8 kg] 123.8 kg (05/31 0356) Last BM Date : 06/11/23  Weight change: Filed Weights   06/10/23 1523 06/11/23 0520 06/12/23 0356  Weight: 127.8 kg 124.4 kg 123.8 kg    Intake/Output:   Intake/Output Summary (Last 24 hours) at 06/12/2023 1634 Last data filed at 06/12/2023 1219 Gross per 24 hour  Intake 3 ml  Output 4200 ml  Net -4197 ml     CVP  Physical Exam  General:  NAD Neck: supple. JVP 10cm Cor: RRR Lungs: CTA Extremities: no cyanosis, clubbing, rash, 1+ edema Neuro: alert & oriented x 3. Moves all 4 extremities w/o difficulty. Affect pleasant.   Telemetry   ST low 100s (Personally reviewed)    EKG    No new EKG to review  Labs    CBC Recent Labs    06/11/23 0945 06/12/23 0532  WBC 12.2* 12.0*  HGB 11.7* 11.3*  HCT 34.7* 33.0*  MCV 87.2 85.7  PLT 318 310   Basic Metabolic Panel Recent Labs    40/98/11 0830 06/11/23 1511 06/12/23 0532  NA  --  138 137  K  --  4.1 3.9  CL  --  99 103  CO2  --  25 24  GLUCOSE  --  105* 100*  BUN  --  16 14  CREATININE  --  1.88* 1.76*  CALCIUM  --  8.7* 8.3*  MG 2.0  --  2.6*   Liver Function Tests Recent Labs    06/10/23 1026  AST 25  ALT 25  ALKPHOS 44  BILITOT 1.6*  PROT 7.5  ALBUMIN 3.5   No results for input(s): "LIPASE", "AMYLASE" in the last 72 hours. Cardiac Enzymes No results for input(s):  "CKTOTAL", "CKMB", "CKMBINDEX", "TROPONINI" in the last 72 hours.  BNP: BNP (last 3 results) Recent Labs    05/13/23 1126 05/18/23 0949 06/10/23 1024  BNP 1,632.8* 1,010.5* 1,504.0*      Imaging    No results found.    Medications:     Scheduled Medications:  apixaban   5 mg Oral BID   Chlorhexidine  Gluconate Cloth  6 each Topical Daily   digoxin   0.125 mg Oral Daily   doxycycline   100 mg Oral Q12H   empagliflozin   10 mg Oral Daily   furosemide   80 mg Intravenous BID   losartan   25 mg Oral Daily   sodium chloride  flush  10-40 mL Intracatheter Q12H   sodium chloride  flush  3 mL Intravenous Q12H   spironolactone   25 mg Oral Daily    Infusions:  cefTRIAXone  (ROCEPHIN )  IV 2 g (06/11/23 1639)    PRN Medications: acetaminophen , sodium chloride  flush, sodium chloride  flush    Patient Profile   Robert Daniels is a 38 y.o. with obesity, tobacco abuse and systolic heart failure due to NICM. Admitted with  Assessment/Plan   1. Acute on Chronic Systolic Heart Failure, NYHA IIIb-IV - due to acute myopericarditis/NICM  - Echo (3/23): EF 25% - R/LHC (3/23): normal cors and well compensated filling pressures. - cMRI (3/23): LVEF 15% with markedly dilated LV, RVEF 33%, with evidence of diffuse myopericarditis. Viral panel (-) - Echo (8/23) EF 20-25%, moderate LVH, RV low/normal. - Echo (12/23): EF 20-25 RV mildly down  - Echo 2/25: EF<20%, RV moderately reduced, ? thrombus - NYHA Class IIIb-IV, Volume overloaded on exam,  ReDs 49%. Failed escalation of home diuretic regimen x 2.  - Significant urine output over the past 24h; so far 6L.  - Continue IV lasix  80mg  BID - Continue losartan  25mg  daily.  - Continue spironolactone  25 mg daily. - Continue Jardiance  10 mg daily.  - Continue digoxin  0.125 mg daily. Dig level 0.4 06/10/23 - Off beta blocker 2/25 with low output - Not a candidate for advanced therapies or ICD until he can abstain from substances and demonstrate  better compliance - w/ h/o myopericarditis, concern that he could be developing restrictive physiology. Will need RHC during admission    2. LV thrombus - Recent echo appears to have possible LV clot - Continue Eliquis  5 mg bid    3. H/o Substance abuse  - Past UDS + for cocaine and fentanyl . He denies any further use   - Denies further ETOH use, no tobacco in 3 months  - check UDS     4. CKD II/IIIa - Baseline SCr 1.2-1.4 - on SGLT2i, Jardiance  10 mg daily   - SCr 1.76 from 1.88.    5. Snoring - Concern for OSA - sleep study has been ordered, not completed yet  - will need to schedule post hospital d/c    6. H/o PVCs - non observed on EKG today  - monitor on hospital tele  - Needs sleep study as above   7. Fever - PNA?  Resp panel (-) - Currently on vanc/cefepime. MRSA (-). Switch abx to rocephin  - WBC 12.2, tMax 102.7 - Blood cultures pending - Procal 0.42  Length of Stay: 2  Kanesha Cadle, DO  06/12/2023, 4:34 PM  Advanced Heart Failure Team Pager [phone] (M-F; 7a - 5p)  Please contact [facility] for night-coverage after hours (5p -7a ) and weekends on [email or web address]

## 2023-06-13 LAB — BASIC METABOLIC PANEL WITH GFR
Anion gap: 11 (ref 5–15)
BUN: 15 mg/dL (ref 6–20)
CO2: 25 mmol/L (ref 22–32)
Calcium: 9 mg/dL (ref 8.9–10.3)
Chloride: 103 mmol/L (ref 98–111)
Creatinine, Ser: 1.49 mg/dL — ABNORMAL HIGH (ref 0.61–1.24)
GFR, Estimated: >60 mL/min (ref >60)
Glucose, Bld: 104 mg/dL — ABNORMAL HIGH (ref 70–99)
Potassium: 3.6 mmol/L (ref 3.5–5.1)
Sodium: 139 mmol/L (ref 135–145)

## 2023-06-13 LAB — CBC
HCT: 37.6 % — ABNORMAL LOW (ref 39.0–52.0)
Hemoglobin: 12.3 g/dL — ABNORMAL LOW (ref 13.0–17.0)
MCH: 28.6 pg (ref 26.0–34.0)
MCHC: 32.7 g/dL (ref 30.0–36.0)
MCV: 87.4 fL (ref 80.0–100.0)
Platelets: 351 10*3/uL (ref 150–400)
RBC: 4.3 MIL/uL (ref 4.22–5.81)
RDW: 13.6 % (ref 11.5–15.5)
WBC: 9.6 10*3/uL (ref 4.0–10.5)
nRBC: 0 % (ref 0.0–0.2)

## 2023-06-13 LAB — COOXEMETRY PANEL
Carboxyhemoglobin: 0.9 % (ref 0.5–1.5)
Methemoglobin: <0.7 % (ref 0.0–1.5)
O2 Saturation: 48.7 %
Total hemoglobin: 12.7 g/dL (ref 12.0–16.0)

## 2023-06-13 LAB — MAGNESIUM: Magnesium: 2.5 mg/dL — ABNORMAL HIGH (ref 1.7–2.4)

## 2023-06-13 NOTE — Plan of Care (Signed)
   Problem: Education: Goal: Knowledge of General Education information will improve Description Including pain rating scale, medication(s)/side effects and non-pharmacologic comfort measures Outcome: Progressing

## 2023-06-13 NOTE — Progress Notes (Signed)
 Advanced Heart Failure Rounding Note  Cardiologist: None  Chief Complaint: A/C HFrEF Subjective:     - CVP 8 - 1.6L urine output yesterday  - Feels better  Objective:   Weight Range: 122.4 kg Body mass index is 36.61 kg/m.   Vital Signs:   Temp:  [97.7 F (36.5 C)-99.1 F (37.3 C)] 98.6 F (37 C) (06/01 1213) Pulse Rate:  [85-99] 89 (06/01 0716) Resp:  [18-22] 18 (06/01 0716) BP: (98-104)/(72-84) 98/72 (06/01 0716) SpO2:  [98 %-100 %] 100 % (06/01 0716) Weight:  [122.4 kg] 122.4 kg (06/01 0138) Last BM Date : 06/13/23  Weight change: Filed Weights   06/11/23 0520 06/12/23 0356 06/13/23 0138  Weight: 124.4 kg 123.8 kg 122.4 kg    Intake/Output:   Intake/Output Summary (Last 24 hours) at 06/13/2023 1540 Last data filed at 06/13/2023 1346 Gross per 24 hour  Intake 1197 ml  Output 3150 ml  Net -1953 ml     CVP 8 Physical Exam  General:  NAD Neck: JVP 8-9 Cor: RRR Lungs: CTA Extremities: no cyanosis, clubbing, rash, 1+ edema Neuro: alert & oriented x 3. Moves all 4 extremities w/o difficulty. Affect pleasant.   Telemetry   ST low 100s (Personally reviewed)    EKG    No new EKG to review  Labs    CBC Recent Labs    06/12/23 0532 06/13/23 0200  WBC 12.0* 9.6  HGB 11.3* 12.3*  HCT 33.0* 37.6*  MCV 85.7 87.4  PLT 310 351   Basic Metabolic Panel Recent Labs    25/95/63 0532 06/13/23 0200  NA 137 139  K 3.9 3.6  CL 103 103  CO2 24 25  GLUCOSE 100* 104*  BUN 14 15  CREATININE 1.76* 1.49*  CALCIUM 8.3* 9.0  MG 2.6* 2.5*   Liver Function Tests No results for input(s): "AST", "ALT", "ALKPHOS", "BILITOT", "PROT", "ALBUMIN" in the last 72 hours.  No results for input(s): "LIPASE", "AMYLASE" in the last 72 hours. Cardiac Enzymes No results for input(s): "CKTOTAL", "CKMB", "CKMBINDEX", "TROPONINI" in the last 72 hours.  BNP: BNP (last 3 results) Recent Labs    05/13/23 1126 05/18/23 0949 06/10/23 1024  BNP 1,632.8* 1,010.5*  1,504.0*      Imaging    No results found.    Medications:     Scheduled Medications:  apixaban   5 mg Oral BID   Chlorhexidine  Gluconate Cloth  6 each Topical Daily   digoxin   0.125 mg Oral Daily   doxycycline   100 mg Oral Q12H   empagliflozin   10 mg Oral Daily   furosemide   80 mg Intravenous BID   losartan   25 mg Oral Daily   sodium chloride  flush  10-40 mL Intracatheter Q12H   sodium chloride  flush  3 mL Intravenous Q12H   spironolactone   25 mg Oral Daily    Infusions:  cefTRIAXone  (ROCEPHIN )  IV 2 g (06/12/23 1705)    PRN Medications: acetaminophen , sodium chloride  flush, sodium chloride  flush    Patient Profile   Robert Daniels is a 38 y.o. with obesity, tobacco abuse and systolic heart failure due to NICM. Admitted with   Assessment/Plan   1. Acute on Chronic Systolic Heart Failure, NYHA IIIb-IV - due to acute myopericarditis/NICM  - Echo (3/23): EF 25% - R/LHC (3/23): normal cors and well compensated filling pressures. - cMRI (3/23): LVEF 15% with markedly dilated LV, RVEF 33%, with evidence of diffuse myopericarditis. Viral panel (-) - Echo (8/23) EF 20-25%, moderate LVH,  RV low/normal. - Echo (12/23): EF 20-25 RV mildly down  - Echo 2/25: EF<20%, RV moderately reduced, ? thrombus - NYHA Class IIIb-IV, Volume overloaded on exam,  ReDs 49%. Failed esc - 1.6L urine output yesterday - Continue IV lasix  80mg  BID; transition to PO tomorrow.  - Continue losartan  25mg  daily.  - Continue spironolactone  25 mg daily. - Continue Jardiance  10 mg daily.  - Continue digoxin  0.125 mg daily. Dig level 0.4 06/10/23 - Off beta blocker 2/25 with low output - Not a candidate for advanced therapies or ICD until he can abstain from substances and demonstrate better compliance - w/ h/o myopericarditis, concern that he could be developing restrictive physiology.  - Plan on RHC tomorrow.    2. LV thrombus - Recent echo appears to have possible LV clot - Continue Eliquis  5  mg bid    3. H/o Substance abuse  - Past UDS + for cocaine and fentanyl . He denies any further use   - Denies further ETOH use, no tobacco in 3 months  - check UDS     4. CKD II/IIIa - Baseline SCr 1.2-1.4 - on SGLT2i, Jardiance  10 mg daily   - SCr 1.49   5. Snoring - Concern for OSA - sleep study has been ordered, not completed yet  - will need to schedule post hospital d/c    6. H/o PVCs - non observed on EKG today  - monitor on hospital tele  - Needs sleep study as above   7. Fever - PNA?  Resp panel (-) - Currently on vanc/cefepime. MRSA (-). Switch abx to rocephin  - WBC 12.2, tMax 102.7 - Blood cultures pending - Procal 0.42  Length of Stay: 3  Robert Tomassetti, DO  06/13/2023, 3:40 PM  Advanced Heart Failure Team Pager [phone] (M-F; 7a - 5p)  Please contact [facility] for night-coverage after hours (5p -7a ) and weekends on [email or web address]

## 2023-06-14 ENCOUNTER — Encounter (HOSPITAL_COMMUNITY)
Admission: EM | Disposition: A | Discharge status: Home / Self Care | Payer: Self-pay | Source: Home / Self Care | Attending: Cardiology

## 2023-06-14 ENCOUNTER — Other Ambulatory Visit (HOSPITAL_COMMUNITY): Payer: Self-pay

## 2023-06-14 ENCOUNTER — Encounter (HOSPITAL_COMMUNITY): Payer: Self-pay | Admitting: Cardiology

## 2023-06-14 HISTORY — PX: RIGHT HEART CATH: CATH118263

## 2023-06-14 LAB — BASIC METABOLIC PANEL WITH GFR
Anion gap: 8 (ref 5–15)
BUN: 14 mg/dL (ref 6–20)
CO2: 23 mmol/L (ref 22–32)
Calcium: 8.8 mg/dL — ABNORMAL LOW (ref 8.9–10.3)
Chloride: 103 mmol/L (ref 98–111)
Creatinine, Ser: 1.52 mg/dL — ABNORMAL HIGH (ref 0.61–1.24)
GFR, Estimated: 60 mL/min — ABNORMAL LOW (ref >60)
Glucose, Bld: 93 mg/dL (ref 70–99)
Potassium: 3.5 mmol/L (ref 3.5–5.1)
Sodium: 134 mmol/L — ABNORMAL LOW (ref 135–145)

## 2023-06-14 LAB — POCT I-STAT EG7
Acid-Base Excess: 1 mmol/L (ref 0.0–2.0)
Acid-Base Excess: 1 mmol/L (ref 0.0–2.0)
Bicarbonate: 25.8 mmol/L (ref 20.0–28.0)
Bicarbonate: 25.1 mmol/L (ref 20.0–28.0)
Calcium, Ion: 1.24 mmol/L (ref 1.15–1.40)
Calcium, Ion: 1.23 mmol/L (ref 1.15–1.40)
HCT: 40 % (ref 39.0–52.0)
HCT: 39 % (ref 39.0–52.0)
Hemoglobin: 13.6 g/dL (ref 13.0–17.0)
Hemoglobin: 13.3 g/dL (ref 13.0–17.0)
O2 Saturation: 60 %
O2 Saturation: 60 %
Potassium: 3.7 mmol/L (ref 3.5–5.1)
Potassium: 3.7 mmol/L (ref 3.5–5.1)
Sodium: 139 mmol/L (ref 135–145)
Sodium: 139 mmol/L (ref 135–145)
TCO2: 27 mmol/L (ref 22–32)
TCO2: 26 mmol/L (ref 22–32)
pCO2, Ven: 39.9 mmHg — ABNORMAL LOW (ref 44–60)
pCO2, Ven: 38.8 mmHg — ABNORMAL LOW (ref 44–60)
pH, Ven: 7.419 (ref 7.25–7.43)
pH, Ven: 7.419 (ref 7.25–7.43)
pO2, Ven: 31 mmHg — CL (ref 32–45)
pO2, Ven: 31 mmHg — CL (ref 32–45)

## 2023-06-14 LAB — CBC
HCT: 38 % — ABNORMAL LOW (ref 39.0–52.0)
Hemoglobin: 12.7 g/dL — ABNORMAL LOW (ref 13.0–17.0)
MCH: 28.8 pg (ref 26.0–34.0)
MCHC: 33.4 g/dL (ref 30.0–36.0)
MCV: 86.2 fL (ref 80.0–100.0)
Platelets: 368 10*3/uL (ref 150–400)
RBC: 4.41 MIL/uL (ref 4.22–5.81)
RDW: 13.4 % (ref 11.5–15.5)
WBC: 9.3 10*3/uL (ref 4.0–10.5)
nRBC: 0 % (ref 0.0–0.2)

## 2023-06-14 LAB — COOXEMETRY PANEL
Carboxyhemoglobin: 1.2 % (ref 0.5–1.5)
Methemoglobin: <0.7 % (ref 0.0–1.5)
O2 Saturation: 58.9 %
Total hemoglobin: 13.4 g/dL (ref 12.0–16.0)

## 2023-06-14 LAB — MAGNESIUM: Magnesium: 2.2 mg/dL (ref 1.7–2.4)

## 2023-06-14 SURGERY — RIGHT HEART CATH
Anesthesia: LOCAL

## 2023-06-14 MED ORDER — LIDOCAINE HCL (PF) 1 % IJ SOLN
INTRAMUSCULAR | Status: DC | PRN
  Administered 2023-06-14: 2 mL

## 2023-06-14 MED ORDER — LIDOCAINE HCL (PF) 1 % IJ SOLN
INTRAMUSCULAR | Status: AC
  Filled 2023-06-14: qty 30

## 2023-06-14 MED ORDER — SODIUM CHLORIDE 0.9 % IV SOLN: INTRAVENOUS | Status: DC

## 2023-06-14 MED ORDER — POTASSIUM CHLORIDE CRYS ER 20 MEQ PO TBCR
40.0000 meq | EXTENDED_RELEASE_TABLET | Freq: Once | ORAL | Status: AC
  Administered 2023-06-14: 40 meq via ORAL
  Filled 2023-06-14: qty 2

## 2023-06-14 MED ORDER — AMOXICILLIN-POT CLAVULANATE 875-125 MG PO TABS
1.0000 | ORAL_TABLET | Freq: Two times a day (BID) | ORAL | 0 refills | Status: DC
  Filled 2023-06-14: qty 2, 1d supply, fill #0

## 2023-06-14 MED ORDER — AMOXICILLIN-POT CLAVULANATE 875-125 MG PO TABS
1.0000 | ORAL_TABLET | Freq: Two times a day (BID) | ORAL | Status: DC
  Administered 2023-06-14: 1 via ORAL
  Filled 2023-06-14 (×2): qty 1

## 2023-06-14 MED ORDER — ASPIRIN 81 MG PO CHEW: 81.0000 mg | CHEWABLE_TABLET | ORAL | Status: DC

## 2023-06-14 MED ORDER — HEPARIN (PORCINE) IN NACL 1000-0.9 UT/500ML-% IV SOLN
INTRAVENOUS | Status: DC | PRN
  Administered 2023-06-14: 500 mL via SURGICAL_CAVITY

## 2023-06-14 MED ORDER — TORSEMIDE 20 MG PO TABS
80.0000 mg | ORAL_TABLET | Freq: Every day | ORAL | 6 refills | Status: DC
  Filled 2023-06-14: qty 120, 30d supply, fill #0

## 2023-06-14 MED ORDER — FUROSEMIDE 10 MG/ML IJ SOLN
80.0000 mg | Freq: Once | INTRAMUSCULAR | Status: AC
  Administered 2023-06-14: 80 mg via INTRAVENOUS
  Filled 2023-06-14: qty 8

## 2023-06-14 SURGICAL SUPPLY — 6 items
CATH SWAN GANZ 7F STRAIGHT (CATHETERS) IMPLANT
GLIDESHEATH SLENDER 7FR .021G (SHEATH) IMPLANT
GUIDEWIRE .025 260CM (WIRE) IMPLANT
PACK CARDIAC CATHETERIZATION (CUSTOM PROCEDURE TRAY) ×2 IMPLANT
TRANSDUCER W/STOPCOCK (MISCELLANEOUS) IMPLANT
TUBING ART PRESS 72 MALE/FEM (TUBING) IMPLANT

## 2023-06-14 NOTE — Discharge Summary (Signed)
 Advanced Heart Failure Team  Discharge Summary   Patient ID: Robert Daniels MRN: [id number], DOB/AGE: 1985/12/03 38 y.o. Admit date: 06/10/2023 D/C date:     06/14/2023   Primary Discharge Diagnoses:  1. Acute on Chronic Systolic Heart Failure, NYHA IIIb-IV 2. LV thrombus 3. H/O Substance abuse  4. CKD IIIa  5. Snoring 6. H/O  PVCs 7. Fever  Hospital Course:   Robert Daniels is a 38 y.o. with obesity, tobacco abuse and systolic heart failure due to NICM.   Admitted with A/C HFrEF. Failed escalating outpatient diuretics. Diuresed with IV lasix  and transitioned to torsemide  80 mg daily. Overall diuresed 23 pounds. He was not placed on bb due to low output. GDMT included Arb, MRA, SGLT2i.   Once optimized, RHC performed  RA 6 , PCWP 24 and index 1.9. Will need to start advanced therapies work up. Treated suspected pneumonia with IV antibiotics. Converted to a oral antibiotics.   He will continue to be followed closely in the HF clinic. Dr Bruce Caper evaluated and deemed stable for d/c.     See below for detailed problem list.   1. Acute on Chronic Systolic Heart Failure, NYHA IIIb-IV - due to acute myopericarditis/NICM  - Echo (3/23): EF 25% - R/LHC (3/23): normal cors and well compensated filling pressures. - cMRI (3/23): LVEF 15% with markedly dilated LV, RVEF 33%, with evidence of diffuse myopericarditis. Viral panel (-) - Echo (8/23) EF 20-25%, moderate LVH, RV low/normal. - Echo (12/23): EF 20-25 RV mildly down  - Echo 2/25: EF<20%, RV moderately reduced, ? thrombus - Admitted NYHA Class IIIb-IV and Reds 49%.  - CO-OX 59%.  -- Continue losartan  25mg  daily.  - Continue spironolactone  25 mg daily. - Continue Jardiance  10 mg daily.  - Continue digoxin  0.125 mg daily. Dig level 0.4 06/10/23 - Off beta blocker 2/25 with low output -  w/ h/o myopericarditis, concern that he could be developing restrictive physiology.    2. LV thrombus - Recent echo ? LV thrombus but with severely  reduced EF  - Continue Eliquis  5 mg bid    3. H/o Substance abuse  - Past UDS + for cocaine and fentanyl .  - Denies further ETOH use, no tobacco in 3 months  - UDS  this admission negative.    4. CKD II/IIIa - Baseline SCr 1.2-1.4 - on SGLT2i, Jardiance  10 mg daily   -Renal function stable.    5. Snoring - Concern for OSA - sleep study has been ordered, not completed yet  - will need to schedule post hospital d/c    6. H/o PVCs -Rare PVCs.  - Needs sleep study as above   7. Fever - PNA?  Resp panel (-) - Currently on vanc/cefepime. MRSA (-). Switch abx to augmentin  -Afebrile.  WBC down to 9.3   Blood cultures- No growth  - Procal 0.42  Discharge Weigh: 269 pounds  Discharge Vitals: Blood pressure 106/78, pulse (!) 102, temperature 98.8 F (37.1 C), temperature source Oral, resp. rate 20, height 6' (1.829 m), weight 122.3 kg, SpO2 98%.  Labs: Lab Results  Component Value Date   WBC 9.3 06/14/2023   HGB 13.6 06/14/2023   HCT 40.0 06/14/2023   MCV 86.2 06/14/2023   PLT 368 06/14/2023    Recent Labs  Lab 06/10/23 1026 06/11/23 0229 06/14/23 0507 06/14/23 1048  NA 137   < > 134* 139  K 4.4   < > 3.5 3.7  CL 103   < > 103  --  CO2 21*   < > 23  --   BUN 15   < > 14  --   CREATININE 1.53*   < > 1.52*  --   CALCIUM 9.2   < > 8.8*  --   PROT 7.5  --   --   --   BILITOT 1.6*  --   --   --   ALKPHOS 44  --   --   --   ALT 25  --   --   --   AST 25  --   --   --   GLUCOSE 112*   < > 93  --    < > = values in this interval not displayed.   Lab Results  Component Value Date   CHOL 120 03/19/2021   HDL 42 03/19/2021   LDLCALC 51 03/19/2021   TRIG 133 03/19/2021   BNP (last 3 results) Recent Labs    05/13/23 1126 05/18/23 0949 06/10/23 1024  BNP 1,632.8* 1,010.5* 1,504.0*    ProBNP (last 3 results) No results for input(s): "PROBNP" in the last 8760 hours.   Diagnostic Studies/Procedures   CARDIAC CATHETERIZATION Result Date:  06/14/2023 HEMODYNAMICS: Significant respiratory variation, mean values used RA:   6 mmHg (mean) RV:   46/7-10 mmHg PA:   44/25 mmHg (33 mean) PCWP:  24 mmHg (mean)    Estimated Fick CO/CI   4.9 L/min, 2 L/min/m2 Thermodilution CO/CI   4.75 L/min, 1.97 L/min/m2    TPG    9  mmHg     PVR     1.9 Wood Units PAPi      3.2  IMPRESSION: Moderately elevated post capillary filling pressures. Severely reduced cardiac output & index. Normal PVR.    Discharge Medications   Allergies as of 06/14/2023   No Known Allergies      Medication List     TAKE these medications    acetaminophen  500 MG tablet Commonly known as: TYLENOL  Take 500 mg by mouth every 6 (six) hours as needed for fever, headache or mild pain (pain score 1-3).   amoxicillin -clavulanate 875-125 MG tablet Commonly known as: AUGMENTIN  Take 1 tablet by mouth every 12 (twelve) hours.   apixaban  5 MG Tabs tablet Commonly known as: ELIQUIS  Take 1 tablet (5 mg total) by mouth 2 (two) times daily.   digoxin  0.125 MG tablet Commonly known as: LANOXIN  Take 1 tablet (0.125 mg total) by mouth daily.   empagliflozin  10 MG Tabs tablet Commonly known as: Jardiance  Take 1 tablet (10 mg total) by mouth daily before breakfast.   losartan  25 MG tablet Commonly known as: COZAAR  Take 1 tablet (25 mg total) by mouth daily.   potassium chloride  SA 20 MEQ tablet Commonly known as: KLOR-CON  M Take 3 tablets (60 mEq total) by mouth every morning AND 2 tablets (40 mEq total) every evening.   spironolactone  25 MG tablet Commonly known as: ALDACTONE  Take 1 tablet (25 mg total) by mouth daily.   torsemide  20 MG tablet Commonly known as: DEMADEX  Take 4 tablets (80 mg total) by mouth daily. What changed:  how much to take when to take this        Disposition   The patient will be discharged in stable condition to home. Discharge Instructions     (HEART FAILURE PATIENTS) Call MD:  Anytime you have any of the following symptoms: 1) 3  pound weight gain in 24 hours or 5 pounds in 1 week 2) shortness of  breath, with or without a dry hacking cough 3) swelling in the hands, feet or stomach 4) if you have to sleep on extra pillows at night in order to breathe.   Complete by: As directed    Diet - low sodium heart healthy   Complete by: As directed    Heart Failure patients record your daily weight using the same scale at the same time of day   Complete by: As directed    Increase activity slowly   Complete by: As directed        Follow-up Information     Senaida Dama, NP. Go in 10 day(s).   Specialty: Nurse Practitioner Why: Hospital follow up appointment scheduled for Monday, June 21, 2023 at 10:00 AM. PLEASE ARRIVE 10-15 minutes early.  PLEASE call to cancel/reschedule if you CANNOT make appointment. Contact information: [address] [place] [address] [phone]         [facility] Follow up on 06/21/2023.   Specialty: Cardiology Why: at 8:30 Contact information: [address] [place]  [phone]                  Duration of Discharge Encounter:15 minutes  Signed, Mykael Batz NP-C  06/14/2023, 11:48 AM

## 2023-06-14 NOTE — Progress Notes (Addendum)
 Advanced Heart Failure Rounding Note  Cardiologist: None  Chief Complaint: A/C HFrEF Subjective:   CVP 2-3   Denies SOB.   Objective:   Weight Range: 122.3 kg Body mass index is 36.58 kg/m.   Vital Signs:   Temp:  [97.5 F (36.4 C)-98.9 F (37.2 C)] 97.5 F (36.4 C) (06/02 0435) Pulse Rate:  [89-101] 89 (06/02 0435) Resp:  [18-20] 20 (06/02 0435) BP: (98-107)/(63-76) 104/69 (06/02 0435) SpO2:  [98 %-100 %] 100 % (06/02 0435) Weight:  [122.3 kg] 122.3 kg (06/02 0435) Last BM Date : 06/13/23  Weight change: Filed Weights   06/12/23 0356 06/13/23 0138 06/14/23 0435  Weight: 123.8 kg 122.4 kg 122.3 kg    Intake/Output:   Intake/Output Summary (Last 24 hours) at 06/14/2023 0705 Last data filed at 06/14/2023 0437 Gross per 24 hour  Intake 937 ml  Output 2750 ml  Net -1813 ml     CVP 2-3  Physical Exam  General:   No resp difficulty Neck: supple. no JVD.  Cor: PMI nondisplaced. Regular rate & rhythm. No rubs, gallops or murmurs. Lungs: clear Abdomen: soft, nontender, nondistended.  Extremities: no cyanosis, clubbing, rash, edema. RUE PICC  Neuro: alert & oriented x3  Telemetry  SR 90s   EKG    No new EKG to review  Labs    CBC Recent Labs    06/13/23 0200 06/14/23 0507  WBC 9.6 9.3  HGB 12.3* 12.7*  HCT 37.6* 38.0*  MCV 87.4 86.2  PLT 351 368   Basic Metabolic Panel Recent Labs    16/10/96 0200 06/14/23 0507  NA 139 134*  K 3.6 3.5  CL 103 103  CO2 25 23  GLUCOSE 104* 93  BUN 15 14  CREATININE 1.49* 1.52*  CALCIUM 9.0 8.8*  MG 2.5* 2.2   Liver Function Tests No results for input(s): "AST", "ALT", "ALKPHOS", "BILITOT", "PROT", "ALBUMIN" in the last 72 hours.  No results for input(s): "LIPASE", "AMYLASE" in the last 72 hours. Cardiac Enzymes No results for input(s): "CKTOTAL", "CKMB", "CKMBINDEX", "TROPONINI" in the last 72 hours.  BNP: BNP (last 3 results) Recent Labs    05/13/23 1126 05/18/23 0949 06/10/23 1024  BNP  1,632.8* 1,010.5* 1,504.0*      Imaging    No results found.    Medications:     Scheduled Medications:  apixaban   5 mg Oral BID   Chlorhexidine  Gluconate Cloth  6 each Topical Daily   digoxin   0.125 mg Oral Daily   doxycycline   100 mg Oral Q12H   empagliflozin   10 mg Oral Daily   furosemide   80 mg Intravenous BID   losartan   25 mg Oral Daily   sodium chloride  flush  10-40 mL Intracatheter Q12H   sodium chloride  flush  3 mL Intravenous Q12H   spironolactone   25 mg Oral Daily    Infusions:  cefTRIAXone  (ROCEPHIN )  IV 2 g (06/13/23 1644)    PRN Medications: acetaminophen , sodium chloride  flush, sodium chloride  flush    Patient Profile   Robert Daniels is a 38 y.o. with obesity, tobacco abuse and systolic heart failure due to NICM. Admitted with A/C HFrEF.   Assessment/Plan   1. Acute on Chronic Systolic Heart Failure, NYHA IIIb-IV - due to acute myopericarditis/NICM  - Echo (3/23): EF 25% - R/LHC (3/23): normal cors and well compensated filling pressures. - cMRI (3/23): LVEF 15% with markedly dilated LV, RVEF 33%, with evidence of diffuse myopericarditis. Viral panel (-) - Echo (8/23) EF  20-25%, moderate LVH, RV low/normal. - Echo (12/23): EF 20-25 RV mildly down  - Echo 2/25: EF<20%, RV moderately reduced, ? thrombus - NYHA Class IIIb-IV, On admit Reds 49%.  - CO-OX 59%. Appears euvolemic. CVP 2-3. Stop IV lasix . Tomorrow start torsemide  80 mg daily.  - BP soft. No room to up titrate.  -- Continue losartan  25mg  daily.  - Continue spironolactone  25 mg daily. - Continue Jardiance  10 mg daily.  - Continue digoxin  0.125 mg daily. Dig level 0.4 06/10/23 - Off beta blocker 2/25 with low output - Not a candidate for advanced therapies or ICD until he can abstain from substances and demonstrate better compliance - w/ h/o myopericarditis, concern that he could be developing restrictive physiology.    2. LV thrombus - Recent echo ? LV thrombus but with severely  reduced EF  - Continue Eliquis  5 mg bid    3. H/o Substance abuse  - Past UDS + for cocaine and fentanyl .  - Denies further ETOH use, no tobacco in 3 months  - check UDS     4. CKD II/IIIa - Baseline SCr 1.2-1.4 - on SGLT2i, Jardiance  10 mg daily   -Renal function stable.    5. Snoring - Concern for OSA - sleep study has been ordered, not completed yet  - will need to schedule post hospital d/c    6. H/o PVCs -Rare PVCs.  - Needs sleep study as above   7. Fever - PNA?  Resp panel (-) - Currently on vanc/cefepime. MRSA (-). Switch abx to augmentin  -Afebrile.  WBC down to 9.3   Blood cultures- No growth  - Procal 0.42  Possible cath today.  Ambulate.   Length of Stay: 4  Robert Apgar, NP  06/14/2023, 7:05 AM  Advanced Heart Failure Team Pager [phone] (M-F; 7a - 5p)  Please contact [facility] for night-coverage after hours (5p -7a ) and weekends on [email or web address]

## 2023-06-14 NOTE — Plan of Care (Signed)
  Problem: Education: Goal: Knowledge of General Education information will improve Description: Including pain rating scale, medication(s)/side effects and non-pharmacologic comfort measures Outcome: Progressing   Problem: Clinical Measurements: Goal: Will remain free from infection Outcome: Progressing Goal: Respiratory complications will improve Outcome: Progressing   Problem: Activity: Goal: Risk for activity intolerance will decrease Outcome: Progressing   Problem: Elimination: Goal: Will not experience complications related to urinary retention Outcome: Progressing   Problem: Pain Managment: Goal: General experience of comfort will improve and/or be controlled Outcome: Progressing   Problem: Skin Integrity: Goal: Risk for impaired skin integrity will decrease Outcome: Progressing

## 2023-06-14 NOTE — TOC Transition Note (Signed)
 Transition of Care [facility]) - Discharge Note   Patient Details  Name: Robert Daniels MRN: [id number] Date of Birth: 06-24-1985  Transition [facility]) CM/SW Contact:  Ernst Heap Phone Number: [phone] 06/14/2023, 12:36 PM   Clinical Narrative:   HF CSW met with patient at bedside. Patient stated that he will be transporting himself home today at dc.   CSW explained that the patients hospital follow up appointment was scheduled for Monday, June 21, 2023 at 10:00 AM. Patient agreed that this time works for him.       Barriers to Discharge: Continued Medical Work up   Patient Goals and CMS Choice            Discharge Placement                       Discharge Plan and Services Additional resources added to the After Visit Summary for                                       Social Drivers of Health (SDOH) Interventions SDOH Screenings   Food Insecurity: Patient Declined (06/10/2023)  Housing: Unknown (06/10/2023)  Transportation Needs: No Transportation Needs (06/10/2023)  Utilities: Not At Risk (06/10/2023)  Depression (PHQ2-9): Low Risk  (04/16/2021)  Financial Resource Strain: High Risk (03/21/2021)  Social Connections: Unknown (06/10/2023)  Tobacco Use: Medium Risk (06/10/2023)     Readmission Risk Interventions     No data to display

## 2023-06-15 ENCOUNTER — Ambulatory Visit: Payer: Self-pay | Admitting: Family

## 2023-06-15 ENCOUNTER — Telehealth: Payer: Self-pay

## 2023-06-15 LAB — CULTURE, BLOOD (ROUTINE X 2)
Culture: NO GROWTH
Culture: NO GROWTH
Special Requests: ADEQUATE
Special Requests: ADEQUATE

## 2023-06-15 NOTE — Transitions of Care (Post Inpatient/ED Visit) (Signed)
   06/15/2023  Name: KIWAN GADSDEN MRN: [id number] DOB: 04/11/1985  Today's TOC FU Call Status: Today's TOC FU Call Status:: Successful TOC FU Call Completed TOC FU Call Complete Date: 06/15/23 Patient's Name and Date of Birth confirmed.  Transition Care Management Follow-up Telephone Call Date of Discharge: 06/14/23 Discharge Facility: [facility]) Type of Discharge: Inpatient Admission Primary Inpatient Discharge Diagnosis:: acute on chronic systolic CHF How have you been since you were released from the hospital?: Better Any questions or concerns?: No  Items Reviewed: Did you receive and understand the discharge instructions provided?: Yes Medications obtained,verified, and reconciled?: No Medications Not Reviewed Reasons:: Other: (he was not at home at the time of this call and did not have the list with him.  he stated he has all of the medications and did not have any questions about the med regime) Any new allergies since your discharge?: No Dietary orders reviewed?: Yes Type of Diet Ordered:: heart healhy, low sodium Do you have support at home?: Yes  Medications Reviewed Today: Medications Reviewed Today   Medications were not reviewed in this encounter     Home Care and Equipment/Supplies: Were Home Health Services Ordered?: No Any new equipment or medical supplies ordered?: No  Functional Questionnaire: Do you need assistance with bathing/showering or dressing?: No (He said he has a scale at home and was weighing himself daily but not keeping a log of the weghts. He said he is just keeping the information in his head) Do you need assistance with meal preparation?: No Do you need assistance with eating?: No Do you have difficulty maintaining continence: No Do you need assistance with getting out of bed/getting out of a chair/moving?: No Do you have difficulty managing or taking your medications?: No  Follow up appointments reviewed: PCP Follow-up appointment  confirmed?: Yes Date of PCP follow-up appointment?: 06/21/23 Follow-up Provider: Lavona Pounds, NP.   She is listed as his PCP but she has never seen him, Specialist Hospital Follow-up appointment confirmed?: Yes Date of Specialist follow-up appointment?: 06/21/23 Follow-Up Specialty Provider:: [facility].  He said he is not aware of that appointment. I explained to him the importance of keeping it and he said he will check his papers when he gets home.  The appointment is listed on the AVS.  I told him that it is the same day as the appointment at [facility] and he should confirm that he can make both appointments the same day. Do you need transportation to your follow-up appointment?: No Do you understand care options if your condition(s) worsen?: Yes-patient verbalized understanding    SIGNATURE Burnett Carson, RN

## 2023-06-21 ENCOUNTER — Other Ambulatory Visit (HOSPITAL_COMMUNITY): Payer: Self-pay

## 2023-06-21 ENCOUNTER — Encounter: Payer: Self-pay | Admitting: Family

## 2023-06-21 ENCOUNTER — Encounter (HOSPITAL_COMMUNITY): Payer: Self-pay | Admitting: Cardiology

## 2023-06-21 ENCOUNTER — Ambulatory Visit (INDEPENDENT_AMBULATORY_CARE_PROVIDER_SITE_OTHER): Admitting: Family

## 2023-06-21 ENCOUNTER — Ambulatory Visit (HOSPITAL_COMMUNITY)
Admit: 2023-06-21 | Discharge: 2023-06-21 | Disposition: A | Discharge status: Home / Self Care | Source: Ambulatory Visit | Attending: Cardiology | Admitting: Cardiology

## 2023-06-21 ENCOUNTER — Encounter (HOSPITAL_COMMUNITY)

## 2023-06-21 VITALS — BP 110/80 | HR 112 | Wt 275.0 lb

## 2023-06-21 VITALS — BP 108/74 | HR 100 | Temp 98.3°F | Resp 18 | Ht 72.0 in | Wt 277.2 lb

## 2023-06-21 DIAGNOSIS — I509 Heart failure, unspecified: Secondary | ICD-10-CM | POA: Diagnosis not present

## 2023-06-21 DIAGNOSIS — Z4509 Encounter for adjustment and management of other cardiac device: Secondary | ICD-10-CM | POA: Diagnosis not present

## 2023-06-21 DIAGNOSIS — I493 Ventricular premature depolarization: Secondary | ICD-10-CM | POA: Diagnosis not present

## 2023-06-21 DIAGNOSIS — Z09 Encounter for follow-up examination after completed treatment for conditions other than malignant neoplasm: Secondary | ICD-10-CM

## 2023-06-21 DIAGNOSIS — Z7689 Persons encountering health services in other specified circumstances: Secondary | ICD-10-CM

## 2023-06-21 DIAGNOSIS — I5023 Acute on chronic systolic (congestive) heart failure: Secondary | ICD-10-CM

## 2023-06-21 DIAGNOSIS — Z79899 Other long term (current) drug therapy: Secondary | ICD-10-CM

## 2023-06-21 DIAGNOSIS — N1831 Chronic kidney disease, stage 3a: Secondary | ICD-10-CM

## 2023-06-21 DIAGNOSIS — F1990 Other psychoactive substance use, unspecified, uncomplicated: Secondary | ICD-10-CM | POA: Diagnosis not present

## 2023-06-21 DIAGNOSIS — Z95811 Presence of heart assist device: Secondary | ICD-10-CM | POA: Insufficient documentation

## 2023-06-21 DIAGNOSIS — R0683 Snoring: Secondary | ICD-10-CM

## 2023-06-21 DIAGNOSIS — N182 Chronic kidney disease, stage 2 (mild): Secondary | ICD-10-CM | POA: Diagnosis not present

## 2023-06-21 DIAGNOSIS — Z5181 Encounter for therapeutic drug level monitoring: Secondary | ICD-10-CM

## 2023-06-21 DIAGNOSIS — I513 Intracardiac thrombosis, not elsewhere classified: Secondary | ICD-10-CM

## 2023-06-21 DIAGNOSIS — F1911 Other psychoactive substance abuse, in remission: Secondary | ICD-10-CM

## 2023-06-21 DIAGNOSIS — R509 Fever, unspecified: Secondary | ICD-10-CM

## 2023-06-21 LAB — BRAIN NATRIURETIC PEPTIDE: B Natriuretic Peptide: 673.5 pg/mL — ABNORMAL HIGH (ref 0.0–100.0)

## 2023-06-21 LAB — RAPID URINE DRUG SCREEN, HOSP PERFORMED
Amphetamines: NOT DETECTED
Barbiturates: NOT DETECTED
Benzodiazepines: NOT DETECTED
Cocaine: NOT DETECTED
Opiates: NOT DETECTED
Tetrahydrocannabinol: NOT DETECTED

## 2023-06-21 LAB — BASIC METABOLIC PANEL WITH GFR
Anion gap: 9 (ref 5–15)
BUN: 13 mg/dL (ref 6–20)
CO2: 24 mmol/L (ref 22–32)
Calcium: 8.8 mg/dL — ABNORMAL LOW (ref 8.9–10.3)
Chloride: 105 mmol/L (ref 98–111)
Creatinine, Ser: 1.34 mg/dL — ABNORMAL HIGH (ref 0.61–1.24)
GFR, Estimated: >60 mL/min (ref >60)
Glucose, Bld: 90 mg/dL (ref 70–99)
Potassium: 3.6 mmol/L (ref 3.5–5.1)
Sodium: 138 mmol/L (ref 135–145)

## 2023-06-21 LAB — DIGOXIN LEVEL: Digoxin Level: <0.4 ng/mL — ABNORMAL LOW (ref 0.8–2.0)

## 2023-06-21 MED ORDER — TORSEMIDE 20 MG PO TABS
60.0000 mg | ORAL_TABLET | Freq: Every day | ORAL | 6 refills | Status: DC
Start: 2023-06-21 — End: 2023-07-20
  Filled 2023-06-21: qty 120, 40d supply, fill #0

## 2023-06-21 MED ORDER — METOPROLOL TARTRATE 25 MG PO TABS
12.5000 mg | ORAL_TABLET | Freq: Every day | ORAL | 3 refills | Status: DC
  Filled 2023-06-21: qty 45, 90d supply, fill #0

## 2023-06-21 MED ORDER — EMPAGLIFLOZIN 10 MG PO TABS
10.0000 mg | ORAL_TABLET | Freq: Every day | ORAL | 3 refills | Status: DC
Start: 2023-06-21 — End: 2023-07-20
  Filled 2023-06-21: qty 90, 90d supply, fill #0
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
## 2023-06-21 NOTE — Progress Notes (Signed)
 Patient presents for hosp d/c f/u in [facility] today. Recent hospitalization with initiation of VAD evaluation.    Pt states he is feeling "like a brand new man" since discharge. Denies lightheadedness, dizziness, shortness of breath, and signs of bleeding. States he can walk further distances, and chase his kids around without getting winded. States he has quit smoking, drinking, and using recreational drugs. UDS during recent hospitalization was negative. UDS collected today.   VAD coordinator briefly introduced VAD today. See documentation below. States he has good family/girlfriend support if VAD needed in the future.   Dr Bruce Caper had long discussion with patient regarding his heart failure. Discussed plan to titrate GDMT for now, and possibly move forward with VAD/transplant discussion in the future. Discussed importance of continued smoking cessation, medication compliance, and coming for follow up appts. Pt verbalized understanding to all the above. Will plan to obtain echo (not at next visit, but the following visit).  Per Dr Bruce Caper start Metoprolol 12.5 mg q HS and Jardiance  10 mg daily. Decrease Torsemide  to 60 mg daily. Prescriptions sent to [facility] per pt request. He verbalized understanding to all medication changes. Advised to call VAD office if he is not feeling well with medication changes or with any questions. Provided with office #. He verbalized understanding.    Vital Signs:  HR: 112 ST BP: 110/80 (90) SPO2: 98%   Weight: 275 lb lb w/o eqt Last weight: 269 lb Home weights: 269 - 279 lbs   Symptom YES NO DETAILS  Angina  x Activity:  Claudication  x How Far:  Syncope  x When:  Stroke  x   Orthopnea  x How many pillows:  PND  x How often:  CPAP   How many hours:  Pedal Edema  x   Abdominal Fullness  x   Nausea / Vomit  x   Diaphoresis  x When:  Shortness of Breath  x Activity:   Palpitations  x When:  ICD shock  x   Bleeding S/S  x    Tea-colored Urine  x   Hospitalizations  x   Emergency Room  x   Other MD     Activity Able to tolerate increased activity. States he can run around with the kids without getting tired.   Fluid Fluid status stable per Dr Bruce Caper  Diet Appetite improved   [facility] EDUCATION NOTE:    VAD educational packet including "Understanding Your Options with Advanced Heart Failure", "[place] Patient Agreement for VAD Evaluation and Potential Implantation" consent, and [employer] "Heartmate 3 Left Ventricular Device (LVAD) Patient Guide",  "[place] HM III Patient Education", "[employer] Mechanical Circulatory Support Program", and "Decision Aids for Left Ventricular Assist Device" reviewed in detail and given to patient for continued reference.    Patient Instructions:  Start Jardiance  10 mg daily Start Metoprolol 12.5 mg (1/2 tablet) daily Decrease Torsemide  to 60 mg (3 tabs) daily Return to clinic in 3 weeks   Paulo Bosworth RN VAD Coordinator  Office: [phone]  24/7 Pager: [phone]

## 2023-06-21 NOTE — Patient Instructions (Addendum)
 Start Jardiance  10 mg daily Start Metoprolol 12.5 mg (1/2 tablet) daily Decrease Torsemide  to 60 mg (3 tabs) daily Return to clinic in 3 weeks

## 2023-06-23 ENCOUNTER — Other Ambulatory Visit: Payer: Self-pay

## 2023-06-23 ENCOUNTER — Other Ambulatory Visit (HOSPITAL_COMMUNITY): Payer: Self-pay

## 2023-06-28 NOTE — Progress Notes (Addendum)
 ADVANCED HF CLINIC NOTE  Primary Care: none HF Cardiologist: Dr. Julane Ny  CC: Chronic systolic heart failure  HPI: Robert Daniels is a 38 y.o. 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 Echo EF 20-25%. R/LHC with normal cors, well-compensated filling pressures, EF 25%. cMRI with LVEF 15%, RVEF 33%, findings consistent with myopericarditis. Viral panel negative. Started on GDMT and colchicine . Discharged home, weight 282 lbs.  No-showed for hospital follow up.  Seen in ED 8/23 with a/c CHF. Out of Entresto  for a few months. Diuresed with IV lasix . Echo showed  EF 20-25%, moderate LVH, RV mildly enlarged., LA moderately dilated. AHF consulted, Entresto  restarted and Lasix  20 mg daily continued.   Echo 12/23 EF 20-25%  He was seen in [facility] ED in September with shortness of breath and chest pressure after he had stopped taking his HF medications for a week.  Troponin negative X 2. ProBNP 5500. CTA chest with no PE but did show pulmonary edema. UDS + for cocaine and fentanyl . He diuresed with IV lasix  and felt to be stable for discharge home.  Echo 03/10/23 EF < 20%, ? LV thrombus.  Today presents for posthospital follow-up.  He was admitted in late May 2025 for decompensated heart failure.  Patient was aggressively diuresed with right heart cath demonstrating cardiac output of 2 L/min/m.  Since discharge he has been feeling much better.  Reports no limitations secondary to heart failure.  He is able to walk greater than 100 feet without significant difficulties.  His orthopnea/PND/lower extremity edema has resolved.  He is much more motivated to improve his overall health.  We had a very lengthy discussion regarding his prognosis in the setting of stage D systolic heart failure.  Cardiac Studies - Echo (8/23): EF 20-25%, moderate LVH, RV low/normal. - cMRI (3/23): EF 15% with markedly dilated LV with evidence of diffuse  myopericarditis. RV 33% - R/LHC (3/23): normal cors EF 25% RA = 3. RV = 32/10. PA = 34/20 (26). PCW = 17 Fick cardiac output/index = 6.1/2.5. PVR = 1.5 WU. Ao sat = 94%. PA sat = 68%, 68% - RHC (06/14/23): RA 6, RV 46/10, PA 44/25 with a mean of 33, PCWP 24, cardiac output 2 L/min/m.  Past Medical History:  Diagnosis Date   AKI (acute kidney injury) (HCC)    CHF (congestive heart failure) (HCC)    Gout    Hypertension    Current Outpatient Medications  Medication Sig Dispense Refill   acetaminophen  (TYLENOL ) 500 MG tablet Take 500 mg by mouth every 6 (six) hours as needed for fever, headache or mild pain (pain score 1-3).     apixaban  (ELIQUIS ) 5 MG TABS tablet Take 1 tablet (5 mg total) by mouth 2 (two) times daily. 180 tablet 3   digoxin  (LANOXIN ) 0.125 MG tablet Take 1 tablet (0.125 mg total) by mouth daily. 30 tablet 3   losartan  (COZAAR ) 25 MG tablet Take 1 tablet (25 mg total) by mouth daily. 30 tablet 6   metoprolol  tartrate (LOPRESSOR ) 25 MG tablet Take 0.5 tablets (12.5 mg total) by mouth at bedtime. 45 tablet 3   potassium chloride  SA (KLOR-CON  M) 20 MEQ tablet Take 3 tablets (60 mEq total) by mouth every morning AND 2 tablets (40 mEq total) every evening. 150 tablet 11   spironolactone  (ALDACTONE ) 25 MG tablet Take 1 tablet (25 mg total) by mouth daily. 30 tablet 6   empagliflozin  (  JARDIANCE ) 10 MG TABS tablet Take 1 tablet (10 mg total) by mouth daily before breakfast. 90 tablet 3   torsemide  (DEMADEX ) 20 MG tablet Take 3 tablets (60 mg total) by mouth daily. 120 tablet 6   No current facility-administered medications for this encounter.   No Known Allergies  Social History   Socioeconomic History   Marital status: Single    Spouse name: Not on file   Number of children: Not on file   Years of education: Not on file   Highest education level: Not on file  Occupational History   Not on file  Tobacco Use   Smoking status: Former    Types: Cigars, Cigarettes    Smokeless tobacco: Never  Vaping Use   Vaping status: Never Used  Substance and Sexual Activity   Alcohol use: Yes    Comment: occ   Drug use: Never   Sexual activity: Not on file  Other Topics Concern   Not on file  Social History Narrative   ** Merged History Encounter **       Social Drivers of Health   Financial Resource Strain: High Risk (03/21/2021)   Overall Financial Resource Strain (CARDIA)    Difficulty of Paying Living Expenses: Hard  Food Insecurity: Patient Declined (06/10/2023)   Hunger Vital Sign    Worried About Running Out of Food in the Last Year: Patient declined    Ran Out of Food in the Last Year: Patient declined  Transportation Needs: No Transportation Needs (06/10/2023)   PRAPARE - Administrator, Civil Service (Medical): No    Lack of Transportation (Non-Medical): No  Physical Activity: Insufficiently Active (06/21/2023)   Exercise Vital Sign    Days of Exercise per Week: 4 days    Minutes of Exercise per Session: 30 min  Stress: No Stress Concern Present (06/21/2023)   [employer] of Occupational Health - Occupational Stress Questionnaire    Feeling of Stress : Not at all  Social Connections: Unknown (06/10/2023)   Social Connection and Isolation Panel    Frequency of Communication with Friends and Family: More than three times a week    Frequency of Social Gatherings with Friends and Family: Twice a week    Attends Religious Services: 1 to 4 times per year    Active Member of [employer] or Organizations: No    Attends Banker Meetings: 1 to 4 times per year    Marital Status: Patient declined  Intimate Partner Violence: Not At Risk (06/10/2023)   Humiliation, Afraid, Rape, and Kick questionnaire    Fear of Current or Ex-Partner: No    Emotionally Abused: No    Physically Abused: No    Sexually Abused: No   No family history on file.  BP 110/80 Comment: map 90  Pulse (!) 112   Wt 124.7 kg (275 lb)   SpO2 98%   BMI 37.30  kg/m   Wt Readings from Last 3 Encounters:  06/21/23 124.7 kg (275 lb)  06/21/23 125.7 kg (277 lb 3.2 oz)  06/14/23 122.3 kg (269 lb 11.2 oz)   PHYSICAL EXAM: Vitals:   06/21/23 1424  BP: 110/80  Pulse: (!) 112  SpO2: 98%   GENERAL: NAD Lungs- CTA CARDIAC:  JVP: 6 cm          Normal rate with regular rhythm. no murmur.  Pulses 2+. no edema.  ABDOMEN: Soft, non-tender, non-distended.  EXTREMITIES: Warm and well perfused.  NEUROLOGIC: No obvious FND  ReDs reading: 55 %, abnormal  ECG (personally reviewed): ST with PVCs 111 bpm  ASSESSMENT & PLAN:  1. Acute on chronic Systolic Heart Failure - due to acute myopericarditis/NICM  - Echo (3/23): EF 25% - R/LHC (3/23): normal cors and well compensated filling pressures. - cMRI (3/23): LVEF 15% with markedly dilated LV, RVEF 33%, with evidence of diffuse myopericarditis. Viral panel (-) - Echo (8/23) EF 20-25%, moderate LVH, RV low/normal. - Echo (12/23): EF 20-25 RV mildly down  - Echo 2/25: EF<20%, RV moderately reduced, ? thrombus - NYHA IIb functional class.  Significant improvement after hospitalization. - Continue digoxin  125 mcg daily.  Repeat dig level less than 0.4 today - Continue losartan  25 mg daily - Start Toprol  12.5 mg nightly - Continue spironolactone  25 mg daily - Decrease torsemide  to 60 mg daily - Start Jardiance  10 mg daily - We had a very lengthy discussion on advanced therapies today.  He is 38 years old with an EF of 15% dating back to 2023.  He understands that he has a very poor prognosis if he has recurrent hospitalizations due to medication noncompliance.  He reports that he has stopped using all hard drugs for quite some time now and also has cut back on smoking and drinking alcohol.  We obtained a UDS today which was negative consistent with UDS obtained 2 weeks ago and 2 years ago.  2. LV thrombus - Recent echo appears to have possible LV clot - Continue Eliquis  5 mg bid. CBC today. Reports  compliance.  - No bleeding  3. Substance abuse - Again encouraged complete cessation from ETOH and tobacco (see above) - he is aware that he is not a candidate for ICD or advanced therapies until these i.  - Past UDS + for cocaine and fentanyl ; says he doesn't use cocaine but touches it.  -See above   5. CKD II/IIIa - Baseline SCr 1.2-1.4 - Continue SGLT2i - Serum creatinine 1.34 today from 1.52 on 06/14/2023  6. Snoring - Concern for OSA - Consider sleep study at follow up  7. SDOH - Now has medicaid - He has transportation - Engage HFSW for resources  [place] [employer], DO  10:04 AM  I spent 65 minutes caring for this patient today including face to face time, ordering and reviewing labs, reviewing records from hospitalization noted above, discussing advanced therapies, counseling on med compliance, discussing prognosis of Stage D heart failure, seeing the patient, documenting in the record, and arranging follow ups.

## 2023-07-05 ENCOUNTER — Other Ambulatory Visit: Payer: Self-pay

## 2023-07-08 ENCOUNTER — Other Ambulatory Visit (HOSPITAL_COMMUNITY): Payer: Self-pay

## 2023-07-08 ENCOUNTER — Other Ambulatory Visit: Payer: Self-pay

## 2023-07-12 ENCOUNTER — Encounter (HOSPITAL_COMMUNITY): Admitting: Cardiology

## 2023-07-20 ENCOUNTER — Ambulatory Visit (HOSPITAL_COMMUNITY)
Admission: RE | Admit: 2023-07-20 | Discharge: 2023-07-20 | Disposition: A | Discharge status: Home / Self Care | Source: Ambulatory Visit | Attending: Cardiology | Admitting: Cardiology

## 2023-07-20 ENCOUNTER — Other Ambulatory Visit (HOSPITAL_COMMUNITY): Payer: Self-pay

## 2023-07-20 VITALS — BP 113/85 | HR 113 | Wt 278.6 lb

## 2023-07-20 DIAGNOSIS — Z79899 Other long term (current) drug therapy: Secondary | ICD-10-CM | POA: Insufficient documentation

## 2023-07-20 DIAGNOSIS — Z7901 Long term (current) use of anticoagulants: Secondary | ICD-10-CM | POA: Diagnosis not present

## 2023-07-20 DIAGNOSIS — N182 Chronic kidney disease, stage 2 (mild): Secondary | ICD-10-CM | POA: Diagnosis not present

## 2023-07-20 DIAGNOSIS — R0683 Snoring: Secondary | ICD-10-CM | POA: Insufficient documentation

## 2023-07-20 DIAGNOSIS — E669 Obesity, unspecified: Secondary | ICD-10-CM | POA: Diagnosis not present

## 2023-07-20 DIAGNOSIS — R5383 Other fatigue: Secondary | ICD-10-CM | POA: Diagnosis not present

## 2023-07-20 DIAGNOSIS — I428 Other cardiomyopathies: Secondary | ICD-10-CM | POA: Insufficient documentation

## 2023-07-20 DIAGNOSIS — I513 Intracardiac thrombosis, not elsewhere classified: Secondary | ICD-10-CM

## 2023-07-20 DIAGNOSIS — I5023 Acute on chronic systolic (congestive) heart failure: Secondary | ICD-10-CM | POA: Insufficient documentation

## 2023-07-20 DIAGNOSIS — Z720 Tobacco use: Secondary | ICD-10-CM | POA: Insufficient documentation

## 2023-07-20 DIAGNOSIS — I5022 Chronic systolic (congestive) heart failure: Secondary | ICD-10-CM | POA: Diagnosis not present

## 2023-07-20 DIAGNOSIS — I514 Myocarditis, unspecified: Secondary | ICD-10-CM | POA: Diagnosis not present

## 2023-07-20 DIAGNOSIS — I509 Heart failure, unspecified: Secondary | ICD-10-CM

## 2023-07-20 LAB — BRAIN NATRIURETIC PEPTIDE: B Natriuretic Peptide: 925.3 pg/mL — ABNORMAL HIGH (ref 0.0–100.0)

## 2023-07-20 LAB — BASIC METABOLIC PANEL WITH GFR
Anion gap: 15 (ref 5–15)
BUN: 13 mg/dL (ref 6–20)
CO2: 24 mmol/L (ref 22–32)
Calcium: 8.9 mg/dL (ref 8.9–10.3)
Chloride: 98 mmol/L (ref 98–111)
Creatinine, Ser: 1.39 mg/dL — ABNORMAL HIGH (ref 0.61–1.24)
GFR, Estimated: >60 mL/min (ref >60)
Glucose, Bld: 100 mg/dL — ABNORMAL HIGH (ref 70–99)
Potassium: 3.7 mmol/L (ref 3.5–5.1)
Sodium: 137 mmol/L (ref 135–145)

## 2023-07-20 LAB — DIGOXIN LEVEL: Digoxin Level: <0.4 ng/mL — ABNORMAL LOW (ref 0.8–2.0)

## 2023-07-20 MED ORDER — LOSARTAN POTASSIUM 50 MG PO TABS
50.0000 mg | ORAL_TABLET | Freq: Every day | ORAL | 3 refills | Status: DC
  Filled 2023-07-20: qty 90, 90d supply, fill #0

## 2023-07-20 MED ORDER — METOPROLOL SUCCINATE ER 25 MG PO TB24
25.0000 mg | ORAL_TABLET | Freq: Every day | ORAL | 3 refills | Status: DC
  Filled 2023-07-20: qty 30, 30d supply, fill #0

## 2023-07-20 MED ORDER — APIXABAN 5 MG PO TABS
5.0000 mg | ORAL_TABLET | Freq: Two times a day (BID) | ORAL | 3 refills | Status: AC
  Filled 2023-07-20: qty 180, 90d supply, fill #0

## 2023-07-20 MED ORDER — TORSEMIDE 20 MG PO TABS
80.0000 mg | ORAL_TABLET | Freq: Every day | ORAL | 6 refills | Status: DC
Start: 2023-07-20 — End: 2023-08-20
  Filled 2023-07-20: qty 120, 30d supply, fill #0

## 2023-07-20 MED ORDER — EMPAGLIFLOZIN 10 MG PO TABS
10.0000 mg | ORAL_TABLET | Freq: Every day | ORAL | 3 refills | Status: AC
  Filled 2023-07-20: qty 90, 90d supply, fill #0

## 2023-07-20 MED ORDER — DIGOXIN 125 MCG PO TABS
0.0625 mg | ORAL_TABLET | Freq: Every day | ORAL | 3 refills | Status: AC
  Filled 2023-07-20: qty 30, 60d supply, fill #0

## 2023-07-20 MED ORDER — SPIRONOLACTONE 25 MG PO TABS
25.0000 mg | ORAL_TABLET | Freq: Every day | ORAL | 6 refills | Status: AC
  Filled 2023-07-20: qty 30, 30d supply, fill #0

## 2023-07-20 NOTE — Progress Notes (Signed)
 Patient presents for 3 week f/u in [facility] today.   Pt states he continues to feel well since last visit. Denies lightheadedness, dizziness or signs of bleeding. Pt states he occasionally feels short of breath both at rest and with activity. Torsemide  decreased to 60 mg last visit. Pt states he has continued taking previous dose of 80 mg a day. Pt states home weights have been between 268-273 lbs.   Dr. Gardenia had discussion with patient regarding his heart failure and plans to titrate GDMT. Discussed importance of continued smoking cessation, medication compliance, and coming for follow up appts. Pt states he has continue to refrain from smoking cigarettes. MD offered referral to Cardiac Rehab. Pt refused and stated he can workout on his own. MD advised to start exercising 20-30 minutes a day and take breaks as needed.  Per Dr Gardenia switch to Metoprolol  Succinate 25 mg daily, increase Losartan  to 50 mg daily and decrease Digoxin  to .0625 mg daily. Pt to continue 80 mg of Torsemide  daily. Prescriptions sent to [facility] per pt request. He verbalized understanding to all medication changes. Advised to call AHF Clinic if he is not feeling well with medication changes or with any questions. He verbalized understanding.   BMET, BNP and Digoxin  collected at today's visit.   Vital Signs:  HR: 113 ST BP: 113/85 (95) SPO2: 98%   Weight: 268-275 lb lb  Last weight: 275 lb Home weights: 269 - 279 lbs   Symptom YES NO DETAILS  Angina  x Activity:  Claudication  x How Far:  Syncope  x When:  Stroke  x   Orthopnea x  How many pillows: 1-2 pillows; pt states he cannot lay flat without experiencing shortness of breath  PND  x How often:  CPAP   How many hours:  Pedal Edema  x   Abdominal Fullness  x   Nausea / Vomit  x   Diaphoresis  x When:  Shortness of Breath x  Activity: occasionally at rest and with activity  Palpitations  x When:  ICD shock  x   Bleeding S/S  x    Tea-colored Urine  x   Hospitalizations  x   Emergency Room  x   Other MD     Activity Pt states he has remained active  Fluid   Diet Appetite improved   Patient Instructions:  Decrease Digoxin  to half a tablet daily (.0625mg )  Switch to Metoprolol  succinate 25mg  daily  Increase Losartan  to 50mg  daily Continue 80mg  of Torsemide  daily Return in 1 month to see Dr. Zenaida with an echocardiogram prior to appointment  Schuyler Lunger RN, BSN V[facility] Coordinator 24/7 Pager [phone]

## 2023-07-20 NOTE — Patient Instructions (Addendum)
 Decrease Digoxin  to half a tablet daily (.0625mg )  Switch to Metoprolol  succinate 25mg  daily  Increase Losartan  to 50mg  daily Continue 80mg  of Torsemide  daily Return in 1 month to see Dr. Zenaida with an echocardiogram prior to appointment

## 2023-07-22 ENCOUNTER — Telehealth (HOSPITAL_COMMUNITY): Payer: Self-pay | Admitting: Cardiology

## 2023-07-22 ENCOUNTER — Other Ambulatory Visit (HOSPITAL_COMMUNITY): Payer: Self-pay

## 2023-07-22 DIAGNOSIS — I5022 Chronic systolic (congestive) heart failure: Secondary | ICD-10-CM

## 2023-07-22 NOTE — Progress Notes (Signed)
 ADVANCED HF CLINIC NOTE  Primary Care: none HF Cardiologist: Dr. Cherrie  CC: Chronic systolic heart failure  HPI: [name] is a 38 y.o. with obesity, tobacco abuse and systolic heart failure due to NICM presenting today to follow up. Cardiac hx dates back to 3/23,  TTE EF 20-25%. R/LHC with normal cors, cMRI with LVEF 15%, RVEF 33%, findings consistent with myopericarditis. Viral panel negative.   Lost to follow up unfortunately. He was seen in [place] ED in 9/24 with shortness of breath and chest pressure after he had stopped taking his HF medications for a week.  UDS + for cocaine and fentanyl .   He was admitted in  May 2025 at [facility] for decompensated heart failure.  Patient was aggressively diuresed with right heart cath demonstrating cardiac output of 2 L/min/m.    Presents today for follow up. According to [name], he has been compliant with all medications; he no longer smokes, drinks alcohol; no drug use for months now. UDS has been negative. He is motivated to improve his health. Reports that he is doing fairly well functionally. Performs all ADLs independently. However, his fiance is at bedside today. She feels that he is undermining his symptoms. He is anxious at home about his heart failure; he becomes fatigued with exertion.     Current Outpatient Medications  Medication Sig Dispense Refill   acetaminophen  (TYLENOL ) 500 MG tablet Take 500 mg by mouth every 6 (six) hours as needed for fever, headache or mild pain (pain score 1-3).     losartan  (COZAAR ) 50 MG tablet Take 1 tablet (50 mg total) by mouth daily. 90 tablet 3   metoprolol  succinate (TOPROL  XL) 25 MG 24 hr tablet Take 1 tablet (25 mg total) by mouth daily. 30 tablet 3   potassium chloride  SA (KLOR-CON  M) 20 MEQ tablet Take 3 tablets (60 mEq total) by mouth every morning AND 2 tablets (40 mEq total) every evening. 150 tablet 11   apixaban  (ELIQUIS ) 5 MG TABS tablet Take 1 tablet (5 mg total) by mouth 2  (two) times daily. 180 tablet 3   digoxin  (LANOXIN ) 0.125 MG tablet Take 1/2 tablet (0.0625 mg total) by mouth daily. 30 tablet 3   empagliflozin  (JARDIANCE ) 10 MG TABS tablet Take 1 tablet (10 mg total) by mouth daily before breakfast. 90 tablet 3   spironolactone  (ALDACTONE ) 25 MG tablet Take 1 tablet (25 mg total) by mouth daily. 30 tablet 6   torsemide  (DEMADEX ) 20 MG tablet Take 4 tablets (80 mg total) by mouth daily. 120 tablet 6   No current facility-administered medications for this encounter.    BP 113/85 Comment: 95  Pulse (!) 113   Wt 126.4 kg (278 lb 9.6 oz)   SpO2 98%   BMI 37.78 kg/m   Wt Readings from Last 3 Encounters:  07/20/23 126.4 kg (278 lb 9.6 oz)  06/21/23 124.7 kg (275 lb)  06/21/23 125.7 kg (277 lb 3.2 oz)   PHYSICAL EXAM: Vitals:   07/20/23 1128  BP: 113/85  Pulse: (!) 113  SpO2: 98%   GENERAL: NAD Lungs- normal work of breathing CARDIAC:  JVP: 7 cm          Normal rate with regular rhythm. no murmur.  Pulses 2+. no edema.  ABDOMEN: Soft, non-tender, non-distended.  EXTREMITIES: Warm and well perfused.  NEUROLOGIC: No obvious FND  ECG 06/11/23: sinus tachycardia, personally reviewed   Cardiac Studies Echo (8/23): EF 20-25%, moderate LVH, RV low/normal. cMRI (3/23):  EF 15% with markedly dilated LV with evidence of diffuse myopericarditis. RV 33% R/LHC (3/23): normal cors EF 25% RA = 3. RV = 32/10. PA = 34/20 (26). PCW = 17 Fick cardiac output/index = 6.1/2.5. PVR = 1.5 WU. Ao sat = 94%. PA sat = 68%, 68% RHC (06/14/23): RA 6, RV 46/10, PA 44/25 with a mean of 33, PCWP 24, cardiac output 2 L/min/m.  ASSESSMENT & PLAN:  1. Acute on chronic Systolic Heart Failure - Nonischemic cardiomyopathy. / myocarditis. - Echo (3/23): EF 25% - R/LHC (3/23): normal cors and well compensated filling pressures. - cMRI (3/23): LVEF 15% with markedly dilated LV, RVEF 33%, with evidence of diffuse myopericarditis. Viral panel (-) - Echo (8/23) EF 20-25%,  moderate LVH, RV low/normal. - Echo (12/23): EF 20-25 RV mildly down  - Echo 2/25: EF<20%, RV moderately reduced, ? thrombus - NHYA IIB-III; fiance feels his functional class is worse than he reports to us . Will schedule CPX.  - Decrease digoxin  to 0.0652mcg; plan to D/C at follow up. Obtain dig level.  - Increase losartan  to 50mg  daily. Transition to Entresto  at follow up.  - Increase to metoprolol  succinate 25mg  daily.  - Continue spironolactone  25 mg daily - Previously decreased torsemide  to 60mg  daily; however, he continues to take 80mg  daily.  - continue jardiance  10mg  daily.   2. LV thrombus - Swirling of contrast w/o discrete thrombus on TTE from 2/25. Patient was started on apixaban . Will repeat TTE to reassess. I discussed this with him while inpatient. - Continue Eliquis  5 mg bid.  - Repeat TTE w/ contrast at follow up.   3. Substance abuse - UDS x 2 now negative. Reports complete abstinence.    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   6. Snoring - Concern for OSA - Consider sleep study at follow up  7. SDOH - Now has medicaid - He has transportation - Engage HFSW for resources  I spent 38 minutes caring for this patient today including face to face time, ordering and reviewing labs, reviewing records noted above, discussing advanced therapies (transplant), scheduling CPX, counseling on above, seeing the patient, documenting in the record, and arranging follow ups.   Alexas Basulto, DO  8:48 AM

## 2023-07-22 NOTE — Telephone Encounter (Signed)
 Pts spouse called to request assistance from case manager to complete disability forms   Care Navigation referral placed  Per Marit Meth, LCSW [facility] can assist, will reach out to discuss steps

## 2023-07-23 ENCOUNTER — Telehealth: Payer: Self-pay | Admitting: Licensed Clinical Social Worker

## 2023-07-23 NOTE — Progress Notes (Signed)
 Heart and Vascular Care Navigation  07/23/2023  Robert Daniels 06/25/1985 [id number]  Reason for Referral: disability application assistance Patient is participating in a Managed Medicaid Plan:Yes- [facility]  Engaged with patient by telephone for initial visit for Heart and Vascular Care Coordination.                                                                                                   Assessment:                                     LCSW was able to reach pt today at [phone], called twice as voicemail is not set up. Introduced self, role, reason for call. Confirmed home address, PCP, and emergency contact is his significant other Robert Daniels, confirmed they are not legally married but have been together a long time. Explained why marital status may matter when filing for benefits etc, DPR on file if we cannot reach him. Now has Medicaid Forensic psychologist). 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 He is aware of ROI for [facility] and how they can assist with filing a claim. LCSW mailed this to home address and encouraged him to return it so we can complete referral.   Inquired about difficulty with housing, medications, food and utilities at this time. Pt denied any issues. Has transportation to upcoming appts. No additional questions at this time.SABRA  HRT/VAS Care Coordination     Patients Home Cardiology Office Heart Failure Clinic   Outpatient Care Team Social Worker   Social Worker Name: Robert Daniels, [place], [phone]   Living arrangements for the past 2 months Single Family Home   Lives with: Significant Other   Patient Current Insurance Coverage Medicaid   Patient Has Concern With Paying Medical Bills No   Does Patient Have Prescription Coverage? Yes   Home Assistive Devices/Equipment None       Social History:                                                                              SDOH Screenings   Food Insecurity: No Food Insecurity (07/23/2023)  Housing: Low Risk  (07/23/2023)  Transportation Needs: No Transportation Needs (07/23/2023)  Utilities: Not At Risk (07/23/2023)  Alcohol Screen: Low Risk  (06/21/2023)  Depression (PHQ2-9): Low Risk  (06/21/2023)  Financial Resource Strain: Medium Risk (07/23/2023)  Physical Activity: Insufficiently Active (06/21/2023)  Social Connections: Unknown (06/10/2023)  Stress: No Stress Concern Present (06/21/2023)  Tobacco Use: Medium Risk (06/21/2023)  Health Literacy: Adequate Health Literacy (07/23/2023)    SDOH Interventions: Financial Resources:  Financial Strain Interventions: Other (Comment) (pt denies any  issues with food, housing, transportation etc. does request assistance with disability application) DSS for financial assistance and Social Security for Disability application assistance  Food Insecurity:  Food Insecurity Interventions: Intervention Not Indicated  Housing Insecurity:  Housing Interventions: Intervention Not Indicated  Transportation:   Transportation Interventions: Payor Benefit, Patient Resources (Friends/Family)     Other Care Navigation Interventions:     Inpatient/Outpatient Substance Abuse Counseling/Rehab Options Note negative screens, LCSW team available for resources if needed  Provided Pharmacy assistance resources  Pt denies any issues at this time obtaining or affording his medications   Follow-up plan:   LCSW has mailed pt ROI for [facility] and noted what to complete and sign. I encouraged him to return them to HF clinic when completed. Also included my card as needed, pt aware Jenna out on leave. I will f/u and once received we can submit formal referral.

## 2023-08-02 NOTE — Progress Notes (Signed)
 ------------------------------------------------------------------------------- Attestation signed by Daniels Griffes, MD at 08/03/23 1420 I reviewed the information provided by the resident, examined the patient, and agree with the treatment plan.   Robert Jesus, MD [facility] Internal Medicine  -------------------------------------------------------------------------------  Internal Medicine (MEDW) Progress Note  Assessment & Plan:  Robert Daniels is a 38 y.o. 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   Principal Problem:   Acute on chronic systolic (congestive) heart failure    Active Problems:   HTN (hypertension)   AKI (acute kidney injury)   Thickening of wall of gallbladder    Active Problems  Acute on Chronic HFrEF (15%)  Likely Systemic Venous Congestion  Abdominal Pain Plan to begin PO torsemide  80 mg. Emphasized the importance of taking the therapeutic dose of diuretic once daily, as he was taking torsemide  40 mg BID at home. Patient reports continued abdominal pain which he believes may be due to constipation. Will increase the bowel regimen as below.  - Start torsemide  80 mg daily.  - Home GDMT - Continue empagliflozin  10mg  daily, losartan  50mg  daily, spironolactone  25 mg  daily. - Hold home metoprolol  succinate 25 mg per cardiology             - Hold home digoxin  0.0625 mg  - Strict I/Os - Daily standing weights  Constipation Patient having increased abdominal pain, possibly due to persistent constipation as above. Will add enema and increase bowel regimen to elicit bowel movement.  - Increase miralax to 17g BID and senna 2 tablets BID - Enema ordered   AKI on CKD2 Creatinine of 1.71 on admission, from 1.39 on 07/20/2023. Today Cr is 1.55. Suspect  that this may be near patient's new baseline. Will evaluate response to continued diuresis, as above. - Daily BMP   Nausea, Vomiting (Resolved)  Likely 2/2 Gastroenteritis Nausea and vomiting likely iso food poisoning. Patient denies n/v, however had trouble keeping down food overnight. Has been able to tolerate PO intake since then. - Advance diet as tolerated - Daily CBC  Iron Deficiency Anemia Hgb on admission 10.4, baseline unclear but appears to be ~12. Iron 31, TIBC 284, Iron sat 11, ferritin 138.0. Hgb stable at 10.7 today and patient remains HDS. No signs of overt bleeding. CTM. - Daily CBC - Consider IV iron infusion   Chronic Problems   Hx of LV thrombus Continue home apixaban  5 mg BID   Prior hx of substance abuse UDS negative   Checklist: Diet: Regular Diet DVT PPx: Patient Already on Full Anticoagulation with apixaban  Code Status: Full Code Dispo: Patient appropriate for Observation based on expectation at time of admission that period of observation will last less than two midnights   Team Contact Information:  Primary Team: Internal Medicine (MEDW) Primary Resident: Susann Son, MD Resident's Pager: [phone] (Gen MedW Intern - Tower)  Interval History:  Patient endorses persistent epigastric abdominal pain, which he believes is not only due to fluid. Has not had a BM in last few days, patient suspects this is contributing to the pain as he usually has regular stools daily.   Objective:  Temp:  [36.7 C (98.1 F)-37 C (98.6 F)] 37 C (98.6 F) Pulse:  [93-109] 109 Resp:  [18-20] 18 BP: (105-122)/(77-92) 117/91 SpO2:  [94 %-100 %] 100 %  Gen: NAD,  converses  HENT: atraumatic, normocephalic Heart: RRR, S3 present Lungs: CTAB, no crackles or wheezes Abdomen: soft, tender to palpation in epigastric region, distended Extremities: trace edema in BLE  Susann Son, MD Internal Medicine, PGY-1

## 2023-08-03 ENCOUNTER — Telehealth (HOSPITAL_BASED_OUTPATIENT_CLINIC_OR_DEPARTMENT_OTHER): Payer: Self-pay | Admitting: Licensed Clinical Social Worker

## 2023-08-03 NOTE — Telephone Encounter (Signed)
 H&V Care Navigation CSW Progress Note  Clinical Social Worker contacted patient by phone to f/u on release form for [employer] disability assistance. Reached pt at [phone], he shares he was recently hospitalized in [place] and hasn't been home to check mail. He will check as soon as he can and let me know if anything has been received. Pt was advised to complete and return to HF clinic as able.  Patient is participating in a Managed Medicaid Plan:  Yes- [facility]  SDOH Screenings   Food Insecurity: No Food Insecurity (08/02/2023)   Received from [facility]  Housing: Low Risk  (07/23/2023)  Transportation Needs: No Transportation Needs (08/02/2023)   [facility] from [facility]  Utilities: Not At Risk (07/23/2023)  Alcohol Screen: Low Risk  (06/21/2023)  Depression (PHQ2-9): Low Risk  (06/21/2023)  Financial Resource Strain: Low Risk  (08/02/2023)   Received from [facility]  Recent Concern: Financial Resource Strain - Medium Risk (07/23/2023)  Physical Activity: Insufficiently Active (06/21/2023)  Social Connections: Unknown (06/10/2023)  Stress: No Stress Concern Present (06/21/2023)  Tobacco Use: Medium Risk (07/31/2023)   Received from [facility]  Health Literacy: Adequate Health Literacy (07/23/2023)    Marit Lark, MSW, LCSW Clinical Social Worker [facility] Heart/Vascular Care Navigation  [phone]- work cell phone (preferred)

## 2023-08-03 NOTE — Care Plan (Signed)
  Problem: Adult Inpatient Plan of Care Goal: Absence of Hospital-Acquired Illness or Injury Intervention: Identify and Manage Fall Risk Recent Flowsheet Documentation Taken 08/03/2023 1600 by Tammy Coy, RN Safety Interventions: . fall reduction program maintained . environmental modification Taken 08/03/2023 1400 by Tammy Coy, RN Safety Interventions: . fall reduction program maintained . environmental modification Taken 08/03/2023 1200 by Tammy Coy, RN Safety Interventions: . fall reduction program maintained . environmental modification Taken 08/03/2023 1000 by Tammy Coy, RN Safety Interventions: . low bed . fall reduction program maintained . environmental modification Taken 08/03/2023 0800 by Tammy Coy, RN Safety Interventions: . fall reduction program maintained . environmental modification Intervention: Prevent Skin Injury Recent Flowsheet Documentation Taken 08/03/2023 0800 by Tammy Coy, RN Positioning for Skin: Supine/Back   Problem: Adult Inpatient Plan of Care Goal: Absence of Hospital-Acquired Illness or Injury Intervention: Identify and Manage Fall Risk Recent Flowsheet Documentation Taken 08/03/2023 1600 by Tammy Coy, RN Safety Interventions: . fall reduction program maintained . environmental modification Taken 08/03/2023 1400 by Tammy Coy, RN Safety Interventions: . fall reduction program maintained . environmental modification Taken 08/03/2023 1200 by Tammy Coy, RN Safety Interventions: . fall reduction program maintained . environmental modification Taken 08/03/2023 1000 by Tammy Coy, RN Safety Interventions: . low bed . fall reduction program maintained . environmental modification Taken 08/03/2023 0800 by Tammy Coy, RN Safety Interventions: . fall reduction program maintained . environmental modification Intervention: Prevent Skin Injury Recent Flowsheet Documentation Taken  08/03/2023 0800 by Tammy Coy, RN Positioning for Skin: Supine/Back

## 2023-08-03 NOTE — Discharge Summary (Signed)
 ------------------------------------------------------------------------------- Attestation signed by Jesus Griffes, MD at 08/04/23 1411 I reviewed the information provided by the resident, examined the patient, and agree with the treatment plan.   [name], MD [facility]  -------------------------------------------------------------------------------   Physician Discharge Summary [facility] OBSERVATION [facility] [address] [place] [address] Dept: [phone] Loc: [phone]   Identifying Information:  Robert Daniels 01-29-1985 [id number]  Primary Care Physician: Pcp, None Per Patient   Code Status: Full Code  Admit Date: 07/30/2023  Discharge Date: 08/03/2023   Discharge To: Home  Discharge Service: [facility] - General Medicine Floor Team (MED LELON GLENWOOD Balls)   Discharge Attending Physician: Griffes Jesus, MD  Discharge Diagnoses:   Principal Problem:   Acute on chronic systolic (congestive) heart failure    (POA: Yes) Active Problems:   HTN (hypertension) (POA: Yes)   AKI (acute kidney injury) (POA: Unknown)   Thickening of wall of gallbladder (POA: Unknown) Resolved Problems:   * No resolved hospital problems. *   Hospital Course:  Robert Daniels is a 38 y.o. 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   Active Problems Nausea, Vomiting  Likely 2/2 Gastroenteritis Patient presented with 2 days of nausea and vomiting after eating fish. Two friends who ate the same fish experienced similar symptoms, so most likely etiology was gastroenteritis. Nausea and vomiting improved with conservative management.  Acute on Chronic HFrEF (15%)  Likely Systemic Venous Congestion  Abdominal Pain Patient presented with  signs of HFrEF exacerbation including pro-BNP of 11,443 (increase from 5,500 10 months ago), increase in weight (increase from , and edema on CXR. Patient reported dyspnea and abdominal pain and endorsed not taking his home diuretics for several days. Patient received repeat TEE that showed EF of 15% unchanged from March 2025. Patient was given IV bumetanide and transitioned to oral torsemide  with symptom improvement. Abdominal pain was likely in the setting of HFrEF due to liver capsular stretch and gut wall edema as evidenced by significant improvement w diuresis. Patient discharged on home regimen of Torsemide  80 daily. Home empagliflozin  10 daily, losartan  50 mg daily, and spironolactone  25 mg were continued. Home metoprolol  succinate 25 mg and digoxin  0.0625 mg was held  were held per cardiology recommendation and restarted on discharge.   AKI on CKD2 Patient presented with AKI with Creatinine of 1.71, increased from baseline of 1.39 on 07/20/2023. Suspected prerenal AKI (consistent with FeUrea 27%) versus vascular congestion. Downtrended with diuresis. On discharge Cr 1.53  Hyperbilirubinemia Total bilirubin was elevated to 2.1 with direct bilirubin at 0.60, suggesting predominantly indirect hyperbilirubinemia. Reticulocyte count was not elevated (2.23%, corrected to 1.85), so hemolysis as etiology unlikely. Consider other etiologies such as Gilbert syndrome.    Iron Deficiency Anemia Hemoglobin at admission was 10.4 decreased from baseline of approximately 12 and was stable during hospitalization. Patient remained hemodynamically stable. Iron studies revealed iron 31, TIBC 284, iron saturation 11%, and ferritin 138. Consider iron supplementation outpatient.   Constipation Patient endorsed constipation during hospitalized and received miralax 17 g and senna 2 tablets daily. Gut edema secondary to heart failure likely contributed to constipation. Patient referred to bowel movements on day of  discharge.   Chronic Problems   Hx of LV thrombus Home apixaban  5 mg BID was continued.  Prior hx of substance abuse Urine drug screen negative on admission.   Outpatient Provider Follow Up Issues:  [] when to restart all home medications  Touchbase with Outpatient Provider: Warm Handoff: Not completed secondary to no PCP on file  Procedures: None ______________________________________________________________________ Discharge Medications:    Your Medication List     CHANGE how you take these medications    digoxin  125 mcg (0.125 mg) tablet Commonly known as: LANOXIN  Take 0.5 tablets (62.5 mcg total) by mouth daily. What changed: how much to take   torsemide  40 mg Tab Take 2 tablets (80 mg total) by mouth daily. What changed:  medication strength how much to take when to take this       CONTINUE taking these medications    apixaban  5 mg Tab Commonly known as: ELIQUIS  Take 1 tablet (5 mg total) by mouth two (2) times a day.   JARDIANCE  10 mg tablet Generic drug: empagliflozin  Take 1 tablet (10 mg total) by mouth daily.   losartan  25 MG tablet Commonly known as: COZAAR  Take 1 tablet (25 mg total) by mouth daily.   metoPROLOL  succinate 25 MG 24 hr tablet Commonly known as: Toprol -XL Take 1 tablet (25 mg total) by mouth daily.   potassium chloride  20 MEQ ER tablet Take 2 tablets (40 mEq total) by mouth two (2) times a day.   spironolactone  25 MG tablet Commonly known as: ALDACTONE  Take 1 tablet (25 mg total) by mouth daily.        Allergies: Patient has no known allergies. ______________________________________________________________________ Pending Test Results:   Most Recent Labs: All lab results last 24 hours -  Recent Results (from the past 24 hours)  Basic Metabolic Panel   Collection Time: 08/02/23 10:27 PM  Result Value Ref Range   Sodium 138 135 - 145 mmol/L   Potassium 4.2 3.5 - 5.1 mmol/L   Chloride 100 98 - 107 mmol/L   CO2  26.0 20.0 - 31.0 mmol/L   Anion Gap 12 5 - 14 mmol/L   BUN 21 9 - 23 mg/dL   Creatinine 8.36 (H) 9.26 - 1.18 mg/dL   BUN/Creatinine Ratio 13    eGFR CKD-EPI (2021) Male 55 (L) >=60 mL/min/1.34m2   Glucose 99 70 - 179 mg/dL   Calcium 9.1 8.7 - 89.5 mg/dL  Magnesium  Level   Collection Time: 08/02/23 10:27 PM  Result Value Ref Range   Magnesium  2.2 1.6 - 2.6 mg/dL  Basic Metabolic Panel   Collection Time: 08/03/23  4:05 AM  Result Value Ref Range   Sodium 138 135 - 145 mmol/L   Potassium 3.4 3.4 - 4.8 mmol/L   Chloride 101 98 - 107 mmol/L   CO2 25.0 20.0 - 31.0 mmol/L   Anion Gap 12 5 - 14 mmol/L   BUN 18 9 - 23 mg/dL   Creatinine 8.46 (H) 9.26 - 1.18 mg/dL   BUN/Creatinine Ratio 12    eGFR CKD-EPI (2021) Male 59 (L) >=60 mL/min/1.58m2   Glucose 96 70 - 179 mg/dL   Calcium 8.8 8.7 - 89.5 mg/dL  CBC   Collection Time: 08/03/23  4:05 AM  Result Value Ref Range   WBC 10.5 3.6 - 11.2 10*9/L   RBC 3.85 (L) 4.26 - 5.60 10*12/L   HGB 11.2 (L) 12.9 - 16.5 g/dL   HCT 67.1 (L) 60.9 - 51.9 %   MCV 85.1 77.6 - 95.7 fL   MCH 29.0 25.9 - 32.4 pg   MCHC 34.1 32.0 - 36.0 g/dL  RDW 16.9 (H) 12.2 - 15.2 %   MPV 7.8 6.8 - 10.7 fL   Platelet 345 150 - 450 10*9/L  Magnesium  Level   Collection Time: 08/03/23  4:05 AM  Result Value Ref Range   Magnesium  2.1 1.6 - 2.6 mg/dL  Hepatic Function Panel   Collection Time: 08/03/23  4:05 AM  Result Value Ref Range   Albumin 3.4 3.4 - 5.0 g/dL   Total Protein 7.4 5.7 - 8.2 g/dL   Total Bilirubin 1.2 0.3 - 1.2 mg/dL   Bilirubin, Direct 9.49 (H) 0.00 - 0.30 mg/dL   AST 22 <=65 U/L   ALT 27 10 - 49 U/L   Alkaline Phosphatase 66 46 - 116 U/L    Relevant Studies/Radiology: XR Abdomen 1 View Result Date: 08/03/2023 EXAM: XR ABDOMEN 1 VIEW DATE: 08/02/2023 8:25 PM ACCESSION: [id number] UN DICTATED: 08/03/2023 8:34 AM INTERPRETATION LOCATION: MAIN CAMPUS CLINICAL INDICATION: 38 years old Male with CONSTIPATION  COMPARISON: Chest radiograph 07/30/2023  TECHNIQUE: Supine view of the abdomen. One view, 2 images FINDINGS: Partially visualized lungs with unchanged cardiomegaly. Nonobstructive bowel gas pattern with large colonic stool burden. Left pelvic small calcification representing phlebolith.   Nonobstructive bowel gas pattern with large colonic stool burden.  Echocardiogram W Colorflow Spectral Doppler With Contrast Result Date: 07/31/2023 Patient Info Name:     Robert Daniels Age:     38 years DOB:     1985-12-23 Gender:     Male MRN:     [id number] Accession #:     [id number] UN Account #:     [id number] Ht:     183 cm Wt:     127 kg BSA:     2.59 m2 BP:     110 /     77 mmHg HR:     101 bpm Exam Date:     07/31/2023 11:15 AM Admit Date:     07/30/2023 Exam Type:     ECHOCARDIOGRAM W COLORFLOW SPECTRAL DOPPLER W CONTRAST Technical Quality:     Fair Staff Sonographer:     Montie Furnish Ordering Physician:     Richardson VEAR Sanes Study Info Indications      - Eval EF ; Definity /Optison  Procedure(s)   Complete two-dimensional, color flow and Doppler transthoracic echocardiogram is performed with contrast to opacify the left ventricle and to improve the delineation of the left ventricle endocardial borders. Ultrasound Enhancing Agent/Agitated Saline ------------------------------ UEA/Ag. Saline:     Optison  Amount:     --- ml IV Access Condition:     patent with no signs of infiltration Summary   1. The left ventricle is severely dilated in size with normal wall thickness.   2. The left ventricular systolic function is severely decreased, LVEF is visually estimated at 15%.   3. Calculated LVOT SV: 34 ml.  CO: 3.5 L/min.  CI: 1.3 L/min/m2.   4. Pulsus alternans is noted.  No LV thrombus noted.   5. There is grade III diastolic dysfunction (severely elevated filling pressure).   6. There is mild mitral valve regurgitation.   7. The left atrium is severely dilated in size.   8. The right ventricle is severely dilated in size, with severely reduced systolic  function.   9. There is moderate pulmonary hypertension.   10. TR maximum velocity: 3.2 m/s  Estimated PASP: 56 mmHg.   11. The right atrium is moderately dilated in size.   12. IVC size and inspiratory change suggest elevated right atrial pressure. (10-20 mmHg). Left  Ventricle   The left ventricle is severely dilated in size with normal wall thickness. The left ventricular systolic function is severely decreased, LVEF is visually estimated at 15%. Pulsus alternans is noted.  No LV thrombus noted. There is grade III diastolic dysfunction (severely elevated filling pressure). Right Ventricle   The right ventricle is severely dilated in size, with severely reduced systolic function. Left Atrium   The left atrium is severely dilated in size. Right Atrium   The right atrium is moderately dilated in size. Aortic Valve   The aortic valve is probably trileaflet with normal appearing leaflets with normal excursion. There is no significant aortic regurgitation. There is no evidence of a significant transvalvular gradient. Mitral Valve   The mitral valve leaflets are normal with reduced leaflet mobility. There is mild mitral valve regurgitation. Tricuspid Valve   The tricuspid valve leaflets are normal, with normal leaflet mobility. There is mild tricuspid regurgitation. There is moderate pulmonary hypertension. TR maximum velocity: 3.2 m/s  Estimated PASP: 56 mmHg. Pulmonic Valve   The pulmonic valve is normal. There is no significant pulmonic regurgitation. There is no evidence of a significant transvalvular gradient. Aorta   The aorta is normal in size in the visualized segments. Inferior Vena Cava   IVC size and inspiratory change suggest elevated right atrial pressure. (10-20 mmHg). Pericardium/Pleural   There is no pericardial effusion. Ventricles ---------------------------------------------------------------------- Name                                 Value        Normal  ---------------------------------------------------------------------- LV Dimensions 2D/MM ----------------------------------------------------------------------  IVS Diastolic Thickness (2D)                                0.8 cm       0.6-1.0 LVID Diastole (2D)                  7.8 cm       4.2-5.8  LVPW Diastolic Thickness (2D)                                0.7 cm       0.6-1.0 LVID Systole (2D)                   7.2 cm       2.5-4.0 LVOT Diameter                       2.4 cm               LV Mass Index (2D Cubed)          106 g/m2        49-115  Relative Wall Thickness (2D)                                  0.18        <=0.42 RV Dimensions 2D/MM ----------------------------------------------------------------------  RV Basal Diastolic Dimension                           6.3 cm       2.5-4.1 TAPSE  1.0 cm         >=1.7 Atria ---------------------------------------------------------------------- Name                                 Value        Normal ---------------------------------------------------------------------- LA Dimensions ---------------------------------------------------------------------- LA Dimension (2D)                   5.8 cm       3.0-4.1 LA Volume Index (4C A-L)        53.04 ml/m2               LA Volume Index (2C A-L)        68.96 ml/m2               LA Volume (BP MOD)                  150 ml               LA Volume Index (BP MOD)        57.94 ml/m2   16.00-34.00 RA Dimensions ---------------------------------------------------------------------- RA Area (4C)                      23.4 cm2        <=18.0 RA Area (4C) Index              9.0 cm2/m2               RA ESV Index (4C MOD)             32 ml/m2         18-32 Left Ventricular Outflow Tract ---------------------------------------------------------------------- Name                                 Value        Normal ---------------------------------------------------------------------- LVOT 2D  ---------------------------------------------------------------------- LVOT Diameter                       2.4 cm               LVOT Area                          4.5 cm2               LVOT Doppler ---------------------------------------------------------------------- LVOT Peak Velocity                 0.5 m/s               LVOT VTI                              8 cm               LVOT Stroke Volume                   34 ml               LVOT SI                           13 ml/m2               LVOT CO  3.5 l/min               LVOT CI                         1.3 L/min/m2 Aortic Valve ---------------------------------------------------------------------- Name                                 Value        Normal ---------------------------------------------------------------------- AV Doppler ---------------------------------------------------------------------- AV Peak Velocity                   0.8 m/s               AV Peak Gradient                    2 mmHg               AV Area (Cont Eq Vel)              3.1 cm2               AV Area Index (Cont Eq Vel)     1.2 cm2/m2               AV DI (Vel)                           0.68 Mitral Valve ---------------------------------------------------------------------- Name                                 Value        Normal ---------------------------------------------------------------------- MV Regurgitation Doppler ---------------------------------------------------------------------- MR Peak Velocity                   3.7 m/s               MV Diastolic Function ---------------------------------------------------------------------- MV E Peak Velocity                109 cm/s               MV Annular TDI ---------------------------------------------------------------------- MV Septal e' Velocity             3.4 cm/s         >=8.0 MV E/e' (Septal)                      32.2               MV Lateral e' Velocity            7.2 cm/s        >=10.0 MV E/e'  (Lateral)                     15.1               MV e' Average                     5.3 cm/s               MV E/e' (Average)                     23.7 Tricuspid Valve ---------------------------------------------------------------------- Name  Value        Normal ---------------------------------------------------------------------- TV Regurgitation Doppler ---------------------------------------------------------------------- TR Peak Velocity                   3.2 m/s               Estimated PAP/RSVP ---------------------------------------------------------------------- RA Pressure                        15 mmHg           <=5 RV Systolic Pressure               56 mmHg           <36 Pulmonic Valve ---------------------------------------------------------------------- Name                                 Value        Normal ---------------------------------------------------------------------- PV Doppler ---------------------------------------------------------------------- PV Peak Velocity                   0.5 m/s Aorta ---------------------------------------------------------------------- Name                                 Value        Normal ---------------------------------------------------------------------- Ascending Aorta ---------------------------------------------------------------------- Ao Root Diameter (2D)               3.4 cm               Ao Root Diam Index (2D)          1.3 cm/m2 Venous ---------------------------------------------------------------------- Name                                 Value        Normal ---------------------------------------------------------------------- IVC/SVC ---------------------------------------------------------------------- IVC Diameter (Exp 2D)               2.9 cm         <=2.1 Report Signatures Finalized by Odell Debby Grew  MD on 07/31/2023 01:38 PM  ECG 12 Lead Result Date: 07/31/2023 NORMAL SINUS RHYTHM LEFT ATRIAL  ABNORMALITY NONSPECIFIC ST-T WAVE ABNORMALITIES WHEN COMPARED WITH ECG OF 30-Jul-2023 09:47, NO SIGNIFICANT CHANGE WAS FOUND Confirmed by Vinie Dunnings [phone]) on 07/31/2023 1:09:33 PM  ECG 12 Lead Result Date: 07/31/2023 NORMAL SINUS RHYTHM POSSIBLE INFERIOR INFARCT (CITED ON OR BEFORE 02-Oct-2022) PROLONGED QT ABNORMAL ECG WHEN COMPARED WITH ECG OF 02-Oct-2022 23:21, NONSPECIFIC T WAVE ABNORMALITY, IMPROVED IN INFERIOR LEADS NONSPECIFIC T WAVE ABNORMALITY NO LONGER EVIDENT IN ANTERIOR LEADS QT HAS LENGTHENED Confirmed by Von Shawl ([id number]) on 07/31/2023 9:09:17 AM  NM Hepatobiliary Result Date: 07/30/2023 EXAM: NM HEPATOBILIARY DATE: 07/30/2023 5:25 PM ACCESSION: [id number] UN DICTATED: 07/30/2023 5:22 PM INTERPRETATION LOCATION: MAIN CAMPUS CLINICAL INDICATION: 38 years old Male: Abdominal pain, nausea, vomiting, PO intolerance, epigastric/RUQ pain, thickened GB wall, hx of CHF (EF 15%)  RADIOPHARMACEUTICAL: Tc-95m mebrofenin (Choletec), 5.5 mCi, TECHNIQUE: Following injection of radiopharmaceutical, dynamic anterior 1 minute images were acquired of the abdomen for 60 minutes.  An additional right lateral static image was obtained at 1 hour minutes. COMPARISON: None FINDINGS: - There is prompt hepatic uptake and excretion of radiotracer - No focal hepatic lesions are identified - Gallbladder activity is noted at 27 minutes - Small bowel activity is noted at 15 minutes -Trace  amount of enteric reflux.   No evidence of acute cholecystitis. Patent common bile duct.  US  RUQ W Gallbladder Result Date: 07/30/2023 EXAM: US  RUQ W GALLBLADDER ACCESSION: [id number] UN REPORT DATE: 07/30/2023 11:48 AM CLINICAL INDICATION: 38 years old with Acute epigastric/RUQ pain  COMPARISON: CTA CHEST W CONTRAST 10/03/2022 TECHNIQUE: Static and cine images of the right upper quadrant were performed. FINDINGS: LIVER: The liver was normal in echogenicity. No focal hepatic lesions. No biliary ductal dilatation.      Liver: 16.2 cm       Common bile duct: 0.32 cm GALLBLADDER: The gallbladder was mildly contracted and this limits evaluation for cholelithiasis. Sonographic Murphy sign was positive.  No pericholecystic fluid. Gallbladder wall thickening measuring 1.2 cm.      Gallbladder wall: 1.2 cm LIMITED RIGHT KIDNEY: No hydronephrosis.   Gallbladder wall thickening without cholelithiasis (for which evaluation is limited due to its contracted state) or biliary ductal dilation. No gallbladder wall thickening was present on the 2024 CT study. This could be secondary to an infectious/inflammatory process or vascular congestion. However, in the setting of a reported positive sonographic Murphy sign, differential includes cholecystitis. Recommend correlation with clinical exam.   XR Chest Portable Result Date: 07/30/2023 EXAM: XR CHEST PORTABLE ACCESSION: [id number] UN REPORT DATE: 07/30/2023 11:21 AM CLINICAL INDICATION: CHEST PAIN  TECHNIQUE: Single View AP Chest Radiograph. COMPARISON: None FINDINGS: Mild hazy opacification of the lower lungs. No pleural effusion or pneumothorax. Cardiac silhouette is enlarged.   Cardiomegaly with lower lung hazy opacification which could represent mild edema.   ______________________________________________________________________ Discharge Instructions:  Activity Instructions     Activity as tolerated        It was a pleasure taking care of you. You were admitted for an acute on chronic heart failure exacerbation thought to be secondary to lost of medication adherence.   We recommend following up with your outpatient cardiology appointment on 08/20/23.   Please take all of your medications as instructed in the discharge instructions. Return to the hospital if you experience new or worsening symptoms of shortness of breath, chest pain, lower extremity swelling or if you see necessary.     Follow Up instructions and Outpatient Referrals    Call MD for:  difficulty breathing, headache or  visual disturbances     Call MD for:  extreme fatigue     Call MD for:  persistent dizziness or light-headedness     Call MD for:  persistent nausea or vomiting     Call MD for:  redness, tenderness, or signs of infection (pain, swelling,  redness, odor or green/yellow discharge around incision site)     Call MD for:  severe uncontrolled pain     Call MD for: Temperature > 38.5 Celsius ( > 101.3 Fahrenheit)     Discharge instructions        ______________________________________________________________________ Discharge Day Services: BP 104/82   Pulse 104   Temp 36.9 C (98.4 F) (Oral)   Resp 16   Ht 182.9 cm (6')   Wt (!) 124.4 kg (274 lb 4 oz)   SpO2 100%   BMI 37.20 kg/m   Pt seen on the day of discharge and determined appropriate for discharge.  Condition at Discharge: good  Length of Discharge: I spent greater than 30 mins in the discharge of this patient.

## 2023-08-04 NOTE — Care Plan (Signed)
 Transition of Care Encounter Data   Call attempt: 1 Admission date: 07/30/23 Discharge date: 08/03/23 Discharge diagnosis: Acute on chronic systolic (congestive) heart failure Patient post discharge: Medications:      SABRA   [facility]: [phone]:  .  [facility]: [phone]:  .  Other: Contact PCP:        [facility] TRANSITIONAL CASE MANAGEMENT SUMMARY NOTE   Attempted to contact patient today at Home to complete Transitional Case Management call from [facility]. Left message for patient to return call; direct phone number included in message left for patient; 1st attempt.          DAWNYETTA L TAYLOR, RN

## 2023-08-05 NOTE — Care Plan (Signed)
 Transition of Care Encounter Data   Call attempt: 2 Admission date: 07/30/23 Discharge date: 08/03/23 Discharge diagnosis: Acute on chronic systolic (congestive) heart failure Patient post discharge: Medications:      SABRA   [facility]: [phone]:  .  [facility]: [phone]:  .  Other: Contact PCP:     [facility] TRANSITIONAL CASE MANAGEMENT SUMMARY NOTE   Attempted to contact patient today at Cell to complete Transitional Case Management call from [facility]. Left message for patient to return call; direct phone number included in message left for patient; 2nd attempt.     LORRIE FORBES OH[facility], RN

## 2023-08-06 NOTE — Care Plan (Signed)
  Care Management Final Transition Planning Assessment    08/03/2023  Patient's Post Acute Contact Information: Thalia Birch (Friend)  [phone]     Has a PCP appointment been made?: No        Post Acute Facility needed at discharge?: No        Home Care/ Home Medical Equipment needed at discharge?: No           Outpatient/Community Referrals needed for discharge?: No       Transportation Anticipated: family or friend will provide    Currently receiving outpatient dialysis?: No       Discharge Disposition: Home w/ Self Care          Patient and/or family were provided with choice of facilities / services that are available and appropriate to meet post hospital care needs?: Other (Comment) (Choice of facilities/services will be provided if deemed medically necessary.)       Final Assessment Complete: Yes                     Readmission Risk Score:  Predictive Model Details        10% (Low)  Factor Value   Calculated 08/03/2023 16:00 22% Number of active inpatient medication orders 18   [facility] Risk of Unplanned Readmission Model 17% ECG/EKG order present in last 6 months   *Archived Data 12% Imaging order present in last 6 months    11% Latest hemoglobin low (11.2 g/dL)    89% Number of ED visits in last six months 1    8% Active anticoagulant inpatient medication order present    8% Latest creatinine high (1.53 mg/dL)    6% Age 38    4% Charlson Comorbidity Index 2    4% Current length of stay 2.004 days

## 2023-08-10 ENCOUNTER — Ambulatory Visit (HOSPITAL_COMMUNITY): Attending: Cardiology

## 2023-08-10 DIAGNOSIS — I110 Hypertensive heart disease with heart failure: Secondary | ICD-10-CM | POA: Insufficient documentation

## 2023-08-10 DIAGNOSIS — I5022 Chronic systolic (congestive) heart failure: Secondary | ICD-10-CM | POA: Diagnosis present

## 2023-08-12 DIAGNOSIS — I5022 Chronic systolic (congestive) heart failure: Secondary | ICD-10-CM | POA: Diagnosis not present

## 2023-08-16 ENCOUNTER — Telehealth: Payer: Self-pay | Admitting: Licensed Clinical Social Worker

## 2023-08-16 NOTE — Telephone Encounter (Signed)
 H&V Care Navigation CSW Progress Note  Clinical Social Worker contacted patient by phone to f/u on ROI for [facility] referral. No answer today x2 at [phone], voicemail not set up. Will re-attempt again as able.  Patient is participating in a Managed Medicaid Plan:  Yes- [employer]  SDOH Screenings   Food Insecurity: No Food Insecurity (08/02/2023)   Received from [facility]  Housing: Low Risk  (07/23/2023)  Transportation Needs: No Transportation Needs (08/02/2023)   Received from [facility]  Utilities: Not At Risk (07/23/2023)  Alcohol Screen: Low Risk  (06/21/2023)  Depression (PHQ2-9): Low Risk  (06/21/2023)  Financial Resource Strain: Low Risk  (08/02/2023)   Received from [facility]  Recent Concern: Financial Resource Strain - Medium Risk (07/23/2023)  Physical Activity: Insufficiently Active (06/21/2023)  Social Connections: Unknown (06/10/2023)  Stress: No Stress Concern Present (06/21/2023)  Tobacco Use: Medium Risk (07/31/2023)   Received from [facility]  Health Literacy: Adequate Health Literacy (07/23/2023)    Marit Lark, MSW, LCSW Clinical Social Worker II [facility] Health Heart/Vascular Care Navigation  [phone]- work cell phone (preferred)

## 2023-08-18 ENCOUNTER — Telehealth: Payer: Self-pay | Admitting: Licensed Clinical Social Worker

## 2023-08-18 NOTE — Telephone Encounter (Signed)
 H&V Care Navigation CSW Progress Note  Clinical Social Worker contacted patient by phone to f/u on ROI for [facility] referral. No answer again today x2 at [phone], voicemail not set up. Will route to colleague at HF clinic should pt return paperwork.    Patient is participating in a Managed Medicaid Plan:  Yes- [employer]  SDOH Screenings   Food Insecurity: No Food Insecurity (08/02/2023)   Received from [facility]  Housing: Low Risk  (07/23/2023)  Transportation Needs: No Transportation Needs (08/02/2023)   Received from [facility]  Utilities: Not At Risk (07/23/2023)  Alcohol Screen: Low Risk  (06/21/2023)  Depression (PHQ2-9): Low Risk  (06/21/2023)  Financial Resource Strain: Low Risk  (08/02/2023)   Received from [facility]  Recent Concern: Financial Resource Strain - Medium Risk (07/23/2023)  Physical Activity: Insufficiently Active (06/21/2023)  Social Connections: Unknown (06/10/2023)  Stress: No Stress Concern Present (06/21/2023)  Tobacco Use: Medium Risk (07/31/2023)   Received from [facility]  Health Literacy: Adequate Health Literacy (07/23/2023)    Marit Lark, MSW, LCSW Clinical Social Worker II [facility] Health Heart/Vascular Care Navigation  [phone]- work cell phone (preferred)

## 2023-08-20 ENCOUNTER — Other Ambulatory Visit (HOSPITAL_COMMUNITY): Payer: Self-pay

## 2023-08-20 ENCOUNTER — Ambulatory Visit (HOSPITAL_COMMUNITY)
Admission: RE | Admit: 2023-08-20 | Discharge: 2023-08-20 | Disposition: A | Discharge status: Home / Self Care | Source: Ambulatory Visit | Attending: Cardiology | Admitting: Cardiology

## 2023-08-20 ENCOUNTER — Encounter (HOSPITAL_COMMUNITY): Payer: Self-pay | Admitting: Cardiology

## 2023-08-20 ENCOUNTER — Ambulatory Visit (HOSPITAL_COMMUNITY)

## 2023-08-20 DIAGNOSIS — N182 Chronic kidney disease, stage 2 (mild): Secondary | ICD-10-CM

## 2023-08-20 DIAGNOSIS — Z79899 Other long term (current) drug therapy: Secondary | ICD-10-CM | POA: Insufficient documentation

## 2023-08-20 DIAGNOSIS — I514 Myocarditis, unspecified: Secondary | ICD-10-CM | POA: Diagnosis not present

## 2023-08-20 DIAGNOSIS — I5022 Chronic systolic (congestive) heart failure: Secondary | ICD-10-CM

## 2023-08-20 DIAGNOSIS — F1910 Other psychoactive substance abuse, uncomplicated: Secondary | ICD-10-CM | POA: Diagnosis not present

## 2023-08-20 DIAGNOSIS — I428 Other cardiomyopathies: Secondary | ICD-10-CM | POA: Diagnosis not present

## 2023-08-20 DIAGNOSIS — R0683 Snoring: Secondary | ICD-10-CM | POA: Insufficient documentation

## 2023-08-20 DIAGNOSIS — I5023 Acute on chronic systolic (congestive) heart failure: Secondary | ICD-10-CM | POA: Diagnosis present

## 2023-08-20 DIAGNOSIS — N1831 Chronic kidney disease, stage 3a: Secondary | ICD-10-CM | POA: Diagnosis not present

## 2023-08-20 DIAGNOSIS — Z7901 Long term (current) use of anticoagulants: Secondary | ICD-10-CM | POA: Diagnosis not present

## 2023-08-20 MED ORDER — POTASSIUM CHLORIDE CRYS ER 20 MEQ PO TBCR
40.0000 meq | EXTENDED_RELEASE_TABLET | Freq: Every day | ORAL | 5 refills | Status: DC
  Filled 2023-08-20: qty 60, 30d supply, fill #0

## 2023-08-20 MED ORDER — SACUBITRIL-VALSARTAN 24-26 MG PO TABS
1.0000 | ORAL_TABLET | Freq: Two times a day (BID) | ORAL | 3 refills | Status: DC
  Filled 2023-08-20: qty 60, 30d supply, fill #0

## 2023-08-20 MED ORDER — TORSEMIDE 20 MG PO TABS
40.0000 mg | ORAL_TABLET | Freq: Every day | ORAL | 5 refills | Status: AC
  Filled 2023-08-20: qty 60, 30d supply, fill #0

## 2023-08-20 NOTE — Progress Notes (Signed)
 ADVANCED HF CLINIC NOTE  Primary Care: none HF Cardiologist: Dr. Cherrie  CC: Chronic systolic heart failure  HPI: [name] is a 38 y.o. with obesity, tobacco abuse and systolic heart failure due to NICM presenting today to follow up. Cardiac hx dates back to 3/23,  TTE EF 20-25%. R/LHC with normal cors, cMRI with LVEF 15%, RVEF 33%, findings consistent with myopericarditis. Viral panel negative.   Lost to follow up unfortunately. He was seen in [facility] ED in 9/24 with shortness of breath and chest pressure after he had stopped taking his HF medications for a week.  UDS + for cocaine and fentanyl .   He was admitted in  May 2025 at [facility] for decompensated heart failure.  Patient was aggressively diuresed with right heart cath demonstrating cardiac output of 2 L/min/m.    Overall doing fair, he denies any issues with volume symptoms, has been taking three 20mg  tablets of torsemide  occasionally recently, no change in his volume symptoms. He denies any significant or worsening shortness of breath and is able to complete all his ADLs. Reviewed his CPX results, stressed the importance of continued abstinence from drugs.     Current Outpatient Medications  Medication Sig Dispense Refill   acetaminophen  (TYLENOL ) 500 MG tablet Take 500 mg by mouth every 6 (six) hours as needed for fever, headache or mild pain (pain score 1-3).     apixaban  (ELIQUIS ) 5 MG TABS tablet Take 1 tablet (5 mg total) by mouth 2 (two) times daily. 180 tablet 3   digoxin  (LANOXIN ) 0.125 MG tablet Take 1/2 tablet (0.0625 mg total) by mouth daily. 30 tablet 3   empagliflozin  (JARDIANCE ) 10 MG TABS tablet Take 1 tablet (10 mg total) by mouth daily before breakfast. 90 tablet 3   metoprolol  succinate (TOPROL  XL) 25 MG 24 hr tablet Take 1 tablet (25 mg total) by mouth daily. 30 tablet 3   sacubitril -valsartan  (ENTRESTO ) 24-26 MG Take 1 tablet by mouth 2 (two) times daily. 60 tablet 3   spironolactone  (ALDACTONE ) 25 MG  tablet Take 1 tablet (25 mg total) by mouth daily. 30 tablet 6   potassium chloride  SA (KLOR-CON  M) 20 MEQ tablet Take 2 tablets (40 mEq total) by mouth daily. 60 tablet 5   torsemide  (DEMADEX ) 20 MG tablet Take 2 tablets (40 mg total) by mouth daily. 60 tablet 5   No current facility-administered medications for this encounter.    BP 112/70   Pulse 96   SpO2 99%   Wt Readings from Last 3 Encounters:  07/20/23 126.4 kg (278 lb 9.6 oz)  06/21/23 124.7 kg (275 lb)  06/21/23 125.7 kg (277 lb 3.2 oz)   PHYSICAL EXAM: Vitals:   08/20/23 1101  BP: 112/70  Pulse: 96  SpO2: 99%    GENERAL: NAD Lungs- normal work of breathing CARDIAC:  JVP: 7 cm          Normal rate with regular rhythm. no murmur.  Pulses 2+. no edema.  ABDOMEN: Soft, non-tender, non-distended.  EXTREMITIES: Warm and well perfused.  NEUROLOGIC: No obvious FND  ECG 06/11/23: sinus tachycardia, personally reviewed   Cardiac Studies Echo (8/23): EF 20-25%, moderate LVH, RV low/normal. cMRI (3/23): EF 15% with markedly dilated LV with evidence of diffuse myopericarditis. RV 33% R/LHC (3/23): normal cors EF 25% RA = 3. RV = 32/10. PA = 34/20 (26). PCW = 17 Fick cardiac output/index = 6.1/2.5. PVR = 1.5 WU. Ao sat = 94%. PA sat = 68%, 68% RHC (  06/14/23): RA 6, RV 46/10, PA 44/25 with a mean of 33, PCWP 24, cardiac output 2 L/min/m. CPX: Moderate to severely reduced peak VO2 of 12.33ml/kg/min (on BB) (43.4% of matched norms). RER 1.23, VE/VCO2 slope is elevated, O2pulse with a  peak of 41mL/beat  ASSESSMENT & PLAN:  1. Acute on chronic Systolic Heart Failure - Nonischemic cardiomyopathy. / myocarditis. - Echo (3/23): EF 25% - R/LHC (3/23): normal cors and well compensated filling pressures. - cMRI (3/23): LVEF 15% with markedly dilated LV, RVEF 33%, with evidence of diffuse myopericarditis. Viral panel (-) - Echo (8/23) EF 20-25%, moderate LVH, RV low/normal. - Echo (12/23): EF 20-25 RV mildly down  - Echo 2/25:  EF<20%, RV moderately reduced, ? thrombus - NHYA IIB-III; fiance feels his functional class is worse than he reports to us .  - CPX borderline severe, discussed rationale today as well as the potential need for advanced therapies in the future - Appears euvolemic, will transition to entresto  24/26mg  BID and reduce torsemide  to 40mg  daily, Kcl to 40mEq daily. Instructed to return to 80mg  daily for weight gain or worsneing SOA - Continue digoxin  for now with reduction in diuretics, hopefully stop at next visit, last dig level <0.4.  - Continue metoprolol  succinate 25mg  daily - Continue spironolactone  25 mg daily - continue jardiance  10mg  daily.   2. LV thrombus - Swirling of contrast w/o discrete thrombus on TTE from 2/25. Patient was started on apixaban . Will repeat TTE to reassess. Missed appointment for echo, was given wrong time - Continue Eliquis  5 mg bid.  - Repeat TTE w/ contrast at follow up.   3. Substance abuse - UDS x 2 now negative. Reports complete abstinence.    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   6. Snoring - Concern for OSA - Consider sleep study at follow up  7. SDOH - Now has medicaid - He has transportation - Engage HFSW for resources  I spent 45 minutes caring for this patient today including face to face time, ordering and reviewing labs, reviewing records from CPX, multiple hospital stays, previous cardiac workup, seeing the patient, documenting in the record, and arranging follow ups.    Morene JINNY Brownie, MD  3:39 PM

## 2023-08-20 NOTE — Patient Instructions (Addendum)
 Medication Changes:  START ENTRESTO  24/26MG  TWICE DAILY   INCREASE TORSEMIDE  TO 40MG  ONCE DAILY   INCREASE POTASSIUM TO ONCE DAILY   STOP LOSARTAN    Lab Work:  Engineer, technical sales FOR LABS IN 2 WEEKS AS SCHEDULED   Follow-Up in: 1 MONTH WITH DR. GARDENIA AS SCHEDULED   At the [facility], you and your health needs are our priority. We have a designated team specialized in the treatment of Heart Failure. This Care Team includes your primary Heart Failure Specialized Cardiologist (physician), Advanced Practice Providers (APPs- Physician Assistants and Nurse Practitioners), and Pharmacist who all work together to provide you with the care you need, when you need it.   You may see any of the following providers on your designated Care Team at your next follow up:  Dr. Toribio Fuel Dr. Ezra Shuck Dr. Ria GARDENIA Dr. Odis Brownie Greig Mosses, NP Caffie Shed, [place] [facility] [place], [place] Beckey Coe, NP [place] Lee, NP Tinnie Redman, PharmD   Please be sure to bring in all your medications bottles to every appointment.   Need to Contact Us :  If you have any questions or concerns before your next appointment please send us  a message through [place] or call our office at [phone].    TO LEAVE A MESSAGE FOR THE NURSE SELECT OPTION 2, PLEASE LEAVE A MESSAGE INCLUDING: YOUR NAME DATE OF BIRTH CALL BACK NUMBER REASON FOR CALL**this is important as we prioritize the call backs  YOU WILL RECEIVE A CALL BACK THE SAME DAY AS LONG AS YOU CALL BEFORE 4:00 PM

## 2023-08-23 ENCOUNTER — Telehealth: Payer: Self-pay | Admitting: Family

## 2023-08-23 ENCOUNTER — Encounter: Admitting: Family

## 2023-08-23 NOTE — Progress Notes (Signed)
 Erroneous encounter-disregard

## 2023-08-23 NOTE — Telephone Encounter (Signed)
 Called pt to reschedule missed appt; could not reach or leave vm

## 2023-08-27 ENCOUNTER — Other Ambulatory Visit (HOSPITAL_COMMUNITY): Payer: Self-pay

## 2023-09-03 ENCOUNTER — Ambulatory Visit (HOSPITAL_BASED_OUTPATIENT_CLINIC_OR_DEPARTMENT_OTHER): Attending: Family | Admitting: Internal Medicine

## 2023-09-03 ENCOUNTER — Other Ambulatory Visit (HOSPITAL_COMMUNITY)

## 2023-09-03 DIAGNOSIS — G4733 Obstructive sleep apnea (adult) (pediatric): Secondary | ICD-10-CM | POA: Diagnosis not present

## 2023-09-03 DIAGNOSIS — R0683 Snoring: Secondary | ICD-10-CM

## 2023-09-12 NOTE — Procedures (Signed)
  Indications for Polysomnography The patient is a 38 year-old Male who is 6' and weighs 274.0 lbs. His BMI equals 37.5.  A full night polysomnogram was performed to evaluate for -.  MedicationNo Data. Polysomnogram Data A full night polysomnogram recorded the standard physiologic parameters including EEG, EOG, EMG, EKG, nasal and oral airflow.  Respiratory parameters of chest and abdominal movements were recorded with Respiratory Inductance Plethysmography belts.   Oxygen saturation was recorded by pulse oximetry.  Sleep Architecture The total recording time of the polysomnogram was 400.0 minutes.  The total sleep time was 289.5 minutes.  The patient spent 2.8% of total sleep time in Stage N1, 79.8% in Stage N2, 0.0% in Stages N3, and 17.4% in REM.  Sleep latency was 52.7 minutes.   REM latency was 182.0 minutes.  Sleep Efficiency was 72.4%.  Wake after Sleep Onset time was 58.0 minutes.  Respiratory Events The polysomnogram revealed a presence of - obstructive, 174 central, and 3 mixed apneas resulting in an Apnea index of 36.7 events per hour.  There were 84 hypopneas (GreaterEqual to3% desaturation and/or arousal) resulting in an Apnea\Hypopnea Index  (AHI GreaterEqual to3% desaturation and/or arousal) of 54.1 events per hour.  There were 69 hypopneas (GreaterEqual to4% desaturation) resulting in an Apnea\Hypopnea Index (AHI GreaterEqual to4% desaturation) of 51.0 events per hour.  There were -  Respiratory Effort Related Arousals resulting in a RERA index of - events per hour. The Respiratory Disturbance Index is 54.1 events per hour.  The snore index was 35.9 events per hour.  Mean oxygen saturation was 93.8%.  The lowest oxygen saturation during sleep was 78.0%.  Time spent LessEqual to88% oxygen saturation was  minutes ().  Limb Activity There were 76 total limb movements recorded, of this total, 24 were classified as PLMs.  PLM index was 5.0 per hour and PLM associated with Arousals  index was 0.4 per hour.  Cardiac Summary The average pulse rate was 93.0 bpm.  The minimum pulse rate was 58.0 bpm while the maximum pulse rate was 104.0 bpm.  Cardiac rhythm was normal/abnormal.  Comments:  Diagnosis:  Recommendations:   This study was personally reviewed and electronically signed by: Reggy Salt MD Accredited Board Certified in Sleep Medicine Date/Time:

## 2023-09-12 NOTE — Procedures (Signed)
 [facility] [address] [place], [place] [address] Tel: [phone]   Fax: [phone]  Polysomnography Interpretation  Patient Name:  Robert Daniels, Robert Daniels Date:  09/03/2023 Referring Physician:  AMY STEPHENS [phone]) %%startinterp%% Indications for Polysomnography The patient is a 38 year-old Male who is 6' and weighs 274.0 lbs. His BMI equals 37.5.  A full night polysomnogram was performed to evaluate for -.OSA  Medication  No Data.   Polysomnogram Data A full night polysomnogram recorded the standard physiologic parameters including EEG, EOG, EMG, EKG, nasal and oral airflow.  Respiratory parameters of chest and abdominal movements were recorded with Respiratory Inductance Plethysmography belts.  Oxygen saturation was recorded by pulse oximetry.   Sleep Architecture The total recording time of the polysomnogram was 400.0 minutes.  The total sleep time was 289.5 minutes.  The patient spent 2.8% of total sleep time in Stage N1, 79.8% in Stage N2, 0.0% in Stages N3, and 17.4% in REM.  Sleep latency was 52.7 minutes.  REM latency was 182.0 minutes.  Sleep Efficiency was 72.4%.  Wake after Sleep Onset time was 58.0 minutes.  Respiratory Events The polysomnogram revealed a presence of - obstructive, 174 central, and 3 mixed apneas resulting in an Apnea index of 36.7 events per hour.  There were 84 hypopneas (>=3% desaturation and/or arousal) resulting in an Apnea\Hypopnea Index (AHI >=3% desaturation and/or arousal) of 54.1 events per hour.  There were 69 hypopneas (>=4% desaturation) resulting in an Apnea\Hypopnea Index (AHI >=4% desaturation) of 51.0 events per hour.  There were - Respiratory Effort Related Arousals resulting in a RERA index of - events per hour. The Respiratory Disturbance Index is 54.1 events per hour.  The snore index was 35.9 events per hour.  Mean oxygen saturation was 93.8%.  The lowest oxygen saturation during sleep was  78.0%.  Time spent <=88% oxygen saturation was 50.1 minutes (13.7%).  Limb Activity There were 76 total limb movements recorded, of this total, 24 were classified as PLMs.  PLM index was 5.0 per hour and PLM associated with Arousals index was 0.4 per hour.  Cardiac Summary The average pulse rate was 93.0 bpm.  The minimum pulse rate was 58.0 bpm while the maximum pulse rate was 104.0 bpm.  Cardiac rhythm was normal.   Comments: Severe obstructive sleep apnea, AHI (3%) 54.1/hr. Snoring with oxygen desaturation to a nadir of 78%, mean 93.8%.  Diagnosis: Obstructive sleep apnea  Recommendations: Suggest autopap or CPAP titration sleep study.    This study was personally reviewed and electronically signed by: Reggy Salt MD Accredited Board Certified in Sleep Medicine Date/Time: 09/12/23  12:11    %%endinterp%%   Diagnostic PSG Report  Patient Name: Robert Daniels Study Date: 09/03/2023  Date of Birth: Dec 03, 1985 Study Type: Diagnostic  Age: 48 year MRN #: [id number]  Sex: Male Interpreting Physician: SALT REGGY, [id number]  Height: 6' Referring Physician: AMY STEPHENS ([id number])  Weight: 274.0 lbs Recording Tech: Prentice Coombe RPSGT RST  BMI: 37.5 Scoring Tech: Prentice Coombe RPSGT RST  ESS: 4 Neck Size: 17   Study Overview  Lights Off: 09:28:25 PM  Count Index  Lights On: 04:08:24 AM Awakenings: 11 2.3  Time in Bed: 400.0 min. Arousals: 8 1.7  Total Sleep Time: 289.5 min. AHI (>=3% Desat and/or Ar.): 261 54.1   Sleep Efficiency: 72.4% AHI (>=4% Desat): 246 51.0   Sleep Latency: 52.7 min. Limb Movements: 76 15.8  Wake After Sleep Onset: 58.0 min. Snore: 173  35.9  REM Latency from Sleep Onset: 182.0 min. Desaturations: 270 56.2     Minimum SpO2 TST: 78.0%    Sleep Architecture  % of Time in Bed Stages Time (mins) % Sleep Time  Wake 111.0   Stage N1 8.0 2.8%  Stage N2 231.0 79.8%  Stage N3 0.0 0.0%  REM 50.5 17.4%   Arousal Summary   NREM REM Sleep Index  Respiratory  Arousals 6 - 6 1.2  PLM Arousals 2 - 2 0.4  Isolated Limb Movement Arousals 2 - 2 0.4  Snore Arousals - - - -  Spontaneous Arousals 2 - 2 0.4  Total 8 - 8 1.7   Limb Movement Summary   Count Index  Isolated Limb Movements 52 10.8  Periodic Limb Movements (PLMs) 24 5.0  Total Limb Movements 76 15.8    Respiratory Summary   By Sleep Stage By Body Position Total   NREM REM Supine Non-Supine   Time (min) 239.0 50.5 119.0 170.5 289.5         Obstructive Apnea - - - - -  Mixed Apnea 2 1 - 3 3  Central Apnea 173 1 60 114 174  Total Apneas 175 2 60 117 177  Total Apnea Index 43.9 2.4 30.3 41.2 36.7         Hypopneas (>=3% Desat and/or Ar.) 48 36 24 60 84  AHI (>=3% Desat and/or Ar.) 56.0 45.1 42.4 62.3 54.1         Hypopneas (>=4% Desat) 45 24 23 46 69  AHI (>=4% Desat) 55.2 30.9 41.8 57.4 51.0          RERAs - - - - -  RERA Index - - - - -         RDI 56.0 45.1 42.4 62.3 54.1    Respiratory Event Type Index  Central Apneas 36.1  Obstructive Apneas -  Mixed Apneas 0.6  Central Hypopneas -  Obstructive Hypopneas 18.0  Central Apnea + Hypopnea (CAHI) 36.1  Obstructive Apnea + Hypopnea (OAHI) 18.7   Respiratory Event Durations   Apnea Hypopnea   NREM REM NREM REM  Average (seconds) 21.3 20.5 27.7 34.6  Maximum (seconds) 28.4 21.1 37.4 59.0    Oxygen Saturation Summary   Wake NREM REM TST TIB  Average SpO2 (%) 97.0% 92.5% 93.5% 92.7% 93.8%  Minimum SpO2 (%) 83.0% 78.0% 84.0% 78.0% 78.0%  Maximum SpO2 (%) 100.0% 100.0% 99.0% 100.0% 100.0%   Oxygen Saturation Distribution  Range (%) Time in range (min) Time in range (%)  90.0 - 100.0 293.2 80.0%  80.0 - 90.0 71.5 19.5%  70.0 - 80.0 1.9 0.5%  60.0 - 70.0 - -  50.0 - 60.0 - -  0.0 - 50.0 - -  Time Spent <=88% SpO2  Range (%) Time in range (min) Time in range (%)  0.0 - 88.0 50.1 13.7%      Count Index  Desaturations 270 56.2    Cardiac Summary   Wake NREM REM Sleep Total  Average Pulse Rate (BPM)  95.8 92.4 90.1 91.9 93.0  Minimum Pulse Rate (BPM) 60.0 67.0 58.0 58.0 58.0  Maximum Pulse Rate (BPM) 103.0 104.0 95.0 104.0 104.0   Pulse Rate Distribution:  Range (bpm) Time in range (min) Time in range (%)  0.0 - 40.0 - -  40.0 - 60.0 0.1 0.0%  60.0 - 80.0 1.3 0.3%  80.0 - 100.0 363.9 98.2%  100.0 - 120.0 5.4 1.4%  120.0 - 140.0 - -  140.0 - 200.0 - -      Hypnograms                      Technologist Comments  STUDY TYPE= PSG DATE -09/03/2023 ROOM#  SEVERE RESPIRATORY EVENTS. HEAVY SNORES.  PATIENT PRESENT HERE AT 8PM FOR BASELINE STUDY. TECH EXPLAINED THE PROCESS OF HOOK UP AND WIRES. PATIENT EXPRESSED UNDERSTANDING.  PATIENT HAD DIFFICULTY FALLING ASLEEP.  TECH OBSERVED  MODERATE TO SEVERE. SLEEP DISORDER BREATHING AS THE STUDY PROGRESSED THROUGH THE NIGHT.  NO MEDICATION WAS TAKING AT THE LAB. PATIENT SLEPT LEFT, RIGHT AND SUPINE. NO SIGNIFICANT PLMs. BATHROOM BREAK WAS TAKING ONE TIME.  PATIENT WAS RESTLESS THROUGH THE STUDY.                            Reggy Salt Diplomate, Biomedical engineer of Sleep Medicine  ELECTRONICALLY SIGNED ON:  09/12/2023, 12:08 PM [facility] PH: [phone]   FX: [phone] ACCREDITED BY THE AMERICAN ACADEMY OF SLEEP MEDICINE

## 2023-09-14 ENCOUNTER — Ambulatory Visit: Payer: Self-pay | Admitting: Family

## 2023-09-16 ENCOUNTER — Other Ambulatory Visit: Payer: Self-pay

## 2023-09-16 ENCOUNTER — Inpatient Hospital Stay (HOSPITAL_COMMUNITY)
Admission: EM | Admit: 2023-09-16 | Discharge: 2023-09-27 | DRG: 286 | Disposition: A | Discharge status: Home / Self Care | Attending: Internal Medicine | Admitting: Internal Medicine

## 2023-09-16 ENCOUNTER — Encounter (HOSPITAL_COMMUNITY): Payer: Self-pay

## 2023-09-16 DIAGNOSIS — N179 Acute kidney failure, unspecified: Secondary | ICD-10-CM | POA: Diagnosis present

## 2023-09-16 DIAGNOSIS — G4730 Sleep apnea, unspecified: Secondary | ICD-10-CM

## 2023-09-16 DIAGNOSIS — I5082 Biventricular heart failure: Secondary | ICD-10-CM | POA: Diagnosis present

## 2023-09-16 DIAGNOSIS — I428 Other cardiomyopathies: Secondary | ICD-10-CM | POA: Diagnosis present

## 2023-09-16 DIAGNOSIS — R739 Hyperglycemia, unspecified: Secondary | ICD-10-CM | POA: Diagnosis present

## 2023-09-16 DIAGNOSIS — K029 Dental caries, unspecified: Secondary | ICD-10-CM | POA: Diagnosis present

## 2023-09-16 DIAGNOSIS — E66812 Obesity, class 2: Secondary | ICD-10-CM | POA: Diagnosis present

## 2023-09-16 DIAGNOSIS — I472 Ventricular tachycardia, unspecified: Secondary | ICD-10-CM | POA: Diagnosis present

## 2023-09-16 DIAGNOSIS — E871 Hypo-osmolality and hyponatremia: Secondary | ICD-10-CM | POA: Diagnosis present

## 2023-09-16 DIAGNOSIS — I5023 Acute on chronic systolic (congestive) heart failure: Secondary | ICD-10-CM | POA: Diagnosis present

## 2023-09-16 DIAGNOSIS — N182 Chronic kidney disease, stage 2 (mild): Secondary | ICD-10-CM | POA: Diagnosis present

## 2023-09-16 DIAGNOSIS — Z79899 Other long term (current) drug therapy: Secondary | ICD-10-CM

## 2023-09-16 DIAGNOSIS — I130 Hypertensive heart and chronic kidney disease with heart failure and stage 1 through stage 4 chronic kidney disease, or unspecified chronic kidney disease: Principal | ICD-10-CM | POA: Diagnosis present

## 2023-09-16 DIAGNOSIS — Z6838 Body mass index (BMI) 38.0-38.9, adult: Secondary | ICD-10-CM

## 2023-09-16 DIAGNOSIS — Z7984 Long term (current) use of oral hypoglycemic drugs: Secondary | ICD-10-CM

## 2023-09-16 DIAGNOSIS — G4733 Obstructive sleep apnea (adult) (pediatric): Secondary | ICD-10-CM | POA: Diagnosis present

## 2023-09-16 DIAGNOSIS — I493 Ventricular premature depolarization: Secondary | ICD-10-CM | POA: Diagnosis present

## 2023-09-16 DIAGNOSIS — Z7901 Long term (current) use of anticoagulants: Secondary | ICD-10-CM

## 2023-09-16 DIAGNOSIS — E876 Hypokalemia: Secondary | ICD-10-CM | POA: Diagnosis present

## 2023-09-16 DIAGNOSIS — F17210 Nicotine dependence, cigarettes, uncomplicated: Secondary | ICD-10-CM | POA: Diagnosis present

## 2023-09-16 DIAGNOSIS — I272 Pulmonary hypertension, unspecified: Secondary | ICD-10-CM | POA: Diagnosis present

## 2023-09-16 DIAGNOSIS — Z87898 Personal history of other specified conditions: Secondary | ICD-10-CM

## 2023-09-16 DIAGNOSIS — K5900 Constipation, unspecified: Secondary | ICD-10-CM | POA: Diagnosis present

## 2023-09-16 DIAGNOSIS — N289 Disorder of kidney and ureter, unspecified: Secondary | ICD-10-CM

## 2023-09-16 DIAGNOSIS — I513 Intracardiac thrombosis, not elsewhere classified: Secondary | ICD-10-CM | POA: Diagnosis present

## 2023-09-16 DIAGNOSIS — R0602 Shortness of breath: Principal | ICD-10-CM

## 2023-09-16 DIAGNOSIS — Z716 Tobacco abuse counseling: Secondary | ICD-10-CM

## 2023-09-16 DIAGNOSIS — Z5986 Financial insecurity: Secondary | ICD-10-CM

## 2023-09-16 DIAGNOSIS — I10 Essential (primary) hypertension: Secondary | ICD-10-CM | POA: Diagnosis present

## 2023-09-16 DIAGNOSIS — I5022 Chronic systolic (congestive) heart failure: Secondary | ICD-10-CM

## 2023-09-16 NOTE — ED Triage Notes (Signed)
 Pt states he has had shob, upper abdominal pain and nausea x 4 days. Pt has been constipated. Pt had similar issues before and is taking fluid pills w/o relief.

## 2023-09-16 NOTE — ED Triage Notes (Signed)
 Pt having grunting respirations at quick triage

## 2023-09-16 NOTE — ED Triage Notes (Signed)
 Pt coming in reporting that his torsemide  is not working. Pt reports shortness of breath and weight gain of 16 lbs in 5 days. Pt also reporting epigastric pain.

## 2023-09-17 ENCOUNTER — Emergency Department (HOSPITAL_COMMUNITY)

## 2023-09-17 ENCOUNTER — Inpatient Hospital Stay (HOSPITAL_COMMUNITY)

## 2023-09-17 ENCOUNTER — Other Ambulatory Visit: Payer: Self-pay

## 2023-09-17 DIAGNOSIS — I071 Rheumatic tricuspid insufficiency: Secondary | ICD-10-CM | POA: Diagnosis not present

## 2023-09-17 DIAGNOSIS — J984 Other disorders of lung: Secondary | ICD-10-CM | POA: Diagnosis not present

## 2023-09-17 DIAGNOSIS — N182 Chronic kidney disease, stage 2 (mild): Secondary | ICD-10-CM | POA: Diagnosis present

## 2023-09-17 DIAGNOSIS — Z6838 Body mass index (BMI) 38.0-38.9, adult: Secondary | ICD-10-CM | POA: Diagnosis not present

## 2023-09-17 DIAGNOSIS — I272 Pulmonary hypertension, unspecified: Secondary | ICD-10-CM | POA: Diagnosis present

## 2023-09-17 DIAGNOSIS — I472 Ventricular tachycardia, unspecified: Secondary | ICD-10-CM | POA: Diagnosis present

## 2023-09-17 DIAGNOSIS — G4733 Obstructive sleep apnea (adult) (pediatric): Secondary | ICD-10-CM | POA: Diagnosis present

## 2023-09-17 DIAGNOSIS — Z7189 Other specified counseling: Secondary | ICD-10-CM | POA: Diagnosis not present

## 2023-09-17 DIAGNOSIS — I5082 Biventricular heart failure: Secondary | ICD-10-CM | POA: Diagnosis present

## 2023-09-17 DIAGNOSIS — E66812 Obesity, class 2: Secondary | ICD-10-CM | POA: Diagnosis present

## 2023-09-17 DIAGNOSIS — N179 Acute kidney failure, unspecified: Secondary | ICD-10-CM | POA: Diagnosis present

## 2023-09-17 DIAGNOSIS — F17210 Nicotine dependence, cigarettes, uncomplicated: Secondary | ICD-10-CM | POA: Diagnosis present

## 2023-09-17 DIAGNOSIS — Z87898 Personal history of other specified conditions: Secondary | ICD-10-CM

## 2023-09-17 DIAGNOSIS — I5021 Acute systolic (congestive) heart failure: Secondary | ICD-10-CM

## 2023-09-17 DIAGNOSIS — R0602 Shortness of breath: Secondary | ICD-10-CM | POA: Diagnosis present

## 2023-09-17 DIAGNOSIS — K029 Dental caries, unspecified: Secondary | ICD-10-CM | POA: Diagnosis present

## 2023-09-17 DIAGNOSIS — Z7984 Long term (current) use of oral hypoglycemic drugs: Secondary | ICD-10-CM | POA: Diagnosis not present

## 2023-09-17 DIAGNOSIS — I10 Essential (primary) hypertension: Secondary | ICD-10-CM | POA: Diagnosis not present

## 2023-09-17 DIAGNOSIS — Z01810 Encounter for preprocedural cardiovascular examination: Secondary | ICD-10-CM | POA: Diagnosis not present

## 2023-09-17 DIAGNOSIS — I428 Other cardiomyopathies: Secondary | ICD-10-CM | POA: Diagnosis present

## 2023-09-17 DIAGNOSIS — E876 Hypokalemia: Secondary | ICD-10-CM | POA: Diagnosis present

## 2023-09-17 DIAGNOSIS — R739 Hyperglycemia, unspecified: Secondary | ICD-10-CM | POA: Diagnosis present

## 2023-09-17 DIAGNOSIS — Z7901 Long term (current) use of anticoagulants: Secondary | ICD-10-CM | POA: Diagnosis not present

## 2023-09-17 DIAGNOSIS — E871 Hypo-osmolality and hyponatremia: Secondary | ICD-10-CM | POA: Diagnosis present

## 2023-09-17 DIAGNOSIS — Z79899 Other long term (current) drug therapy: Secondary | ICD-10-CM | POA: Diagnosis not present

## 2023-09-17 DIAGNOSIS — Z515 Encounter for palliative care: Secondary | ICD-10-CM | POA: Diagnosis not present

## 2023-09-17 DIAGNOSIS — K5900 Constipation, unspecified: Secondary | ICD-10-CM | POA: Diagnosis present

## 2023-09-17 DIAGNOSIS — I513 Intracardiac thrombosis, not elsewhere classified: Secondary | ICD-10-CM | POA: Diagnosis present

## 2023-09-17 DIAGNOSIS — I130 Hypertensive heart and chronic kidney disease with heart failure and stage 1 through stage 4 chronic kidney disease, or unspecified chronic kidney disease: Secondary | ICD-10-CM | POA: Diagnosis present

## 2023-09-17 DIAGNOSIS — I493 Ventricular premature depolarization: Secondary | ICD-10-CM | POA: Diagnosis present

## 2023-09-17 DIAGNOSIS — I5023 Acute on chronic systolic (congestive) heart failure: Secondary | ICD-10-CM | POA: Diagnosis present

## 2023-09-17 LAB — ECHOCARDIOGRAM COMPLETE
AR max vel: 3.3 cm2
AV Peak grad: 2 mmHg
Ao pk vel: 0.71 m/s
Area-P 1/2: 4.26 cm2
Est EF: <20
Height: 72 in
S' Lateral: 7.78 cm
Weight: 4539.71 [oz_av]

## 2023-09-17 LAB — CBC
HCT: 36.8 % — ABNORMAL LOW (ref 39.0–52.0)
Hemoglobin: 11.8 g/dL — ABNORMAL LOW (ref 13.0–17.0)
MCH: 28.1 pg (ref 26.0–34.0)
MCHC: 32.1 g/dL (ref 30.0–36.0)
MCV: 87.6 fL (ref 80.0–100.0)
Platelets: 247 K/uL (ref 150–400)
RBC: 4.2 MIL/uL — ABNORMAL LOW (ref 4.22–5.81)
RDW: 16.5 % — ABNORMAL HIGH (ref 11.5–15.5)
WBC: 10.8 K/uL — ABNORMAL HIGH (ref 4.0–10.5)
nRBC: 0.2 % (ref 0.0–0.2)

## 2023-09-17 LAB — BASIC METABOLIC PANEL WITH GFR
Anion gap: 12 (ref 5–15)
Anion gap: 12 (ref 5–15)
BUN: 22 mg/dL — ABNORMAL HIGH (ref 6–20)
BUN: 23 mg/dL — ABNORMAL HIGH (ref 6–20)
CO2: 22 mmol/L (ref 22–32)
CO2: 21 mmol/L — ABNORMAL LOW (ref 22–32)
Calcium: 9.2 mg/dL (ref 8.9–10.3)
Calcium: 8.7 mg/dL — ABNORMAL LOW (ref 8.9–10.3)
Chloride: 100 mmol/L (ref 98–111)
Chloride: 100 mmol/L (ref 98–111)
Creatinine, Ser: 1.86 mg/dL — ABNORMAL HIGH (ref 0.61–1.24)
Creatinine, Ser: 1.91 mg/dL — ABNORMAL HIGH (ref 0.61–1.24)
GFR, Estimated: 47 mL/min — ABNORMAL LOW (ref >60)
GFR, Estimated: 45 mL/min — ABNORMAL LOW (ref >60)
Glucose, Bld: 122 mg/dL — ABNORMAL HIGH (ref 70–99)
Glucose, Bld: 122 mg/dL — ABNORMAL HIGH (ref 70–99)
Potassium: 3.9 mmol/L (ref 3.5–5.1)
Potassium: 3.6 mmol/L (ref 3.5–5.1)
Sodium: 134 mmol/L — ABNORMAL LOW (ref 135–145)
Sodium: 133 mmol/L — ABNORMAL LOW (ref 135–145)

## 2023-09-17 LAB — I-STAT CHEM 8, ED
BUN: 24 mg/dL — ABNORMAL HIGH (ref 6–20)
Calcium, Ion: 1.12 mmol/L — ABNORMAL LOW (ref 1.15–1.40)
Chloride: 102 mmol/L (ref 98–111)
Creatinine, Ser: 2 mg/dL — ABNORMAL HIGH (ref 0.61–1.24)
Glucose, Bld: 119 mg/dL — ABNORMAL HIGH (ref 70–99)
HCT: 38 % — ABNORMAL LOW (ref 39.0–52.0)
Hemoglobin: 12.9 g/dL — ABNORMAL LOW (ref 13.0–17.0)
Potassium: 4.2 mmol/L (ref 3.5–5.1)
Sodium: 136 mmol/L (ref 135–145)
TCO2: 22 mmol/L (ref 22–32)

## 2023-09-17 LAB — RESP PANEL BY RT-PCR (RSV, FLU A&B, COVID)  RVPGX2
Influenza A by PCR: NEGATIVE
Influenza B by PCR: NEGATIVE
Resp Syncytial Virus by PCR: NEGATIVE
SARS Coronavirus 2 by RT PCR: NEGATIVE

## 2023-09-17 LAB — LACTIC ACID, PLASMA
Lactic Acid, Venous: 1.9 mmol/L (ref 0.5–1.9)
Lactic Acid, Venous: 1.9 mmol/L (ref 0.5–1.9)

## 2023-09-17 LAB — DIGOXIN LEVEL: Digoxin Level: <0.6 ng/mL — ABNORMAL LOW (ref 0.8–2.0)

## 2023-09-17 LAB — TROPONIN I (HIGH SENSITIVITY)
Troponin I (High Sensitivity): 53 ng/L — ABNORMAL HIGH (ref <18)
Troponin I (High Sensitivity): 58 ng/L — ABNORMAL HIGH (ref <18)

## 2023-09-17 LAB — COOXEMETRY PANEL
Carboxyhemoglobin: 0.6 % (ref 0.5–1.5)
Methemoglobin: 3 % — ABNORMAL HIGH (ref 0.0–1.5)
O2 Saturation: 38.1 %
Total hemoglobin: 11.4 g/dL — ABNORMAL LOW (ref 12.0–16.0)

## 2023-09-17 LAB — HEPATIC FUNCTION PANEL
ALT: 34 U/L (ref 0–44)
AST: 45 U/L — ABNORMAL HIGH (ref 15–41)
Albumin: 3.5 g/dL (ref 3.5–5.0)
Alkaline Phosphatase: 47 U/L (ref 38–126)
Bilirubin, Direct: 0.5 mg/dL — ABNORMAL HIGH (ref 0.0–0.2)
Indirect Bilirubin: 4.6 mg/dL — ABNORMAL HIGH (ref 0.3–0.9)
Total Bilirubin: 5.1 mg/dL — ABNORMAL HIGH (ref 0.0–1.2)
Total Protein: 7.5 g/dL (ref 6.5–8.1)

## 2023-09-17 LAB — BRAIN NATRIURETIC PEPTIDE: B Natriuretic Peptide: 2277.9 pg/mL — ABNORMAL HIGH (ref 0.0–100.0)

## 2023-09-17 LAB — I-STAT CG4 LACTIC ACID, ED
Lactic Acid, Venous: 2.5 mmol/L (ref 0.5–1.9)
Lactic Acid, Venous: 2.8 mmol/L (ref 0.5–1.9)

## 2023-09-17 MED ORDER — FUROSEMIDE 10 MG/ML IJ SOLN
80.0000 mg | Freq: Once | INTRAMUSCULAR | Status: AC
  Administered 2023-09-17: 80 mg via INTRAVENOUS
  Filled 2023-09-17: qty 8

## 2023-09-17 MED ORDER — SODIUM CHLORIDE 0.9% FLUSH
10.0000 mL | Freq: Two times a day (BID) | INTRAVENOUS | Status: DC
  Administered 2023-09-17 – 2023-09-22 (×8): 10 mL
  Administered 2023-09-22: 20 mL
  Administered 2023-09-23 – 2023-09-26 (×7): 10 mL
  Administered 2023-09-26: 30 mL
  Administered 2023-09-27: 10 mL

## 2023-09-17 MED ORDER — FUROSEMIDE 10 MG/ML IJ SOLN
100.0000 mg | Freq: Two times a day (BID) | INTRAVENOUS | Status: DC
  Filled 2023-09-17: qty 10

## 2023-09-17 MED ORDER — ONDANSETRON HCL 4 MG/2ML IJ SOLN: 4.0000 mg | Freq: Four times a day (QID) | INTRAMUSCULAR | Status: DC | PRN

## 2023-09-17 MED ORDER — PERFLUTREN LIPID MICROSPHERE
1.0000 mL | INTRAVENOUS | Status: AC | PRN
  Administered 2023-09-17: 2 mL via INTRAVENOUS

## 2023-09-17 MED ORDER — SODIUM CHLORIDE 0.9 % IV SOLN: 250.0000 mL | INTRAVENOUS | Status: AC | PRN

## 2023-09-17 MED ORDER — POLYETHYLENE GLYCOL 3350 17 G PO PACK
17.0000 g | PACK | Freq: Every day | ORAL | Status: DC
  Administered 2023-09-17 – 2023-09-20 (×4): 17 g via ORAL
  Filled 2023-09-17 (×9): qty 1

## 2023-09-17 MED ORDER — APIXABAN 5 MG PO TABS
5.0000 mg | ORAL_TABLET | Freq: Two times a day (BID) | ORAL | Status: DC
  Administered 2023-09-17 – 2023-09-27 (×21): 5 mg via ORAL
  Filled 2023-09-17 (×21): qty 1

## 2023-09-17 MED ORDER — SODIUM CHLORIDE 0.9% FLUSH: 10.0000 mL | INTRAVENOUS | Status: DC | PRN

## 2023-09-17 MED ORDER — FUROSEMIDE 10 MG/ML IJ SOLN
15.0000 mg/h | INTRAVENOUS | Status: DC
  Administered 2023-09-17 – 2023-09-19 (×4): 15 mg/h via INTRAVENOUS
  Filled 2023-09-17 (×4): qty 20

## 2023-09-17 MED ORDER — SODIUM CHLORIDE 0.9% FLUSH: 3.0000 mL | INTRAVENOUS | Status: DC | PRN

## 2023-09-17 MED ORDER — ACETAMINOPHEN 325 MG PO TABS
650.0000 mg | ORAL_TABLET | ORAL | Status: DC | PRN
  Administered 2023-09-19: 650 mg via ORAL
  Filled 2023-09-17: qty 2

## 2023-09-17 MED ORDER — FUROSEMIDE 10 MG/ML IJ SOLN
40.0000 mg | Freq: Once | INTRAMUSCULAR | Status: AC
  Administered 2023-09-17: 40 mg via INTRAVENOUS
  Filled 2023-09-17: qty 4

## 2023-09-17 MED ORDER — MILRINONE LACTATE IN DEXTROSE 20-5 MG/100ML-% IV SOLN
0.1250 ug/kg/min | INTRAVENOUS | Status: DC
  Administered 2023-09-17 – 2023-09-22 (×10): 0.25 ug/kg/min via INTRAVENOUS
  Administered 2023-09-22: 0.125 ug/kg/min via INTRAVENOUS
  Filled 2023-09-17 (×12): qty 100

## 2023-09-17 MED ORDER — EMPAGLIFLOZIN 10 MG PO TABS
10.0000 mg | ORAL_TABLET | Freq: Every day | ORAL | Status: DC
  Administered 2023-09-17 – 2023-09-27 (×11): 10 mg via ORAL
  Filled 2023-09-17 (×11): qty 1

## 2023-09-17 MED ORDER — POTASSIUM CHLORIDE CRYS ER 20 MEQ PO TBCR
40.0000 meq | EXTENDED_RELEASE_TABLET | Freq: Once | ORAL | Status: AC
  Administered 2023-09-17: 40 meq via ORAL
  Filled 2023-09-17: qty 2

## 2023-09-17 MED ORDER — CHLORHEXIDINE GLUCONATE CLOTH 2 % EX PADS
6.0000 | MEDICATED_PAD | Freq: Every day | CUTANEOUS | Status: DC
  Administered 2023-09-17 – 2023-09-27 (×11): 6 via TOPICAL

## 2023-09-17 MED ORDER — SODIUM CHLORIDE 0.9% FLUSH
3.0000 mL | Freq: Two times a day (BID) | INTRAVENOUS | Status: DC
  Administered 2023-09-17 – 2023-09-27 (×17): 3 mL via INTRAVENOUS

## 2023-09-17 NOTE — Assessment & Plan Note (Addendum)
 Continue blood pressure monitoring Patient on milrinone  Stopped furosemide  drip today

## 2023-09-17 NOTE — H&P (Addendum)
 [facility] History and Physical  [name] FMW:[id number] DOB: 10/24/1985 DOA: 09/16/2023   PCP: Lorren Greig PARAS, NP  Specialists: Followed by the heart failure clinic  Chief Complaint: Shortness of breath, weight gain  HPI: Robert Daniels is a 38 y.o. male with a past medical history of acute systolic CHF, LV thrombus, history of substance abuse, chronic kidney disease stage II who presented with complaints of shortness of breath and weight gain.  He mentioned that he has gained about 15 pounds in 2 days.  Patient was seen by Dr. Zenaida with heart failure on 08/20/2023.  During that visit patient was thought to be euvolemic.  He was transitioned to Entresto  24/26 twice daily and his torsemide  dose was decreased to 40 mg.  He was instructed to return to 80 mg for weight gain.  Digoxin  was continued at that time.  Patient presented with worsening shortness of breath ongoing for about 2 to 3 days with significant weight gain.  He did not increase the dose of his torsemide  back to 80 mg.  Denies any chest pain.  Some nausea.  No fever but has had some chills.  In the emergency department he was noted to have elevated BNP.  He was edematous.  He was given IV furosemide  and was hospitalized for further management.  Home Medications: This list is not reconciled yet. Prior to Admission medications   Medication Sig Start Date End Date Taking? Authorizing Provider  acetaminophen  (TYLENOL ) 500 MG tablet Take 500 mg by mouth every 6 (six) hours as needed for fever, headache or mild pain (pain score 1-3).    [provider]  apixaban  (ELIQUIS ) 5 MG TABS tablet Take 1 tablet (5 mg total) by mouth 2 (two) times daily. 07/20/23   Sabharwal, Aditya, DO  digoxin  (LANOXIN ) 0.125 MG tablet Take 1/2 tablet (0.0625 mg total) by mouth daily. 07/20/23   Sabharwal, Aditya, DO  empagliflozin  (JARDIANCE ) 10 MG TABS tablet Take 1 tablet (10 mg total) by mouth daily before breakfast. 07/20/23   Sabharwal,  Aditya, DO  metoprolol  succinate (TOPROL  XL) 25 MG 24 hr tablet Take 1 tablet (25 mg total) by mouth daily. 07/20/23   Sabharwal, Aditya, DO  potassium chloride  SA (KLOR-CON  M) 20 MEQ tablet Take 2 tablets (40 mEq total) by mouth daily. 08/20/23   Zenaida Morene PARAS, MD  sacubitril -valsartan  (ENTRESTO ) 24-26 MG Take 1 tablet by mouth 2 (two) times daily. 08/20/23   Zenaida Morene PARAS, MD  spironolactone  (ALDACTONE ) 25 MG tablet Take 1 tablet (25 mg total) by mouth daily. 07/20/23   Sabharwal, Aditya, DO  torsemide  (DEMADEX ) 20 MG tablet Take 2 tablets (40 mg total) by mouth daily. 08/20/23   Zenaida Morene PARAS, MD    Allergies: No Known Allergies  Past Medical History: Past Medical History:  Diagnosis Date   AKI (acute kidney injury) (HCC)    CHF (congestive heart failure) (HCC)    Gout    Hypertension     Past Surgical History:  Procedure Laterality Date   RIGHT HEART CATH N/A 06/14/2023   Procedure: RIGHT HEART CATH;  Surgeon: Gardenia Led, DO;  Location: [facility];  Service: Cardiovascular;  Laterality: N/A;   RIGHT/LEFT HEART CATH AND CORONARY ANGIOGRAPHY N/A 03/20/2021   Procedure: RIGHT/LEFT HEART CATH AND CORONARY ANGIOGRAPHY;  Surgeon: Cherrie Toribio SAUNDERS, MD;  Location: [facility];  Service: Cardiovascular;  Laterality: N/A;    Social History: Lives [place].  Quit smoking recently.  He drinks  [place] twice a week   Family History: Denies any health problems in the family  Review of Systems - History obtained from the patient General ROS: positive for  - chills and fatigue Psychological ROS: negative Ophthalmic ROS: negative ENT ROS: negative Allergy and Immunology ROS: negative Hematological and Lymphatic ROS: negative Endocrine ROS: negative Respiratory ROS: As in HPI Cardiovascular ROS: As in HPI Gastrointestinal ROS: As in HPI Genito-Urinary ROS: no dysuria, trouble voiding, or hematuria Musculoskeletal ROS: negative Neurological ROS: no TIA or  stroke symptoms Dermatological ROS: negative  Physical Examination  Vitals:   09/16/23 2337 09/16/23 2342  BP: (!) 137/101   Pulse: (!) 116   Resp: 19   Temp: 99.3 F (37.4 C)   SpO2: 99%   Weight:  122.5 kg  Height:  6' (1.829 m)    BP (!) 137/101 (BP Location: Right Arm)   Pulse (!) 116   Temp 99.3 F (37.4 C)   Resp 19   Ht 6' (1.829 m)   Wt 122.5 kg   SpO2 99%   BMI 36.62 kg/m   General appearance: alert, cooperative, appears stated age, and no distress Head: Normocephalic, without obvious abnormality, atraumatic Eyes: conjunctivae/corneas clear. PERRL, EOM's intact.  Throat: lips, mucosa, and tongue normal; teeth and gums normal Neck: no adenopathy, no carotid bruit, no JVD, supple, symmetrical, trachea midline, and thyroid not enlarged, symmetric, no tenderness/mass/nodules Resp: Crackles bilateral bases.  No wheezing or rhonchi.  Tachypneic at rest without use of accessory muscles. Cardio: regular rate and rhythm, S1, S2 normal, no murmur, click, rub or gallop GI: soft, non-tender; bowel sounds normal; no masses,  no organomegaly Extremities: 2+ edema bilateral lower extremities Pulses: 2+ and symmetric Skin: Skin color, texture, turgor normal. No rashes or lesions Lymph nodes: Cervical, supraclavicular, and axillary nodes normal. Neurologic: Awake and alert.  Oriented x 3.  No obvious focal neurological deficits noted.   Labs on Admission: I have personally reviewed following labs and imaging studies  CBC: Recent Labs  Lab 09/16/23 2353 09/17/23 0005  WBC 10.8*  --   HGB 11.8* 12.9*  HCT 36.8* 38.0*  MCV 87.6  --   PLT 247  --    Basic Metabolic Panel: Recent Labs  Lab 09/16/23 2353 09/17/23 0005  NA 134* 136  K 3.9 4.2  CL 100 102  CO2 22  --   GLUCOSE 122* 119*  BUN 22* 24*  CREATININE 1.86* 2.00*  CALCIUM 9.2  --    GFR: Estimated Creatinine Clearance: 67.7 mL/min (A) (by C-G formula based on SCr of 2 mg/dL (H)). Liver Function  Tests: Recent Labs  Lab 09/16/23 2353  AST 45*  ALT 34  ALKPHOS 47  BILITOT 5.1*  PROT 7.5  ALBUMIN 3.5      Radiological Exams on Admission: US  Abdomen Limited RUQ (LIVER/GB) Result Date: 09/17/2023 CLINICAL DATA:  Unspecified abdominal pain EXAM: ULTRASOUND ABDOMEN LIMITED RIGHT UPPER QUADRANT COMPARISON:  None Available. FINDINGS: Gallbladder: The gallbladder is largely decompressed. Apparent pericholecystic fluid likely represents the decompressed, thickened gallbladder wall best appreciated on image # 58. No gallstones or abnormal wall thickening visualized. No sonographic Murphy sign noted by sonographer. Common bile duct: Diameter: Less than 1 mm (completely decompressed) Liver: No focal lesion identified. Within normal limits in parenchymal echogenicity. Portal vein is patent on color Doppler imaging with normal direction of blood flow towards the liver. Other: None. IMPRESSION: 1. Normal right upper quadrant sonogram. Electronically Signed   By: Dorethia Kimberlee HERO.D.  On: 09/17/2023 01:30   DG Chest 2 View Result Date: 09/17/2023 CLINICAL DATA:  Dyspnea EXAM: CHEST - 2 VIEW COMPARISON:  06/10/2023 FINDINGS: Stable reticulonodular opacity within the right costophrenic angle, likely reflecting parenchymal scarring given stability over time. Lungs are otherwise clear. No pneumothorax or pleural effusion. Mild cardiomegaly is stable. Pulmonary vascularity is normal. No acute bone abnormality. IMPRESSION: 1. No active cardiopulmonary disease. 2. Stable mild cardiomegaly. Electronically Signed   By: Dorethia Molt M.D.   On: 09/17/2023 00:21    My interpretation of Electrocardiogram: Sinus tachycardia 117 bpm.  Normal axis.  Intervals are normal.  Nonspecific T wave changes.  No concerning ST segment changes noted.   Problem List  Principal Problem:   Acute on chronic systolic CHF (congestive heart failure) (HCC) Active Problems:   CKD (chronic kidney disease) stage 2, GFR 60-89  ml/min   AKI (acute kidney injury) (HCC)   Assessment: This is a 38 year old African-American male with past medical history of chronic systolic CHF who comes in with weight gain, shortness of breath and is found to be in acute CHF.  Probably secondary to decrease in dose of torsemide  last month.  Plan: Acute on chronic systolic CHF: His EF is noted to be 15% based on echocardiogram from February 2025.  He is followed by heart failure clinic.  His lactic acid level is noted to be elevated.  BNP is elevated.  He has been given dose of IV furosemide .  Additional dose will be given.  Cardiology will be consulted.  He might need to be on a Lasix  infusion.  Will hold his Entresto  due to increasing creatinine.  Hold his digoxin  for now.  Check digoxin  level.  Strict ins and outs and daily weights.  Repeat echocardiogram as was planned by cardiology at last office visit.  History of LV thrombus: Follow-up on echocardiogram.  Continue with apixaban   Hyperbilirubinemia: This is mostly indirect hyperbilirubinemia.  He has had elevated total bilirubin levels previously but never this high.  Right upper quadrant ultrasound was unremarkable.  Continue to trend LFTs.  Acute on chronic kidney disease stage II: Baseline creatinine around 1.4.  Comes in with elevated creatinine.  Likely cardiorenal.  Monitor electrolytes and renal function closely.  Monitor urine output.  Avoid nephrotoxic agents.  Medication list has not been reconciled yet.  DVT Prophylaxis: On apixaban  Code Status: Full code Family Communication: Discussed with patient Disposition: Hopefully return home when improved Consults called: Cardiology Admission Status: Inpatient status due to acute CHF requiring IV diuretics   Severity of Illness: The appropriate patient status for this patient is INPATIENT. Inpatient status is judged to be reasonable and necessary in order to provide the required intensity of service to ensure the patient's  safety. The patient's presenting symptoms, physical exam findings, and initial radiographic and laboratory data in the context of their chronic comorbidities is felt to place them at high risk for further clinical deterioration. Furthermore, it is not anticipated that the patient will be medically stable for discharge from the hospital within 2 midnights of admission.   * I certify that at the point of admission it is my clinical judgment that the patient will require inpatient hospital care spanning beyond 2 midnights from the point of admission due to high intensity of service, high risk for further deterioration and high frequency of surveillance required.*   Further management decisions will depend on results of further testing and patient's response to treatment.   Damyiah Moxley  [facility] Pager  on [email or web address]  09/17/2023, 3:46 AM

## 2023-09-17 NOTE — Assessment & Plan Note (Signed)
Calculated BMI is 38,4

## 2023-09-17 NOTE — Progress Notes (Signed)
 Peripherally Inserted Central Catheter Placement  The IV Nurse has discussed with the patient and/or persons authorized to consent for the patient, the purpose of this procedure and the potential benefits and risks involved with this procedure.  The benefits include less needle sticks, lab draws from the catheter, and the patient may be discharged home with the catheter. Risks include, but not limited to, infection, bleeding, blood clot (thrombus formation), and puncture of an artery; nerve damage and irregular heartbeat and possibility to perform a PICC exchange if needed/ordered by physician.  Alternatives to this procedure were also discussed.  Bard Power PICC patient education guide, fact sheet on infection prevention and patient information card has been provided to patient /or left at bedside.    PICC Placement Documentation  PICC Double Lumen 09/17/23 Right Brachial 41 cm 0 cm (Active)  Indication for Insertion or Continuance of Line Vasoactive infusions 09/17/23 1228  Exposed Catheter (cm) 0 cm 09/17/23 1228  Site Assessment Clean, Dry, Intact 09/17/23 1228  Lumen #1 Status Flushed;Saline locked;Blood return noted 09/17/23 1228  Lumen #2 Status Flushed;Saline locked;Blood return noted 09/17/23 1228  Dressing Type Transparent;Securing device 09/17/23 1228  Dressing Status Antimicrobial disc/dressing in place 09/17/23 1228  Line Care Connections checked and tightened 09/17/23 1228  Line Adjustment (NICU/IV Team Only) No 09/17/23 1228  Dressing Intervention New dressing;Adhesive placed at insertion site (IV team only) 09/17/23 1228  Dressing Change Due 09/24/23 09/17/23 1228       Leita  Paiton Fosco 09/17/2023, 12:31 PM

## 2023-09-17 NOTE — ED Notes (Signed)
 Report called to [facility]

## 2023-09-17 NOTE — Consult Note (Addendum)
 Advanced Heart Failure Team Consult Note   Primary Physician: Lorren Greig PARAS, NP HF Cardiologist: Dr Cherrie  Reason for Consultation: Heart Failure   HPI:    Robert Daniels is seen today for evaluation of heart failure at the request of Dr Noralee.   Robert Daniels is a 38 year with a history of chronic HFrEF, NICM, tobacco abuse, obesity, OSA, and cocaine abuse.   Initially diagnosed with HF in 2023. Cath RA 3, PCWP 17, CO 6.1 CI 2.5. Normal cors. cMRI LVEF 15% RV 33% and myopericarditis.   He was seen in [facility] ED 09/2022 with shortness of breath and chest pressure after he had stopped taking his HF medications for a week. Troponin negative X 2. ProBNP 5500. CTA chest with no PE but did show pulmonary edema. UDS + for cocaine and fentanyl . He diuresed with IV lasix  and felt to be stable for discharge home.   Had repeat Echo February 2025, concern for LV thrombus. Placed on Eliquis .   Admitted May 2025 with A/C HFrEF. Diuresed with IV lasix . Cath PA 44/25 (33) PCWP 24  Fick 4.9, CI 2 Thermo 4.7 CI 1.97   Admitted to [facility] July 2025 abdominal pain/nausea/vomiting. Presumed gastroenteritis given 2 friends had same symptoms after eating fish. He had been off diuretics a few d ays. Diuresed and transitioned to torsemide  80 mg daily. Echo LVEF 15% RV severely reduced, and no LV thrombus.   Sleep stude 08/2023 - AHI 54 Desaturation 78%, CPAP was recommended.   Followed in the closely in the HF clinic. Had CPX the end of July. Exercise testing with gas exchange shows a moderate to severe functional limitation due to HF and his obesity. When corrected to the patient's ideal predicted body weight, the pVO increases 50% to 19.0 ml/kg/min suggesting his body habitus is contributing significantly to his functional limitation.    Over the last week he reports increase shortness of breath with exertion and lower extremity edema. Weight went up 15 pounds over the last 7 days. He has been taking all  medications. He has been taking 80 mg torsemide  daily with poor urine output. Also complaining of poor appetite, nausea, and constipation. Denies cocaine use. Smoking cigarettes daily.   Presented to ED with increased shortness of breath. Pertinent admission labs:  Creatinine 1.8, Hgb 11.8, WBC 10.8, BNP 2277, CO2 22, HS Trop 58>55, lactic acid 2.8>2.5 >1.9. CXR mild cardiomegaly. Abd US - normal RUQ. Diuresing with IV lasix . He has had 120 mg IV lasix  over the last 8 hours. Poor response.    Home Medications Prior to Admission medications   Medication Sig Start Date End Date Taking? Authorizing Provider  acetaminophen  (TYLENOL ) 500 MG tablet Take 500 mg by mouth every 6 (six) hours as needed for fever, headache or mild pain (pain score 1-3).    [provider]  apixaban  (ELIQUIS ) 5 MG TABS tablet Take 1 tablet (5 mg total) by mouth 2 (two) times daily. 07/20/23   Sabharwal, Aditya, DO  digoxin  (LANOXIN ) 0.125 MG tablet Take 1/2 tablet (0.0625 mg total) by mouth daily. 07/20/23   Sabharwal, Aditya, DO  empagliflozin  (JARDIANCE ) 10 MG TABS tablet Take 1 tablet (10 mg total) by mouth daily before breakfast. 07/20/23   Sabharwal, Aditya, DO  metoprolol  succinate (TOPROL  XL) 25 MG 24 hr tablet Take 1 tablet (25 mg total) by mouth daily. 07/20/23   Sabharwal, Aditya, DO  potassium chloride  SA (KLOR-CON  M) 20 MEQ tablet Take 2 tablets (40 mEq  total) by mouth daily. 08/20/23   Zenaida Morene PARAS, MD  sacubitril -valsartan  (ENTRESTO ) 24-26 MG Take 1 tablet by mouth 2 (two) times daily. 08/20/23   Zenaida Morene PARAS, MD  spironolactone  (ALDACTONE ) 25 MG tablet Take 1 tablet (25 mg total) by mouth daily. 07/20/23   Sabharwal, Aditya, DO  torsemide  (DEMADEX ) 20 MG tablet Take 2 tablets (40 mg total) by mouth daily. 08/20/23   Zenaida Morene PARAS, MD    Past Medical History: Past Medical History:  Diagnosis Date   AKI (acute kidney injury) [facility])    CHF (congestive heart failure) (HCC)    Gout    Hypertension      Past Surgical History: Past Surgical History:  Procedure Laterality Date   RIGHT HEART CATH N/A 06/14/2023   Procedure: RIGHT HEART CATH;  Surgeon: Gardenia Led, DO;  Location: [facility] INVASIVE CV LAB;  Service: Cardiovascular;  Laterality: N/A;   RIGHT/LEFT HEART CATH AND CORONARY ANGIOGRAPHY N/A 03/20/2021   Procedure: RIGHT/LEFT HEART CATH AND CORONARY ANGIOGRAPHY;  Surgeon: Cherrie Toribio SAUNDERS, MD;  Location: [facility] INVASIVE CV LAB;  Service: Cardiovascular;  Laterality: N/A;    Family History: History reviewed. No pertinent family history.  Social History: Social History   Socioeconomic History   Marital status: Single    Spouse name: Not on file   Number of children: Not on file   Years of education: Not on file   Highest education level: Not on file  Occupational History   Not on file  Tobacco Use   Smoking status: Former    Types: Cigars, Cigarettes   Smokeless tobacco: Never  Vaping Use   Vaping status: Never Used  Substance and Sexual Activity   Alcohol use: Yes    Comment: occ   Drug use: Never   Sexual activity: Not on file  Other Topics Concern   Not on file  Social History Narrative   ** Merged History Encounter **       Social Drivers of Health   Financial Resource Strain: Low Risk  (08/02/2023)   Received from [facility]   Overall Financial Resource Strain (CARDIA)    How hard is it for you to pay for the very basics like food, housing, medical care, and heating?: Not very hard  Recent Concern: Financial Resource Strain - Medium Risk (07/23/2023)   Overall Financial Resource Strain (CARDIA)    Difficulty of Paying Living Expenses: Somewhat hard  Food Insecurity: No Food Insecurity (09/17/2023)   Hunger Vital Sign    Worried About Running Out of Food in the Last Year: Never true    Ran Out of Food in the Last Year: Never true  Transportation Needs: No Transportation Needs (09/17/2023)   PRAPARE - Administrator, Civil Service (Medical): No     Lack of Transportation (Non-Medical): No  Physical Activity: Insufficiently Active (06/21/2023)   Exercise Vital Sign    Days of Exercise per Week: 4 days    Minutes of Exercise per Session: 30 min  Stress: No Stress Concern Present (06/21/2023)   [employer] of Occupational Health - Occupational Stress Questionnaire    Feeling of Stress : Not at all  Social Connections: Unknown (06/10/2023)   Social Connection and Isolation Panel    Frequency of Communication with Friends and Family: More than three times a week    Frequency of Social Gatherings with Friends and Family: Twice a week    Attends Religious Services: 1 to 4 times per year  Active Member of Clubs or Organizations: No    Attends Banker Meetings: 1 to 4 times per year    Marital Status: Patient declined    Allergies:  No Known Allergies  Objective:    Vital Signs:   Temp:  [98.7 F (37.1 C)-99.3 F (37.4 C)] 98.7 F (37.1 C) (09/05 0752) Pulse Rate:  [110-116] 110 (09/05 0752) Resp:  [19] 19 (09/05 0752) BP: (124-137)/(88-101) 133/94 (09/05 0752) SpO2:  [99 %] 99 % (09/05 0752) Weight:  [122.5 kg-128.7 kg] 128.7 kg (09/05 0543)    Weight change: Filed Weights   09/16/23 2342 09/17/23 0543  Weight: 122.5 kg 128.7 kg    Intake/Output:   Intake/Output Summary (Last 24 hours) at 09/17/2023 1008 Last data filed at 09/17/2023 0700 Gross per 24 hour  Intake 354 ml  Output 1050 ml  Net -696 ml      Physical Exam    General:   No resp difficulty Neck: supple. JVP to jaw. Carotids 2+ bilat; no bruits. No lymphadenopathy or thyromegaly appreciated. Cor: PMI nondisplaced. Tachy Regular rate & rhythm. No rubs, gallops or murmurs. Lungs: clear Abdomen: soft, nontender, nondistended. NGood bowel sounds. Extremities: no cyanosis, clubbing, rash, R and LLE 1+ edema Neuro: alert & orientedx3, cranial nerves grossly intact. moves all 4 extremities w/o difficulty. Affect pleasant   Telemetry    ST 100s   EKG    ST  Labs   Basic Metabolic Panel: Recent Labs  Lab 09/16/23 2353 09/17/23 0005  NA 134* 136  K 3.9 4.2  CL 100 102  CO2 22  --   GLUCOSE 122* 119*  BUN 22* 24*  CREATININE 1.86* 2.00*  CALCIUM 9.2  --     Liver Function Tests: Recent Labs  Lab 09/16/23 2353  AST 45*  ALT 34  ALKPHOS 47  BILITOT 5.1*  PROT 7.5  ALBUMIN 3.5   No results for input(s): LIPASE, AMYLASE in the last 168 hours. No results for input(s): AMMONIA in the last 168 hours.  CBC: Recent Labs  Lab 09/16/23 2353 09/17/23 0005  WBC 10.8*  --   HGB 11.8* 12.9*  HCT 36.8* 38.0*  MCV 87.6  --   PLT 247  --     Cardiac Enzymes: No results for input(s): CKTOTAL, CKMB, CKMBINDEX, TROPONINI in the last 168 hours.  BNP: BNP (last 3 results) Recent Labs    06/21/23 1509 07/20/23 1154 09/16/23 2353  BNP 673.5* 925.3* 2,277.9*    ProBNP (last 3 results) No results for input(s): PROBNP in the last 8760 hours.   CBG: No results for input(s): GLUCAP in the last 168 hours.  Coagulation Studies: No results for input(s): LABPROT, INR in the last 72 hours.   Imaging   US  Abdomen Limited RUQ (LIVER/GB) Result Date: 09/17/2023 CLINICAL DATA:  Unspecified abdominal pain EXAM: ULTRASOUND ABDOMEN LIMITED RIGHT UPPER QUADRANT COMPARISON:  None Available. FINDINGS: Gallbladder: The gallbladder is largely decompressed. Apparent pericholecystic fluid likely represents the decompressed, thickened gallbladder wall best appreciated on image # 58. No gallstones or abnormal wall thickening visualized. No sonographic Murphy sign noted by sonographer. Common bile duct: Diameter: Less than 1 mm (completely decompressed) Liver: No focal lesion identified. Within normal limits in parenchymal echogenicity. Portal vein is patent on color Doppler imaging with normal direction of blood flow towards the liver. Other: None. IMPRESSION: 1. Normal right upper quadrant sonogram.  Electronically Signed   By: Dorethia Molt M.D.   On: 09/17/2023 01:30   DG  Chest 2 View Result Date: 09/17/2023 CLINICAL DATA:  Dyspnea EXAM: CHEST - 2 VIEW COMPARISON:  06/10/2023 FINDINGS: Stable reticulonodular opacity within the right costophrenic angle, likely reflecting parenchymal scarring given stability over time. Lungs are otherwise clear. No pneumothorax or pleural effusion. Mild cardiomegaly is stable. Pulmonary vascularity is normal. No acute bone abnormality. IMPRESSION: 1. No active cardiopulmonary disease. 2. Stable mild cardiomegaly. Electronically Signed   By: Dorethia Molt M.D.   On: 09/17/2023 00:21     Medications:     Current Medications:  apixaban   5 mg Oral BID   sodium chloride  flush  3 mL Intravenous Q12H    Infusions:  sodium chloride      furosemide         Patient Profile   Robert Daniels is a 60 year with a history of chronic HFrEF, NICM, tobacco abuse, obesity, and cocaine abuse.   Admitted with A/C Biventricular HF.  Assessment/Plan  1. A/C Biventricular HFrEF , NICM  Initially diagnosed with HFrEF in 2023. Most recent Echo was [facility] July 2025 --LVEF 15% RV severely reduced.  July 2023 CPX completed - moderate -severe limitation due to HF and obesity. VO2 19 when adjusted  to ideal body weight.  I am concerned he has low output HF given presentation. Place PICC and check CO-OX. IF CO-OX low will need to add inotropes. Follow CVP BNP 2277. Lactic acid initially 2.8 --> 1.5. CO2 21.  On exam he is volume overloaded. Poor response to 40 mg IV lasix   and 80 mg IV lasix . Will give 80 mg IV lasix  now and start lasix  drip 15 mg per hour.  - No bb with suspected low output. - Dig level in process.   - GDMT limited by CKD. - Add jardiance  10 mg daily  - Follow renal function .  - Concerned he will need  advanced therapies. Currently not a candidate for transplant with tobacco use. We discussed smoking cessation. Needs to avoid nicotine  6 months for  consideration.  He was seen by VAD coordinators in June for possible VAD. RV dysfunction may preclude VAD. Suspect he will need RHC once diuresed.   2. AKI  Baseline creatinine ~ 1.3-1.4. On admit 1.9.  ? Concerned he may have low output . Place PICC and will consider adding inotropes.   3. Severe Sleep Apnea AHI 54 Desaturation 78%, CPAP was recommended.  Outpatient Pulmonary working on CPAP.  4. Tobacco Abuse Recently started smoking again.  Discussed cessation. .  5. LV Thrombus  He has been on eliquis  for possible LV thrombus noted 02/2023. Continue eliquis  5 mg twice  6. Obesity Body mass index is 38.48 kg/m.     Length of Stay: 0  Greig Mosses, NP  09/17/2023, 10:08 AM    Advanced Heart Failure Team Pager [phone] (M-F; 7a - 5p)  Please contact [facility] for night-coverage after hours (4p -7a ) and weekends on [email or web address]  Patient seen and examined with the above-signed Advanced Practice Provider and/or Housestaff. I personally reviewed laboratory data, imaging studies and relevant notes. I independently examined the patient and formulated the important aspects of the plan. I have edited the note to reflect any of my changes or salient points. I have personally discussed the plan with the patient and/or family.  Robert. Daniels is a 38 year old male with morbid obesity, severe sleep apnea, tobacco use and advanced systolic heart failure with biventricular dysfunction.  He has had several recent admissions for decompensated heart failure both here and at [facility].  He reported to the emergency room complaining of 1 week history of progressive heart failure symptoms not responding to increases in his oral diuretics.  He does say he has been drinking a lot of fluids.  He reports taking his other heart failure medications.  He has now has class IV symptoms including dyspnea at rest orthopnea, PND and poor appetite.  Since being in the hospital he has been treated with Lasix  40 mg  IV minimal to no response.  Creatinine has been worsening from 1.4-1.9.  He says his weight is up about 15 pounds from baseline.  Unfortunately he began smoking again and has been smoking several cigarettes a day.  His BMI is 38.5 in his volume overloaded state.  He is sitting straight up in bed he appears comfortable HEENT is normal Neck is supple JVP to ear. Cardiac tachycardic and regular.  Plus S3. Lungs clear Abdomen obese nontender nondistended good bowel sounds. Extremities are cool with 3+ edema. Neuro he is alert and oriented moves all 4 extremities out difficulty cranial nerves intact.  He appears to have low output heart failure volume overload and diuretic resistance.  I suspect he will need inotropic support.  Will place PICC and check Co. oximetry and CVP.  Increase Lasix .  I think he is getting near the point where he will need advanced therapies.  Unfortunately he is not an LVAD candidate at this time due to severe RV dysfunction.  He likely also is not a candidate for transplant given his ongoing tobacco use and current BMI though I suspect his BMI will dip under 35 with diuresis.  I stressed the need for smoking cessation.  The heart failure team will follow closely throughout the hospitalization.  Toribio Fuel, MD  3:00 PM

## 2023-09-17 NOTE — ED Notes (Signed)
 Robert Daniels, NT taking pt up to floor at this time

## 2023-09-17 NOTE — Hospital Course (Addendum)
 Robert Daniels was admitted to the hospital with the working diagnosis of heart failure exacerbation.   38 yo male with the past medical history of heart failure, LV thrombus, CKD, substance abuse and obesity who presented with dyspnea.  08/08 outpatient follow up with heart failure clinic, he was found euvolemic, he was placed on entresto  and torsemide  was decreased from 80 to 40 mg daily    Reported worsening dyspnea for the last 3 days and about 15 lbs weight gain overt the last 48 hrs prior to admission.  On his initial physical examination his blood pressure was 137/101, HR 116, RR 19 and 02 saturation 99%  Lungs with bilateral rales with no wheezing, heart with S1 and S2 present and regular, no gallops, abdomen with no distention and positive lower extremity edema.   Na 134, K 3,9 Cl 100, bicarbonate 22, glucose 122 bun 22 cr 1,86  AST 45 and ALT 34, total bilirubin 5.1 (indirect 4.6)  BNP 2,277  High sensitive troponin 53 and 58 Lactic acid 2,8 and 2,5  Wbc 10,8 hgb 11.8 plt 247   Sars COVID 19 negative Influenza negative RSV negative   Chest radiograph with cardiomegaly, bilateral hilar vascular congestion and bilateral cental interstitial infiltrates, fluid in the right fissure, no effusions.   EKG 113 bpm, normal axis, normal intervals, qtc 466, sinus rhythm with bilateral atrial enlargement, poor RR wave progression, no ST segment changes, negative T waves lead I, aVL, V5-V6.   09/06 patient responding well to aggressive diuresis.  09/07 improved volume status, stopped furosemide , plan for cardiac catheterization in am.  09/08 cardiac catheterization with elevated filling pressures.  09/09 continue diuresis.

## 2023-09-17 NOTE — ED Notes (Signed)
 CCMD contacted for pt monitoring. Monitoring successful

## 2023-09-17 NOTE — Progress Notes (Signed)
 Heart Failure Navigator Progress Note  Assessed for Heart & Vascular TOC clinic readiness.  Patient does not meet criteria due to Advanced Heart Failure Team patient of Dr. Gala Romney. .   Navigator will sign off at this time.   Rhae Hammock, BSN, Scientist, clinical (histocompatibility and immunogenetics) Only

## 2023-09-17 NOTE — Progress Notes (Signed)
 Progress Note   Patient: Robert Daniels FMW:[id number] DOB: 01-Aug-1985 DOA: 09/16/2023     0 DOS: the patient was seen and examined on 09/17/2023   Brief hospital course: Mr. Keys was admitted to the hospital with the working diagnosis of heart failure exacerbation.   38 yo male with the past medical history of heart failure, LV thrombus, CKD, substance abuse and obesity who presented with dyspnea.  08/08 outpatient follow up with heart failure clinic, he was found euvolemic, he was placed on entresto  and torsemide  was decreased from 80 to 40 mg daily    Reported worsening dyspnea for the last 3 days and about 15 lbs weight gain overt the last 48 hrs prior to admission.  On his initial physical examination his blood pressure was 137/101, HR 116, RR 19 and 02 saturation 99%  Lungs with bilateral rales with no wheezing, heart with S1 and S2 present and regular, no gallops, abdomen with no distention and positive lower extremity edema.   Na 134, K 3,9 Cl 100, bicarbonate 22, glucose 122 bun 22 cr 1,86  AST 45 and ALT 34, total bilirubin 5.1 (indirect 4.6)  BNP 2,277  High sensitive troponin 53 and 58 Lactic acid 2,8 and 2,5  Wbc 10,8 hgb 11.8 plt 247   Sars COVID 19 negative Influenza negative RSV negative   Chest radiograph with cardiomegaly, bilateral hilar vascular congestion and bilateral cental interstitial infiltrates, fluid in the right fissure, no effusions.   EKG 113 bpm, normal axis, normal intervals, qtc 466, sinus rhythm with bilateral atrial enlargement, poor RR wave progression, no ST segment changes, negative T waves lead I, aVL, V5-V6.    Assessment and Plan: * Acute on chronic systolic CHF (congestive heart failure) (HCC) Echocardiogram from 02/ 2025, reduced LV systolic function to 15%, with global hypokinesis, severe LV dilatation, moderate RV systolic function reduction, RVSP 29.7 mmHg, LA and RA with mild dilatation, no pericardial effusion.  Documented urine  output is 1,050 ml Systolic blood pressure 120 mmHg range.  Continue volume overloaded.  Repeat lactic acid is down to 1,9 from 2.5  Lower extremities are warm.   Plan to continue aggressive diuresis with furosemide  drip at 15 mg per hr.  Add milrinone  for inotropic support.  Continue SGLT 2 inh.  Follow Sv02.   HTN (hypertension) Continue blood pressure monitoring Patient on milrinone  and furosemide .   CKD (chronic kidney disease) stage 2, GFR 60-89 ml/min Hyponatremia.   Renal function with serum cr at 1,91 with K at 3,6 and serum bicarbonate at 21  Na 133   Add Kcl to prevent hypokalemia Follow up renal function and electrolytes Avoid hypotension and nephrotoxic agents.   History of substance use disorder Check UDS  Obesity, class 2 Calculated BMI is 38,4         Subjective: Patient continue to have dyspnea and edema, not much urine output since admission   Physical Exam: Vitals:   09/16/23 2337 09/16/23 2342 09/17/23 0543  BP: (!) 137/101  124/88  Pulse: (!) 116    Resp: 19    Temp: 99.3 F (37.4 C)  98.8 F (37.1 C)  TempSrc:   Oral  SpO2: 99%  99%  Weight:  122.5 kg 128.7 kg  Height:  6' (1.829 m) 6' (1.829 m)   Neurology awake and alert, deconditioned ENT with no pallor or icterus Cardiovascular with S1 and S2 present and regular with no gallops or rubs, no murmurs Positive JVD Respiratory with rales at bases with  no wheezing or rhonchi  Abdomen with mild distention, non tender and soft Positive lower extremity edema +++ bilaterally  Data Reviewed:    Family Communication: no family at the bedside   Disposition: Status is: Inpatient Remains inpatient appropriate because: heart failure   Planned Discharge Destination: Home     Author: Elidia Toribio Furnace, MD 09/17/2023 7:50 AM  For on call review [email or web address].

## 2023-09-17 NOTE — Assessment & Plan Note (Addendum)
 Echocardiogram from 02/ 2025, reduced LV systolic function to 15%, with global hypokinesis, severe LV dilatation, moderate RV systolic function reduction, RVSP 29.7 mmHg, LA and RA with mild dilatation, no pericardial effusion.  Urine output is 6,150 ml Systolic blood pressure  90 to 100  mmHg range.  Sv02 51   Continue furosemide  drip at 15 mg per hr.  Milrinone  for inotropic support 0.25 mcg/kg/min  Continue SGLT 2 inh and added digoxin  Resume spironolactone  in am.   LV thrombus, continue with apixaban  for anticoagulation.

## 2023-09-17 NOTE — Assessment & Plan Note (Addendum)
 Toxicology scree negative

## 2023-09-17 NOTE — ED Provider Notes (Signed)
 [facility] Provider Note   CSN: [id number] Arrival date & time: 09/16/23  2332     History Chief Complaint  Patient presents with   Shortness of Breath   Abdominal Robert Daniels    HPI Robert Robert Daniels is a 38 y.o. male presenting for chief complaint of abdominal/epigastic Robert Daniels. States that he has a HX If CHF on medications Compliant on torsemide  40 daily but was recently changed down from 80 Recently admitted for similar shortness of breath found to be heart failure exacerbation.  Patient's recorded medical, surgical, social, medication list and allergies were reviewed in the Snapshot window as part of the initial history.   Review of Systems   Review of Systems  Constitutional:  Negative for chills and fever.  HENT:  Negative for ear Robert Daniels and sore throat.   Eyes:  Negative for Robert Daniels and visual disturbance.  Respiratory:  Positive for cough and shortness of breath.   Cardiovascular:  Negative for chest Robert Daniels and palpitations.  Gastrointestinal:  Negative for abdominal Robert Daniels and vomiting.  Genitourinary:  Negative for dysuria and hematuria.  Musculoskeletal:  Negative for arthralgias and back Robert Daniels.  Skin:  Positive for color change. Negative for rash.  Neurological:  Positive for weakness. Negative for seizures and syncope.  All other systems reviewed and are negative.   Physical Exam Updated Vital Signs BP (!) 137/101 (BP Location: Right Arm)   Pulse (!) 116   Temp 99.3 F (37.4 C)   Resp 19   Ht 6' (1.829 m)   Wt 122.5 kg   SpO2 99%   BMI 36.62 kg/m  Physical Exam Vitals and nursing note reviewed.  Constitutional:      General: He is not in acute distress.    Appearance: He is well-developed.  HENT:     Head: Normocephalic and atraumatic.  Eyes:     Conjunctiva/sclera: Conjunctivae normal.  Cardiovascular:     Rate and Rhythm: Normal rate and regular rhythm.     Heart sounds: No murmur heard. Pulmonary:     Effort:  Pulmonary effort is normal. No respiratory distress.     Breath sounds: Normal breath sounds.  Abdominal:     Palpations: Abdomen is soft.     Tenderness: There is no abdominal tenderness.  Musculoskeletal:        General: No swelling.     Cervical back: Neck supple.  Skin:    General: Skin is warm and dry.     Capillary Refill: Capillary refill takes less than 2 seconds.  Neurological:     Mental Status: He is alert.  Psychiatric:        Mood and Affect: Mood normal.      ED Course/ Medical Decision Making/ A&P    Procedures Procedures   Medications Ordered in ED Medications  furosemide  (LASIX ) injection 80 mg (80 mg Intravenous Given 09/17/23 0151)   Medical Decision Making:   Robert Robert Daniels is a 38 y.o. male with a history of CHF, who presented to the ED today with acute on chronic SOB. They are endorsing worsening of their baseline dyspnea over the past 48 hours. Their baseline is a 0L O2 requirement. At their baseline they are able to get around the neighborhood and they are not able to at this time.   On my initial exam, the pt was SOB and tachypneic. LE edema present.  They are not endorsing cough/fever/sputum production.    Reviewed and confirmed nursing documentation for  past medical history, family history, social history.    Initial Assessment:   With the patient's presentation of SOB in the above setting, most likely diagnosis is CHF Exacerbation. Other diagnoses were considered including (but not limited to) CAP, PE, ACS, viral infection, PTX. These are considered less likely due to history of present illness and physical exam findings.   This is most consistent with an acute life/limb threatening illness complicated by underlying chronic conditions.  Initial Plan:  Empiric treatment of patient's symptoms with oxygen administration. EKG/Troponin testing/BNP testing to evaluate for cardiac pathology. Evaluation for infectious versus intrathoracic abnormality  with chest x-ray  Evaluation for volume overload with BNP  Screening labs including CBC and Metabolic panel to evaluate for infectious or metabolic etiology of disease.  Patient's Wells score is low and patient does not warrant further objective evaluation for PE based on consistency of presentation of alternative diagnosis.  Objective evaluation as below reviewed   Initial Study Results:   Laboratory  All laboratory results reviewed without evidence of clinically relevant pathology.    EKG EKG was reviewed independently. Rate, rhythm, axis, intervals all examined and without medically relevant abnormality. ST segments without concerns for elevations.    Radiology:  All images reviewed independently. Agree with radiology report at this time.   US  Abdomen Limited RUQ (LIVER/GB) Result Date: 09/17/2023 CLINICAL DATA:  Unspecified abdominal Robert Daniels EXAM: ULTRASOUND ABDOMEN LIMITED RIGHT UPPER QUADRANT COMPARISON:  None Available. FINDINGS: Gallbladder: The gallbladder is largely decompressed. Apparent pericholecystic fluid likely represents the decompressed, thickened gallbladder wall best appreciated on image # 58. No gallstones or abnormal wall thickening visualized. No sonographic Murphy sign noted by sonographer. Common bile duct: Diameter: Less than 1 mm (completely decompressed) Liver: No focal lesion identified. Within normal limits in parenchymal echogenicity. Portal vein is patent on color Doppler imaging with normal direction of blood flow towards the liver. Other: None. IMPRESSION: 1. Normal right upper quadrant sonogram. Electronically Signed   By: Dorethia Molt M.D.   On: 09/17/2023 01:30   DG Chest 2 View Result Date: 09/17/2023 CLINICAL DATA:  Dyspnea EXAM: CHEST - 2 VIEW COMPARISON:  06/10/2023 FINDINGS: Stable reticulonodular opacity within the right costophrenic angle, likely reflecting parenchymal scarring given stability over time. Lungs are otherwise clear. No pneumothorax or pleural  effusion. Mild cardiomegaly is stable. Pulmonary vascularity is normal. No acute bone abnormality. IMPRESSION: 1. No active cardiopulmonary disease. 2. Stable mild cardiomegaly. Electronically Signed   By: Dorethia Molt M.D.   On: 09/17/2023 00:21      Consults: Case discussed with hospitalitsts.   Final Assessment and Plan:   She was initiated on diureses above typical dose. Reassessed after initiation of medical therapies, patient is grossly improved and no longer in acute distress.    Given the advanced nature of the patient's presentation, patient will require advanced care and admission was arranged.      Clinical Impression:  1. SOB (shortness of breath)      Admit   Final Clinical Impression(s) / ED Diagnoses Final diagnoses:  SOB (shortness of breath)    Rx / DC Orders ED Discharge Orders     None         Jerral Meth, MD 09/17/23 [phone]

## 2023-09-17 NOTE — Assessment & Plan Note (Addendum)
 Hyponatremia. Hypokalemia   Improved volume status, renal function toady with serum cr at 1,94 with K at 3,2 and serum bicarbonate at 23  Na 135 Mg 1.8   Continue K correction with Kcl 40 meq x2  Add 2 g mag sulfate.  Follow up renal function and electrolytes in am. Avoid hypotension and nephrotoxic medications.

## 2023-09-18 DIAGNOSIS — Z87898 Personal history of other specified conditions: Secondary | ICD-10-CM | POA: Diagnosis not present

## 2023-09-18 DIAGNOSIS — N182 Chronic kidney disease, stage 2 (mild): Secondary | ICD-10-CM | POA: Diagnosis not present

## 2023-09-18 DIAGNOSIS — I10 Essential (primary) hypertension: Secondary | ICD-10-CM | POA: Diagnosis not present

## 2023-09-18 DIAGNOSIS — I5023 Acute on chronic systolic (congestive) heart failure: Secondary | ICD-10-CM | POA: Diagnosis not present

## 2023-09-18 LAB — CBC
HCT: 35.2 % — ABNORMAL LOW (ref 39.0–52.0)
Hemoglobin: 11.8 g/dL — ABNORMAL LOW (ref 13.0–17.0)
MCH: 28.6 pg (ref 26.0–34.0)
MCHC: 33.5 g/dL (ref 30.0–36.0)
MCV: 85.2 fL (ref 80.0–100.0)
Platelets: 245 K/uL (ref 150–400)
RBC: 4.13 MIL/uL — ABNORMAL LOW (ref 4.22–5.81)
RDW: 16.4 % — ABNORMAL HIGH (ref 11.5–15.5)
WBC: 13.2 K/uL — ABNORMAL HIGH (ref 4.0–10.5)
nRBC: 0 % (ref 0.0–0.2)

## 2023-09-18 LAB — COOXEMETRY PANEL
Carboxyhemoglobin: 1.2 % (ref 0.5–1.5)
Methemoglobin: 0.8 % (ref 0.0–1.5)
O2 Saturation: 51 %
Total hemoglobin: 11.9 g/dL — ABNORMAL LOW (ref 12.0–16.0)

## 2023-09-18 LAB — BASIC METABOLIC PANEL WITH GFR
Anion gap: 13 (ref 5–15)
BUN: 20 mg/dL (ref 6–20)
CO2: 23 mmol/L (ref 22–32)
Calcium: 8.5 mg/dL — ABNORMAL LOW (ref 8.9–10.3)
Chloride: 99 mmol/L (ref 98–111)
Creatinine, Ser: 1.94 mg/dL — ABNORMAL HIGH (ref 0.61–1.24)
GFR, Estimated: 45 mL/min — ABNORMAL LOW (ref >60)
Glucose, Bld: 143 mg/dL — ABNORMAL HIGH (ref 70–99)
Potassium: 3.2 mmol/L — ABNORMAL LOW (ref 3.5–5.1)
Sodium: 135 mmol/L (ref 135–145)

## 2023-09-18 LAB — RAPID URINE DRUG SCREEN, HOSP PERFORMED
Amphetamines: NOT DETECTED
Barbiturates: NOT DETECTED
Benzodiazepines: NOT DETECTED
Cocaine: NOT DETECTED
Opiates: NOT DETECTED
Tetrahydrocannabinol: NOT DETECTED

## 2023-09-18 LAB — HEPATIC FUNCTION PANEL
ALT: 77 U/L — ABNORMAL HIGH (ref 0–44)
AST: 87 U/L — ABNORMAL HIGH (ref 15–41)
Albumin: 3.4 g/dL — ABNORMAL LOW (ref 3.5–5.0)
Alkaline Phosphatase: 45 U/L (ref 38–126)
Bilirubin, Direct: 0.7 mg/dL — ABNORMAL HIGH (ref 0.0–0.2)
Indirect Bilirubin: 4.3 mg/dL — ABNORMAL HIGH (ref 0.3–0.9)
Total Bilirubin: 5 mg/dL — ABNORMAL HIGH (ref 0.0–1.2)
Total Protein: 7.5 g/dL (ref 6.5–8.1)

## 2023-09-18 LAB — MAGNESIUM: Magnesium: 1.8 mg/dL (ref 1.7–2.4)

## 2023-09-18 MED ORDER — SPIRONOLACTONE 12.5 MG HALF TABLET
12.5000 mg | ORAL_TABLET | Freq: Every day | ORAL | Status: DC
  Administered 2023-09-19 – 2023-09-21 (×3): 12.5 mg via ORAL
  Filled 2023-09-18 (×4): qty 1

## 2023-09-18 MED ORDER — MAGNESIUM SULFATE 2 GM/50ML IV SOLN
2.0000 g | Freq: Once | INTRAVENOUS | Status: AC
  Administered 2023-09-18: 2 g via INTRAVENOUS
  Filled 2023-09-18: qty 50

## 2023-09-18 MED ORDER — POTASSIUM CHLORIDE CRYS ER 20 MEQ PO TBCR
40.0000 meq | EXTENDED_RELEASE_TABLET | ORAL | Status: AC
  Administered 2023-09-18 (×2): 40 meq via ORAL
  Filled 2023-09-18 (×2): qty 2

## 2023-09-18 MED ORDER — DIGOXIN 125 MCG PO TABS
0.0625 mg | ORAL_TABLET | Freq: Every day | ORAL | Status: DC
  Administered 2023-09-18 – 2023-09-27 (×10): 0.0625 mg via ORAL
  Filled 2023-09-18 (×10): qty 1

## 2023-09-18 NOTE — Progress Notes (Signed)
 Advanced Heart Failure Rounding Note   Subjective:    Started on milrinone  0.25 yesterday for co-ox 38%. Co-ox 51% today.   Feels much better. Diuresing briskly out over 6L  CVP 12    Objective:   Weight Range:  Vital Signs:   Temp:  [99 F (37.2 C)-99.9 F (37.7 C)] 99.9 F (37.7 C) (09/06 0731) Pulse Rate:  [106-117] 106 (09/06 0731) Resp:  [19-20] 20 (09/06 0731) BP: (102-118)/(68-88) 111/76 (09/06 0731) SpO2:  [95 %-100 %] 95 % (09/06 0731) Weight:  [124.9 kg] 124.9 kg (09/06 0447) Last BM Date : 09/18/23  Weight change: Filed Weights   09/16/23 2342 09/17/23 0543 09/18/23 0447  Weight: 122.5 kg 128.7 kg 124.9 kg    Intake/Output:   Intake/Output Summary (Last 24 hours) at 09/18/2023 1114 Last data filed at 09/18/2023 0839 Gross per 24 hour  Intake 720.35 ml  Output 6750 ml  Net -6029.65 ml     Physical Exam: General:  obese male lying in bed. No resp difficulty HEENT: normal Neck: supple. JVP 10. Carotids 2+ bilat; no bruits. No lymphadenopathy or thryomegaly appreciated. Cor:Reg tachy  Lungs: clear Abdomen: obese soft, nontender, nondistended. No hepatosplenomegaly. No bruits or masses. Good bowel sounds. Extremities: no cyanosis, clubbing, rash, 1+ edema Neuro: alert & orientedx3, cranial nerves grossly intact. moves all 4 extremities w/o difficulty. Affect pleasant  Telemetry: Sinus tach 100-110 Discussed warfarin dosing with PharmD personally.   Labs: Basic Metabolic Panel: Recent Labs  Lab 09/16/23 2353 09/17/23 0005 09/17/23 0915 09/18/23 0237  NA 134* 136 133* 135  K 3.9 4.2 3.6 3.2*  CL 100 102 100 99  CO2 22  --  21* 23  GLUCOSE 122* 119* 122* 143*  BUN 22* 24* 23* 20  CREATININE 1.86* 2.00* 1.91* 1.94*  CALCIUM 9.2  --  8.7* 8.5*  MG  --   --   --  1.8    Liver Function Tests: Recent Labs  Lab 09/16/23 2353 09/18/23 0237  AST 45* 87*  ALT 34 77*  ALKPHOS 47 45  BILITOT 5.1* 5.0*  PROT 7.5 7.5  ALBUMIN 3.5 3.4*    No results for input(s): LIPASE, AMYLASE in the last 168 hours. No results for input(s): AMMONIA in the last 168 hours.  CBC: Recent Labs  Lab 09/16/23 2353 09/17/23 0005 09/18/23 0237  WBC 10.8*  --  13.2*  HGB 11.8* 12.9* 11.8*  HCT 36.8* 38.0* 35.2*  MCV 87.6  --  85.2  PLT 247  --  245    Cardiac Enzymes: No results for input(s): CKTOTAL, CKMB, CKMBINDEX, TROPONINI in the last 168 hours.  BNP: BNP (last 3 results) Recent Labs    06/21/23 1509 07/20/23 1154 09/16/23 2353  BNP 673.5* 925.3* 2,277.9*    ProBNP (last 3 results) No results for input(s): PROBNP in the last 8760 hours.    Other results:  Imaging: ECHOCARDIOGRAM COMPLETE Result Date: 09/17/2023    ECHOCARDIOGRAM REPORT   Patient Name:   Robert Daniels Date of Exam: 09/17/2023 Medical Rec #:  [id number]        Height:       72.0 in Accession #:    [id number]       Weight:       283.7 lb Date of Birth:  1986/01/06        BSA:          2.472 m Patient Age:    38 years  BP:           124/88 mmHg Patient Gender: M                HR:           109 bpm. Exam Location:  Inpatient Procedure: 2D Echo, Cardiac Doppler, Color Doppler and Intracardiac            Opacification Agent (Both Spectral and Color Flow Doppler were            utilized during procedure). Indications:    CHF-Acute Systolic I50.21  History:        Patient has prior history of Echocardiogram examinations, most                 recent 03/10/2023. CHF; Risk Factors:Hypertension.  Sonographer:    Jayson Gaskins Referring Phys: [id number] JOETTE PEBBLES IMPRESSIONS  1. Left ventricular ejection fraction, by estimation, is <20%. The left ventricle has severely decreased function. The left ventricle demonstrates global hypokinesis. The left ventricular internal cavity size was severely dilated. Left ventricular diastolic function could not be evaluated.  2. Right ventricular systolic function mild to moderately reduced. The right ventricular size  is moderately enlarged. There is mildly elevated pulmonary artery systolic pressure. The estimated right ventricular systolic pressure is 39.2 mmHg.  3. Left atrial size was mildly dilated.  4. Right atrial size was mildly dilated.  5. The mitral valve is grossly normal. Mild mitral valve regurgitation.  6. Tricuspid valve regurgitation is moderate to severe.  7. The aortic valve is tricuspid. Aortic valve regurgitation is mild. Aortic valve sclerosis is present, with no evidence of aortic valve stenosis.  8. The inferior vena cava is normal in size with greater than 50% respiratory variability, suggesting right atrial pressure of 3 mmHg. Comparison(s): A prior study was performed on 08/28/2021. LVEF 20-25%, severely dilated LV cavity, mild MR, estimated RAP 3 mmHg. Conclusion(s)/Recommendation(s): No left ventricular mural or apical thrombus/thrombi. FINDINGS  Left Ventricle: Left ventricular ejection fraction, by estimation, is <20%. The left ventricle has severely decreased function. The left ventricle demonstrates global hypokinesis. Definity  contrast agent was given IV to delineate the left ventricular endocardial borders. The left ventricular internal cavity size was severely dilated. There is no left ventricular hypertrophy. Left ventricular diastolic function could not be evaluated due to nondiagnostic images. Left ventricular diastolic function could not be evaluated. Right Ventricle: The right ventricular size is moderately enlarged. No increase in right ventricular wall thickness. Right ventricular systolic function mild to moderately reduced. There is mildly elevated pulmonary artery systolic pressure. The tricuspid regurgitant velocity is 3.01 m/s, and with an assumed right atrial pressure of 3 mmHg, the estimated right ventricular systolic pressure is 39.2 mmHg. Left Atrium: Left atrial size was mildly dilated. Right Atrium: Right atrial size was mildly dilated. Pericardium: There is no evidence of  pericardial effusion. Mitral Valve: The mitral valve is grossly normal. Mild mitral valve regurgitation. Tricuspid Valve: The tricuspid valve is grossly normal. Tricuspid valve regurgitation is moderate to severe. Aortic Valve: The aortic valve is tricuspid. Aortic valve regurgitation is mild. Aortic valve sclerosis is present, with no evidence of aortic valve stenosis. Aortic valve peak gradient measures 2.0 mmHg. Pulmonic Valve: The pulmonic valve was grossly normal. Pulmonic valve regurgitation is mild to moderate. No evidence of pulmonic stenosis. Aorta: The aortic root is normal in size and structure and the ascending aorta was not well visualized. Venous: The inferior vena cava is normal in size with greater than  50% respiratory variability, suggesting right atrial pressure of 3 mmHg. IAS/Shunts: The interatrial septum was not well visualized.  LEFT VENTRICLE PLAX 2D LVIDd:         7.91 cm LVIDs:         7.78 cm LV PW:         0.88 cm LV IVS:        0.91 cm LVOT diam:     2.00 cm LV SV:         32 LV SV Index:   13 LVOT Area:     3.14 cm  RIGHT VENTRICLE RV Basal diam:  5.64 cm RV Mid diam:    4.50 cm RV S prime:     8.59 cm/s TAPSE (M-mode): 1.3 cm LEFT ATRIUM              Index        RIGHT ATRIUM           Index LA Vol (A2C):   119.0 ml 48.14 ml/m  RA Area:     24.90 cm LA Vol (A4C):   82.1 ml  33.21 ml/m  RA Volume:   94.80 ml  38.35 ml/m LA Biplane Vol: 99.3 ml  40.17 ml/m  AORTIC VALVE AV Area (Vmax): 3.30 cm AV Vmax:        71.30 cm/s AV Peak Grad:   2.0 mmHg LVOT Vmax:      75.00 cm/s LVOT Vmean:     54.100 cm/s LVOT VTI:       0.102 m  AORTA Ao Root diam: 3.32 cm MITRAL VALVE               TRICUSPID VALVE MV Area (PHT): 4.26 cm    TR Peak grad:   36.2 mmHg MV Decel Time: 178 msec    TR Vmax:        301.00 cm/s MV E velocity: 76.50 cm/s                            SHUNTS                            Systemic VTI:  0.10 m                            Systemic Diam: 2.00 cm Sunit Tolia Electronically  signed by Madonna Large Signature Date/Time: 09/17/2023/5:41:34 PM    Final    US  EKG SITE RITE Result Date: 09/17/2023 If Site Rite image not attached, placement could not be confirmed due to current cardiac rhythm.  US  Abdomen Limited RUQ (LIVER/GB) Result Date: 09/17/2023 CLINICAL DATA:  Unspecified abdominal pain EXAM: ULTRASOUND ABDOMEN LIMITED RIGHT UPPER QUADRANT COMPARISON:  None Available. FINDINGS: Gallbladder: The gallbladder is largely decompressed. Apparent pericholecystic fluid likely represents the decompressed, thickened gallbladder wall best appreciated on image # 58. No gallstones or abnormal wall thickening visualized. No sonographic Murphy sign noted by sonographer. Common bile duct: Diameter: Less than 1 mm (completely decompressed) Liver: No focal lesion identified. Within normal limits in parenchymal echogenicity. Portal vein is patent on color Doppler imaging with normal direction of blood flow towards the liver. Other: None. IMPRESSION: 1. Normal right upper quadrant sonogram. Electronically Signed   By: Dorethia Molt M.D.   On: 09/17/2023 01:30   DG Chest 2 View Result Date: 09/17/2023 CLINICAL DATA:  Dyspnea  EXAM: CHEST - 2 VIEW COMPARISON:  06/10/2023 FINDINGS: Stable reticulonodular opacity within the right costophrenic angle, likely reflecting parenchymal scarring given stability over time. Lungs are otherwise clear. No pneumothorax or pleural effusion. Mild cardiomegaly is stable. Pulmonary vascularity is normal. No acute bone abnormality. IMPRESSION: 1. No active cardiopulmonary disease. 2. Stable mild cardiomegaly. Electronically Signed   By: Dorethia Molt M.D.   On: 09/17/2023 00:21     Medications:     Scheduled Medications:  apixaban   5 mg Oral BID   Chlorhexidine  Gluconate Cloth  6 each Topical Daily   empagliflozin   10 mg Oral Daily   polyethylene glycol  17 g Oral Daily   potassium chloride   40 mEq Oral Q4H   sodium chloride  flush  10-40 mL Intracatheter Q12H    sodium chloride  flush  3 mL Intravenous Q12H    Infusions:  furosemide  (LASIX ) 200 mg in dextrose  5 % 100 mL (2 mg/mL) infusion 15 mg/hr (09/18/23 1017)   milrinone  0.25 mcg/kg/min (09/18/23 0000)    PRN Medications: acetaminophen , ondansetron  (ZOFRAN ) IV, sodium chloride  flush, sodium chloride  flush   Assessment:   1. A/C Biventricular HFrEF , NICM  - Initially diagnosed with HFrEF in 2023.  Echo [facility] July 2025 --LVEF 15% RV severely reduced.  - July 2023 CPX VO2 12.7 (43%) 19 when adjusted  to ideal body weight. Slope 42 - Low output on admit Coox 38%  - Now on milrinone  0.25  Coox still low at 51%. Will continue current dose - CVP 12 Continue IV lasix  - No bb with suspected low output. - Restart digoxin  0.125 - Continue  jardiance  10 mg daily  - Add spiro 12.5 - Follow renal function .  - Long talk about need and candidacy for advanced therapies. Currently not a candidate for transplant with tobacco use. We discussed smoking cessation. Needs to avoid nicotine  6 months for consideration.  He was seen by VAD coordinators in June for possible VAD. RV dysfunction may preclude VAD. Will need RHC once diuresed. Discussed directly that he needs to be more engaged in his own care to be candidate for advanced therapies   2. AKI due to cardiorenal syndrome Baseline creatinine ~ 1.3-1.4. On admit 1.9.  - scr stable 1.9 today   3. Severe Sleep Apnea AHI 54 Desaturation 78%, CPAP was recommended.  Outpatient Pulmonary working on CPAP.   4. Tobacco Abuse Recently started smoking again.  Discussed cessation as above   5. LV Thrombus  He has been on eliquis  for possible LV thrombus noted 02/2023. Continue eliquis  5 mg twice   6. Obesity - Body mass index is 37.34 kg/m. - requesting a Nutrition consult. Will call Monday  7. Hypokalemia - add spiro 12.5 - supp  Length of Stay: 1   Calistro Rauf MD 09/18/2023, 11:14 AM  Advanced Heart Failure Team Pager [phone] (M-F; 7a  - 4p)  Please contact [facility] for night-coverage after hours (4p -7a ) and weekends on [email or web address]

## 2023-09-18 NOTE — Progress Notes (Signed)
 Progress Note   Patient: Robert Daniels FMW:[id number] DOB: [birth year] DOA: 09/16/2023     1 DOS: the patient was seen and examined on 09/18/2023   Brief hospital course: Mr. Victory was admitted to the hospital with the working diagnosis of heart failure exacerbation.   38 yo male with the past medical history of heart failure, LV thrombus, CKD, substance abuse and obesity who presented with dyspnea.  08/08 outpatient follow up with heart failure clinic, he was found euvolemic, he was placed on entresto  and torsemide  was decreased from 80 to 40 mg daily    Reported worsening dyspnea for the last 3 days and about 15 lbs weight gain overt the last 48 hrs prior to admission.  On his initial physical examination his blood pressure was 137/101, HR 116, RR 19 and 02 saturation 99%  Lungs with bilateral rales with no wheezing, heart with S1 and S2 present and regular, no gallops, abdomen with no distention and positive lower extremity edema.   Na 134, K 3,9 Cl 100, bicarbonate 22, glucose 122 bun 22 cr 1,86  AST 45 and ALT 34, total bilirubin 5.1 (indirect 4.6)  BNP 2,277  High sensitive troponin 53 and 58 Lactic acid 2,8 and 2,5  Wbc 10,8 hgb 11.8 plt 247   Sars COVID 19 negative Influenza negative RSV negative   Chest radiograph with cardiomegaly, bilateral hilar vascular congestion and bilateral cental interstitial infiltrates, fluid in the right fissure, no effusions.   EKG 113 bpm, normal axis, normal intervals, qtc 466, sinus rhythm with bilateral atrial enlargement, poor RR wave progression, no ST segment changes, negative T waves lead I, aVL, V5-V6.   09/06 patient responding well to aggressive diuresis.   Assessment and Plan: * Acute on chronic systolic CHF (congestive heart failure) (HCC) Echocardiogram from 02/ 2025, reduced LV systolic function to 15%, with global hypokinesis, severe LV dilatation, moderate RV systolic function reduction, RVSP 29.7 mmHg, LA and RA with mild  dilatation, no pericardial effusion.  Urine output is 6,150 ml Systolic blood pressure  90 to 100  mmHg range.  Sv02 51   Continue furosemide  drip at 15 mg per hr.  Milrinone  for inotropic support 0.25 mcg/kg/min  Continue SGLT 2 inh and added digoxin  Resume spironolactone  in am.   LV thrombus, continue with apixaban  for anticoagulation.   HTN (hypertension) Continue blood pressure monitoring Patient on milrinone  and furosemide .   CKD (chronic kidney disease) stage 2, GFR 60-89 ml/min Hyponatremia. Hypokalemia   Improved volume status, renal function toady with serum cr at 1,94 with K at 3,2 and serum bicarbonate at 23  Na 135 Mg 1.8   Continue K correction with Kcl 40 meq x2  Add 2 g mag sulfate.  Follow up renal function and electrolytes in am. Avoid hypotension and nephrotoxic medications.   History of substance use disorder Toxicology scree negative   Obesity, class 2 Calculated BMI is 38,4      Subjective: Patient with improvement in dyspnea and lower extremity edema, no PND or orthopnea, no chest pain   Physical Exam: Vitals:   09/18/23 0400 09/18/23 0447 09/18/23 0731 09/18/23 1324  BP:  106/72 111/76 90/69  Pulse:  (!) 117 (!) 106 (!) 109  Resp:   20 20  Temp: 99.4 F (37.4 C) 99.4 F (37.4 C) 99.9 F (37.7 C) 99.7 F (37.6 C)  TempSrc:  Oral Oral Oral  SpO2:  100% 95% 98%  Weight:  124.9 kg    Height:  Neurology awake and alert ENT with no pallor Cardiovascular with S1 and S2 present and regular with no gallops or rubs, no murmurs No JVD Respiratory with mild rales at bases with no wheezing or rhonchi  Abdomen with no distention, soft and non tender Positive lower extremity edema +  Data Reviewed:    Family Communication: family at the bedside   Disposition: Status is: Inpatient Remains inpatient appropriate because: recovering heart failure   Planned Discharge Destination: Home     Author: Elidia Toribio Furnace, MD 09/18/2023  5:42 PM  For on call review [email or web address].

## 2023-09-19 DIAGNOSIS — I5023 Acute on chronic systolic (congestive) heart failure: Secondary | ICD-10-CM | POA: Diagnosis not present

## 2023-09-19 DIAGNOSIS — N182 Chronic kidney disease, stage 2 (mild): Secondary | ICD-10-CM | POA: Diagnosis not present

## 2023-09-19 DIAGNOSIS — I10 Essential (primary) hypertension: Secondary | ICD-10-CM | POA: Diagnosis not present

## 2023-09-19 DIAGNOSIS — Z87898 Personal history of other specified conditions: Secondary | ICD-10-CM | POA: Diagnosis not present

## 2023-09-19 LAB — BASIC METABOLIC PANEL WITH GFR
Anion gap: 11 (ref 5–15)
Anion gap: 12 (ref 5–15)
BUN: 14 mg/dL (ref 6–20)
BUN: 16 mg/dL (ref 6–20)
CO2: 27 mmol/L (ref 22–32)
CO2: 25 mmol/L (ref 22–32)
Calcium: 8 mg/dL — ABNORMAL LOW (ref 8.9–10.3)
Calcium: 8.5 mg/dL — ABNORMAL LOW (ref 8.9–10.3)
Chloride: 97 mmol/L — ABNORMAL LOW (ref 98–111)
Chloride: 94 mmol/L — ABNORMAL LOW (ref 98–111)
Creatinine, Ser: 1.61 mg/dL — ABNORMAL HIGH (ref 0.61–1.24)
Creatinine, Ser: 1.65 mg/dL — ABNORMAL HIGH (ref 0.61–1.24)
GFR, Estimated: 56 mL/min — ABNORMAL LOW (ref >60)
GFR, Estimated: 54 mL/min — ABNORMAL LOW (ref >60)
Glucose, Bld: 106 mg/dL — ABNORMAL HIGH (ref 70–99)
Glucose, Bld: 118 mg/dL — ABNORMAL HIGH (ref 70–99)
Potassium: 2.8 mmol/L — ABNORMAL LOW (ref 3.5–5.1)
Potassium: 3.2 mmol/L — ABNORMAL LOW (ref 3.5–5.1)
Sodium: 135 mmol/L (ref 135–145)
Sodium: 131 mmol/L — ABNORMAL LOW (ref 135–145)

## 2023-09-19 LAB — CBC
HCT: 34.4 % — ABNORMAL LOW (ref 39.0–52.0)
Hemoglobin: 11.8 g/dL — ABNORMAL LOW (ref 13.0–17.0)
MCH: 29 pg (ref 26.0–34.0)
MCHC: 34.3 g/dL (ref 30.0–36.0)
MCV: 84.5 fL (ref 80.0–100.0)
Platelets: 249 K/uL (ref 150–400)
RBC: 4.07 MIL/uL — ABNORMAL LOW (ref 4.22–5.81)
RDW: 16.2 % — ABNORMAL HIGH (ref 11.5–15.5)
WBC: 12.2 K/uL — ABNORMAL HIGH (ref 4.0–10.5)
nRBC: 0 % (ref 0.0–0.2)

## 2023-09-19 LAB — COOXEMETRY PANEL
Carboxyhemoglobin: 2.4 % — ABNORMAL HIGH (ref 0.5–1.5)
Methemoglobin: <0.7 % (ref 0.0–1.5)
O2 Saturation: 84.6 %
Total hemoglobin: 12.6 g/dL (ref 12.0–16.0)

## 2023-09-19 LAB — MAGNESIUM: Magnesium: 2.3 mg/dL (ref 1.7–2.4)

## 2023-09-19 MED ORDER — POTASSIUM CHLORIDE CRYS ER 20 MEQ PO TBCR
40.0000 meq | EXTENDED_RELEASE_TABLET | ORAL | Status: AC
  Administered 2023-09-19 (×2): 40 meq via ORAL
  Filled 2023-09-19 (×2): qty 2

## 2023-09-19 MED ORDER — SODIUM CHLORIDE 0.9 % IV SOLN: 250.0000 mL | INTRAVENOUS | Status: DC | PRN

## 2023-09-19 MED ORDER — SODIUM CHLORIDE 0.9% FLUSH: 3.0000 mL | INTRAVENOUS | Status: DC | PRN

## 2023-09-19 MED ORDER — SODIUM CHLORIDE 0.9% FLUSH
3.0000 mL | Freq: Two times a day (BID) | INTRAVENOUS | Status: DC
  Administered 2023-09-19: 3 mL via INTRAVENOUS

## 2023-09-19 MED ORDER — POTASSIUM CHLORIDE CRYS ER 20 MEQ PO TBCR
40.0000 meq | EXTENDED_RELEASE_TABLET | ORAL | Status: AC
  Administered 2023-09-19: 40 meq via ORAL
  Filled 2023-09-19: qty 4
  Filled 2023-09-19: qty 2

## 2023-09-19 NOTE — Plan of Care (Signed)
   Problem: Education: Goal: Knowledge of General Education information will improve Description Including pain rating scale, medication(s)/side effects and non-pharmacologic comfort measures Outcome: Progressing   Problem: Activity: Goal: Risk for activity intolerance will decrease Outcome: Progressing   Problem: Safety: Goal: Ability to remain free from injury will improve Outcome: Progressing

## 2023-09-19 NOTE — Plan of Care (Signed)

## 2023-09-19 NOTE — Plan of Care (Signed)
  Problem: Education: Goal: Knowledge of General Education information will improve Description: Including pain rating scale, medication(s)/side effects and non-pharmacologic comfort measures Outcome: Progressing   Problem: Activity: Goal: Risk for activity intolerance will decrease Outcome: Progressing   Problem: Elimination: Goal: Will not experience complications related to urinary retention Outcome: Progressing   Problem: Safety: Goal: Ability to remain free from injury will improve Outcome: Progressing   Problem: Activity: Goal: Capacity to carry out activities will improve Outcome: Progressing

## 2023-09-19 NOTE — Progress Notes (Signed)
 Progress Note   Patient: Robert Daniels FMW:[id number] DOB: 1985/08/09 DOA: 09/16/2023     2 DOS: the patient was seen and examined on 09/19/2023   Brief hospital course: Mr. Turnbo was admitted to the hospital with the working diagnosis of heart failure exacerbation.   38 yo male with the past medical history of heart failure, LV thrombus, CKD, substance abuse and obesity who presented with dyspnea.  08/08 outpatient follow up with heart failure clinic, he was found euvolemic, he was placed on entresto  and torsemide  was decreased from 80 to 40 mg daily    Reported worsening dyspnea for the last 3 days and about 15 lbs weight gain overt the last 48 hrs prior to admission.  On his initial physical examination his blood pressure was 137/101, HR 116, RR 19 and 02 saturation 99%  Lungs with bilateral rales with no wheezing, heart with S1 and S2 present and regular, no gallops, abdomen with no distention and positive lower extremity edema.   Na 134, K 3,9 Cl 100, bicarbonate 22, glucose 122 bun 22 cr 1,86  AST 45 and ALT 34, total bilirubin 5.1 (indirect 4.6)  BNP 2,277  High sensitive troponin 53 and 58 Lactic acid 2,8 and 2,5  Wbc 10,8 hgb 11.8 plt 247   Sars COVID 19 negative Influenza negative RSV negative   Chest radiograph with cardiomegaly, bilateral hilar vascular congestion and bilateral cental interstitial infiltrates, fluid in the right fissure, no effusions.   EKG 113 bpm, normal axis, normal intervals, qtc 466, sinus rhythm with bilateral atrial enlargement, poor RR wave progression, no ST segment changes, negative T waves lead I, aVL, V5-V6.   09/06 patient responding well to aggressive diuresis.  09/07 improved volume status, stopped furosemide , plan for cardiac catheterization in am.   Assessment and Plan: * Acute on chronic systolic CHF (congestive heart failure) (HCC) Echocardiogram from 02/ 2025, reduced LV systolic function to 15%, with global hypokinesis, severe  LV dilatation, moderate RV systolic function reduction, RVSP 29.7 mmHg, LA and RA with mild dilatation, no pericardial effusion.  Urine output is 2,825 ml Systolic blood pressure  90 to 100  mmHg range.  Sv02 84  CVP 5 to 8   Stopped furosemide  drip.   Continue with milrinone  for inotropic support 0.25 mcg/kg/min  Continue SGLT 2 inh, spironolactone  and digoxin .   LV thrombus, continue with apixaban  for anticoagulation.   Plan for right heart catheterization tomorrow.   HTN (hypertension) Continue blood pressure monitoring Patient on milrinone  Stopped furosemide  drip today  CKD (chronic kidney disease) stage 2, GFR 60-89 ml/min AKI, Hyponatremia. Hypokalemia   Renal function with serum cr at 1,61 with K at 2,8 and serum bicarbonate at 27  Na 135 Mg 2.3   Continue K correction with Kcl 40 meq x2 follow up electrolytes in pm, may need further KCl.  Follow up renal function and electrolytes in am. Avoid hypotension and nephrotoxic medications.   History of substance use disorder Toxicology scree negative   Obesity, class 2 Calculated BMI is 38,4       Subjective: Patient with improvement in dyspnea, no chest pain, no edema, PND or orthopnea   Physical Exam: Vitals:   09/18/23 1324 09/18/23 1943 09/19/23 0013 09/19/23 0456  BP: 90/69 98/73 116/86 112/78  Pulse: (!) 109 (!) 113 (!) 116 (!) 109  Resp: 20  20 (!) 21  Temp: 99.7 F (37.6 C) (!) 100.9 F (38.3 C) (!) 101.5 F (38.6 C) 99.6 F (37.6 C)  TempSrc: Oral Oral Oral Oral  SpO2: 98% 99% 97% 99%  Weight:   121.8 kg   Height:       Neurology awake and alert ENT with mild pallor Cardiovascular with S1 and S2 present and regular with no gallops, rubs or murmurs No JVD Respiratory with no rales or wheezing, no rhonchi  Abdomen with no distention  No lower extremity edema   Data Reviewed:    Family Communication: no family at the bedside  Disposition: Status is: Inpatient Remains inpatient appropriate  because: pending cardiac catheterization   Planned Discharge Destination: Home     Author: Elidia Toribio Furnace, MD 09/19/2023 6:47 AM  For on call review [email or web address].

## 2023-09-19 NOTE — Progress Notes (Signed)
 Advanced Heart Failure Rounding Note   Subjective:    Started on milrinone  0.25 on 9/5 for co-ox 38%. Co-ox 85% today. (Doubt accurate)  Has diuresed another 3 L on IV Lasix .  Weight down 15 pounds total.  Potassium 2.8  Feeling much better.  Serum creatinine stable.  Remains tachycardic. CVP 5   Objective:   Weight Range:  Vital Signs:   Temp:  [98.4 F (36.9 C)-101.5 F (38.6 C)] 98.4 F (36.9 C) (09/07 0739) Pulse Rate:  [109-116] 111 (09/07 0739) Resp:  [20-21] 20 (09/07 0739) BP: (90-116)/(69-86) 99/75 (09/07 0739) SpO2:  [97 %-99 %] 99 % (09/07 0739) Weight:  [121.8 kg] 121.8 kg (09/07 0013) Last BM Date : 09/18/23  Weight change: Filed Weights   09/17/23 0543 09/18/23 0447 09/19/23 0013  Weight: 128.7 kg 124.9 kg 121.8 kg    Intake/Output:   Intake/Output Summary (Last 24 hours) at 09/19/2023 1054 Last data filed at 09/19/2023 0743 Gross per 24 hour  Intake 300 ml  Output 2525 ml  Net -2225 ml     Physical Exam: Obese male lying in bed no acute distress. HEENT is normal Neck supple no obvious JVD. Cardiac regular tachycardic plus S3 Lungs clear Abdomen obese nontender nondistended good bowel sounds Extremities are warm no cyanosis clubbing or edema. Neuro alert and oriented cranial nerves are intact moves all 4 extremities without difficulty.  Telemetry: Sinus tach at 100-110.  Personally reviewed  Labs: Basic Metabolic Panel: Recent Labs  Lab 09/16/23 2353 09/17/23 0005 09/17/23 0915 09/18/23 0237 09/19/23 0557  NA 134* 136 133* 135 135  K 3.9 4.2 3.6 3.2* 2.8*  CL 100 102 100 99 97*  CO2 22  --  21* 23 27  GLUCOSE 122* 119* 122* 143* 106*  BUN 22* 24* 23* 20 14  CREATININE 1.86* 2.00* 1.91* 1.94* 1.61*  CALCIUM 9.2  --  8.7* 8.5* 8.0*  MG  --   --   --  1.8 2.3    Liver Function Tests: Recent Labs  Lab 09/16/23 2353 09/18/23 0237  AST 45* 87*  ALT 34 77*  ALKPHOS 47 45  BILITOT 5.1* 5.0*  PROT 7.5 7.5  ALBUMIN 3.5 3.4*    No results for input(s): LIPASE, AMYLASE in the last 168 hours. No results for input(s): AMMONIA in the last 168 hours.  CBC: Recent Labs  Lab 09/16/23 2353 09/17/23 0005 09/18/23 0237 09/19/23 0557  WBC 10.8*  --  13.2* 12.2*  HGB 11.8* 12.9* 11.8* 11.8*  HCT 36.8* 38.0* 35.2* 34.4*  MCV 87.6  --  85.2 84.5  PLT 247  --  245 249    Cardiac Enzymes: No results for input(s): CKTOTAL, CKMB, CKMBINDEX, TROPONINI in the last 168 hours.  BNP: BNP (last 3 results) Recent Labs    06/21/23 1509 07/20/23 1154 09/16/23 2353  BNP 673.5* 925.3* 2,277.9*    ProBNP (last 3 results) No results for input(s): PROBNP in the last 8760 hours.    Other results:  Imaging: No results found.    Medications:     Scheduled Medications:  apixaban   5 mg Oral BID   Chlorhexidine  Gluconate Cloth  6 each Topical Daily   digoxin   0.0625 mg Oral Daily   empagliflozin   10 mg Oral Daily   polyethylene glycol  17 g Oral Daily   potassium chloride   40 mEq Oral Q4H   sodium chloride  flush  10-40 mL Intracatheter Q12H   sodium chloride  flush  3 mL Intravenous  Q12H   spironolactone   12.5 mg Oral Daily    Infusions:  furosemide  (LASIX ) 200 mg in dextrose  5 % 100 mL (2 mg/mL) infusion 15 mg/hr (09/19/23 0112)   milrinone  0.25 mcg/kg/min (09/19/23 0007)    PRN Medications: acetaminophen , ondansetron  (ZOFRAN ) IV, sodium chloride  flush, sodium chloride  flush   Assessment:   1. A/C Biventricular HFrEF , NICM  - Initially diagnosed with HFrEF in 2023.  Left heart catheter time with no CAD.  Echo [facility] July 2025 --LVEF 15% RV severely reduced.  - July 2023 CPX VO2 12.7 (43%) 19 when adjusted  to ideal body weight. Slope 42 - Low output on admit Coox 38%  - Now on milrinone  0.25  Coox 84% I doubt this is accurate.  Will repeat continue milrinone  for now. - He is down 15 pounds from admission.  CVP is 5 will stop IV Lasix  - No bb with suspected low output. - Continue  digoxin  0.0625 - Continue  jardiance  10 mg daily  - Restart spiro 12.5y - Plan formal right heart cath tomorrow. - Long talk about need and candidacy for advanced therapies. Currently not a candidate for transplant with tobacco use. We discussed smoking cessation. Needs to avoid nicotine  6 months for consideration.  He was seen by VAD coordinators in June for possible VAD. RV dysfunction may preclude VAD. Will need RHC once diuresed. Discussed directly that he needs to be more engaged in his own care to be candidate for advanced therapies   2. AKI due to cardiorenal syndrome Baseline creatinine ~ 1.3-1.4. On admit 1.9.  - scr improved at 1.6 today.   3. Severe Sleep Apnea AHI 54 Desaturation 78%, CPAP was recommended.  Outpatient Pulmonary working on CPAP.   4. Tobacco Abuse Recently started smoking again.  Discussed cessation as above   5. LV Thrombus  He has been on eliquis  for possible LV thrombus noted 02/2023. Continue eliquis  5 mg twice - ok to continue for RHC   6. Obesity - Body mass index is 36.42 kg/m. - requesting a Nutrition consult. Will call tomorrow  7. Hypokalemia - Restart spiro -Will supplement  Length of Stay: 2   Toribio Fuel MD 09/19/2023, 10:54 AM  Advanced Heart Failure Team Pager [phone] (M-F; 7a - 4p)  Please contact [facility] for night-coverage after hours (4p -7a ) and weekends on [email or web address]

## 2023-09-20 ENCOUNTER — Encounter (HOSPITAL_COMMUNITY)
Admission: EM | Disposition: A | Discharge status: Home / Self Care | Payer: Self-pay | Source: Home / Self Care | Attending: Internal Medicine

## 2023-09-20 DIAGNOSIS — Z87898 Personal history of other specified conditions: Secondary | ICD-10-CM | POA: Diagnosis not present

## 2023-09-20 DIAGNOSIS — I5023 Acute on chronic systolic (congestive) heart failure: Secondary | ICD-10-CM | POA: Diagnosis not present

## 2023-09-20 DIAGNOSIS — I10 Essential (primary) hypertension: Secondary | ICD-10-CM | POA: Diagnosis not present

## 2023-09-20 DIAGNOSIS — N182 Chronic kidney disease, stage 2 (mild): Secondary | ICD-10-CM | POA: Diagnosis not present

## 2023-09-20 HISTORY — PX: RIGHT HEART CATH: CATH118263

## 2023-09-20 LAB — POCT I-STAT EG7
Acid-Base Excess: 1 mmol/L (ref 0.0–2.0)
Acid-Base Excess: 2 mmol/L (ref 0.0–2.0)
Acid-Base Excess: 2 mmol/L (ref 0.0–2.0)
Bicarbonate: 22.2 mmol/L (ref 20.0–28.0)
Bicarbonate: 24.8 mmol/L (ref 20.0–28.0)
Bicarbonate: 26.1 mmol/L (ref 20.0–28.0)
Calcium, Ion: 1.11 mmol/L — ABNORMAL LOW (ref 1.15–1.40)
Calcium, Ion: 1.15 mmol/L (ref 1.15–1.40)
Calcium, Ion: 1.18 mmol/L (ref 1.15–1.40)
HCT: 34 % — ABNORMAL LOW (ref 39.0–52.0)
HCT: 35 % — ABNORMAL LOW (ref 39.0–52.0)
HCT: 35 % — ABNORMAL LOW (ref 39.0–52.0)
Hemoglobin: 11.6 g/dL — ABNORMAL LOW (ref 13.0–17.0)
Hemoglobin: 11.9 g/dL — ABNORMAL LOW (ref 13.0–17.0)
Hemoglobin: 11.9 g/dL — ABNORMAL LOW (ref 13.0–17.0)
O2 Saturation: 57 %
O2 Saturation: 61 %
O2 Saturation: 89 %
Potassium: 3.5 mmol/L (ref 3.5–5.1)
Potassium: 3.6 mmol/L (ref 3.5–5.1)
Potassium: 3.7 mmol/L (ref 3.5–5.1)
Sodium: 138 mmol/L (ref 135–145)
Sodium: 139 mmol/L (ref 135–145)
Sodium: 141 mmol/L (ref 135–145)
TCO2: 23 mmol/L (ref 22–32)
TCO2: 26 mmol/L (ref 22–32)
TCO2: 27 mmol/L (ref 22–32)
pCO2, Ven: 23.4 mmHg — ABNORMAL LOW (ref 44–60)
pCO2, Ven: 36.4 mmHg — ABNORMAL LOW (ref 44–60)
pCO2, Ven: 37.9 mmHg — ABNORMAL LOW (ref 44–60)
pH, Ven: 7.442 — ABNORMAL HIGH (ref 7.25–7.43)
pH, Ven: 7.446 — ABNORMAL HIGH (ref 7.25–7.43)
pH, Ven: 7.585 — ABNORMAL HIGH (ref 7.25–7.43)
pO2, Ven: 29 mmHg — CL (ref 32–45)
pO2, Ven: 30 mmHg — CL (ref 32–45)
pO2, Ven: 45 mmHg (ref 32–45)

## 2023-09-20 LAB — CBC
HCT: 35.8 % — ABNORMAL LOW (ref 39.0–52.0)
Hemoglobin: 11.9 g/dL — ABNORMAL LOW (ref 13.0–17.0)
MCH: 28.3 pg (ref 26.0–34.0)
MCHC: 33.2 g/dL (ref 30.0–36.0)
MCV: 85.2 fL (ref 80.0–100.0)
Platelets: 266 K/uL (ref 150–400)
RBC: 4.2 MIL/uL — ABNORMAL LOW (ref 4.22–5.81)
RDW: 16.3 % — ABNORMAL HIGH (ref 11.5–15.5)
WBC: 12.1 K/uL — ABNORMAL HIGH (ref 4.0–10.5)
nRBC: 0 % (ref 0.0–0.2)

## 2023-09-20 LAB — BASIC METABOLIC PANEL WITH GFR
Anion gap: 9 (ref 5–15)
BUN: 14 mg/dL (ref 6–20)
CO2: 26 mmol/L (ref 22–32)
Calcium: 8.8 mg/dL — ABNORMAL LOW (ref 8.9–10.3)
Chloride: 101 mmol/L (ref 98–111)
Creatinine, Ser: 1.5 mg/dL — ABNORMAL HIGH (ref 0.61–1.24)
GFR, Estimated: >60 mL/min (ref >60)
Glucose, Bld: 107 mg/dL — ABNORMAL HIGH (ref 70–99)
Potassium: 4 mmol/L (ref 3.5–5.1)
Sodium: 136 mmol/L (ref 135–145)

## 2023-09-20 LAB — COOXEMETRY PANEL
Carboxyhemoglobin: 1.8 % — ABNORMAL HIGH (ref 0.5–1.5)
Methemoglobin: <0.7 % (ref 0.0–1.5)
O2 Saturation: 54.8 %
Total hemoglobin: 12.5 g/dL (ref 12.0–16.0)

## 2023-09-20 LAB — MAGNESIUM: Magnesium: 2.3 mg/dL (ref 1.7–2.4)

## 2023-09-20 SURGERY — RIGHT HEART CATH
Anesthesia: LOCAL

## 2023-09-20 MED ORDER — VITAMIN C 500 MG PO TABS
500.0000 mg | ORAL_TABLET | Freq: Every day | ORAL | Status: DC
  Administered 2023-09-20 – 2023-09-27 (×8): 500 mg via ORAL
  Filled 2023-09-20 (×8): qty 1

## 2023-09-20 MED ORDER — SODIUM CHLORIDE 0.9% FLUSH
3.0000 mL | INTRAVENOUS | Status: DC | PRN
  Administered 2023-09-21: 3 mL via INTRAVENOUS

## 2023-09-20 MED ORDER — FUROSEMIDE 10 MG/ML IJ SOLN
80.0000 mg | Freq: Once | INTRAMUSCULAR | Status: AC
  Administered 2023-09-20: 80 mg via INTRAVENOUS
  Filled 2023-09-20: qty 8

## 2023-09-20 MED ORDER — HEPARIN (PORCINE) IN NACL 1000-0.9 UT/500ML-% IV SOLN
INTRAVENOUS | Status: DC | PRN
  Administered 2023-09-20: 1000 mL

## 2023-09-20 MED ORDER — POTASSIUM CHLORIDE CRYS ER 20 MEQ PO TBCR
40.0000 meq | EXTENDED_RELEASE_TABLET | Freq: Once | ORAL | Status: AC
  Administered 2023-09-20: 40 meq via ORAL
  Filled 2023-09-20: qty 2

## 2023-09-20 MED ORDER — LIDOCAINE HCL (PF) 1 % IJ SOLN
INTRAMUSCULAR | Status: DC | PRN
Start: 2023-09-20 — End: 2023-09-20
  Administered 2023-09-20: 2 mL via INTRADERMAL

## 2023-09-20 MED ORDER — LABETALOL HCL 5 MG/ML IV SOLN
10.0000 mg | INTRAVENOUS | Status: AC | PRN
Start: 2023-09-20 — End: 2023-09-20

## 2023-09-20 MED ORDER — ONDANSETRON HCL 4 MG/2ML IJ SOLN: 4.0000 mg | Freq: Four times a day (QID) | INTRAMUSCULAR | Status: DC | PRN

## 2023-09-20 MED ORDER — HYDRALAZINE HCL 20 MG/ML IJ SOLN
10.0000 mg | INTRAMUSCULAR | Status: AC | PRN
Start: 2023-09-20 — End: 2023-09-20

## 2023-09-20 MED ORDER — SODIUM CHLORIDE 0.9 % IV SOLN
250.0000 mL | INTRAVENOUS | Status: AC | PRN
Start: 2023-09-20 — End: 2023-09-21

## 2023-09-20 MED ORDER — POTASSIUM CHLORIDE CRYS ER 20 MEQ PO TBCR
40.0000 meq | EXTENDED_RELEASE_TABLET | Freq: Once | ORAL | Status: DC
  Filled 2023-09-20: qty 2

## 2023-09-20 MED ORDER — LIDOCAINE HCL (PF) 1 % IJ SOLN
INTRAMUSCULAR | Status: AC
  Filled 2023-09-20: qty 30

## 2023-09-20 MED ORDER — FUROSEMIDE 10 MG/ML IJ SOLN
10.0000 mg/h | INTRAVENOUS | Status: DC
  Administered 2023-09-20 – 2023-09-21 (×3): 10 mg/h via INTRAVENOUS
  Filled 2023-09-20 (×3): qty 20

## 2023-09-20 MED ORDER — SODIUM CHLORIDE 0.9% FLUSH
3.0000 mL | Freq: Two times a day (BID) | INTRAVENOUS | Status: DC
  Administered 2023-09-20 – 2023-09-23 (×5): 3 mL via INTRAVENOUS

## 2023-09-20 MED ORDER — ACETAMINOPHEN 325 MG PO TABS: 650.0000 mg | ORAL_TABLET | ORAL | Status: DC | PRN

## 2023-09-20 SURGICAL SUPPLY — 7 items
CATH BALLN WEDGE 5F 110CM (CATHETERS) IMPLANT
CATH SWAN GANZ 7F STRAIGHT (CATHETERS) IMPLANT
GLIDESHEATH SLENDER 7FR .021G (SHEATH) IMPLANT
GUIDEWIRE .025 260CM (WIRE) IMPLANT
PACK CARDIAC CATHETERIZATION (CUSTOM PROCEDURE TRAY) ×2 IMPLANT
TRANSDUCER W/STOPCOCK (MISCELLANEOUS) IMPLANT
TUBING ART PRESS 72 MALE/FEM (TUBING) IMPLANT

## 2023-09-20 NOTE — Plan of Care (Signed)
 Nutrition Education Note  RD consulted for nutrition education regarding CHF.  RD provided Heart Healthy Nutrition Therapy handout from the Academy [employer]. Reviewed patient's dietary recall. Provided examples on ways to decrease sodium intake in diet. Discouraged intake of processed foods and use of salt shaker. Encouraged fresh fruits and vegetables as well as whole grain sources of carbohydrates to maximize fiber intake.   RD discussed why it is important for patient to adhere to diet recommendations, and emphasized the role of fluids, foods to avoid, and importance of weighing self daily. Teach back method used.  Discussed motivation, barriers to change, non-restrictive mindset. Pt verbalized understanding.   Expect fair compliance.  Body mass index is 35.94 kg/m. Pt meets criteria for Obesity based on current BMI.  Current diet order is Heart Healthy diet, patient is consuming approximately 100% of meals at this time. Labs and medications reviewed. No further nutrition interventions warranted at this time. RD contact information provided. If additional nutrition issues arise, please re-consult RD.   Jedidiah Demartini Daml-Budig, RDN, LDN Registered Dietitian Nutritionist RD Inpatient Contact Info in [place]
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
## 2023-09-20 NOTE — Progress Notes (Addendum)
 Progress Note   Patient: Robert Daniels FMW:[id number] DOB: [birth year] DOA: 09/16/2023     3 DOS: the patient was seen and examined on 09/20/2023   Brief hospital course: Robert Daniels was admitted to the hospital with the working diagnosis of heart failure exacerbation.   38 yo male with the past medical history of heart failure, LV thrombus, CKD, substance abuse and obesity who presented with dyspnea.  08/08 outpatient follow up with heart failure clinic, he was found euvolemic, he was placed on entresto  and torsemide  was decreased from 80 to 40 mg daily    Reported worsening dyspnea for the last 3 days and about 15 lbs weight gain overt the last 48 hrs prior to admission.  On his initial physical examination his blood pressure was 137/101, HR 116, RR 19 and 02 saturation 99%  Lungs with bilateral rales with no wheezing, heart with S1 and S2 present and regular, no gallops, abdomen with no distention and positive lower extremity edema.   Na 134, K 3,9 Cl 100, bicarbonate 22, glucose 122 bun 22 cr 1,86  AST 45 and ALT 34, total bilirubin 5.1 (indirect 4.6)  BNP 2,277  High sensitive troponin 53 and 58 Lactic acid 2,8 and 2,5  Wbc 10,8 hgb 11.8 plt 247   Sars COVID 19 negative Influenza negative RSV negative   Chest radiograph with cardiomegaly, bilateral hilar vascular congestion and bilateral cental interstitial infiltrates, fluid in the right fissure, no effusions.   EKG 113 bpm, normal axis, normal intervals, qtc 466, sinus rhythm with bilateral atrial enlargement, poor RR wave progression, no ST segment changes, negative T waves lead I, aVL, V5-V6.   09/06 patient responding well to aggressive diuresis.  09/07 improved volume status, stopped furosemide , plan for cardiac catheterization in am.  09/08 cardiac catheterization with elevated filling pressures.   Assessment and Plan: * Acute on chronic systolic CHF (congestive heart failure) (HCC) Echocardiogram from 02/ 2025,  reduced LV systolic function to 15%, with global hypokinesis, severe LV dilatation, moderate RV systolic function reduction, RVSP 29.7 mmHg, LA and RA with mild dilatation, no pericardial effusion.  Urine output is 2,075 ml Systolic blood pressure  115  mmHg range.  Sv02 54.8  CVP 1  09/08 cardiac catheterization  RA 6 RV 50/10 PA 45/28 mean 31  PCWP 30  Cardiac output 5.5 and index 2.3 (on milrinone  0,25 mcg)  PVR 0.2 Wood Units.   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   Resumed furosemide  drip 10 mg per hr  Continue with milrinone  for inotropic support 0.25 mcg/kg/min  Continue SGLT 2 inh, spironolactone  and digoxin .   LV thrombus, continue with apixaban  for anticoagulation.   HTN (hypertension) Continue blood pressure monitoring Patient on milrinone  Resumed  furosemide  drip   CKD (chronic kidney disease) stage 2, GFR 60-89 ml/min AKI, Hyponatremia. Hypokalemia   Renal function with serum cr at 1.50 with K at 4,0 and serum bicarbonate at 26  Na 136 and Mg 2.3   Add 40 Kcl to prevent hypokalemia  Follow up renal function and electrolytes in am. Avoid hypotension and nephrotoxic medications.   History of substance use disorder Toxicology scree negative   Obesity, class 2 Calculated BMI is 38,4         Subjective: Patient with no chest pain, dyspnea and edema continue to improve, no PND or orthopnea   Physical Exam: Vitals:   09/20/23 0904 09/20/23 1008 09/20/23 1044 09/20/23 1134  BP: (!) 102/90  124/85 115/89  Pulse: (!) 110 (!) 118 (!) 116 (!) 120  Resp: 16  18 20   Temp: 98.2 F (36.8 C)  98.6 F (37 C) 99.5 F (37.5 C)  TempSrc: Oral  Oral Oral  SpO2: 96%   95%  Weight:      Height:       Neurology awake and alert ENT with no pallor Cardiovascular with S1 and S2 present and regular with no gallops, rubs or murmurs Respiratory with no rales or wheezing, no rhonchi  Abdomen with no distention and non tender Lower  extremity edema +  Data Reviewed:    Family Communication: no family at the bedside   Disposition: Status is: Inpatient Remains inpatient appropriate because: IV diuresis and inotropics   Planned Discharge Destination: Home    Author: Elidia Toribio Furnace, MD 09/20/2023 2:41 PM  For on call review [email or web address].

## 2023-09-20 NOTE — Progress Notes (Addendum)
 Advanced Heart Failure Rounding Note   Subjective:    CO-OX 55% on 0.25 milrinone .  Going for RHC this am. Awaiting morning labs.  CVP 8-9. No dyspnea or orthopnea. Ambulated yesterday without significant symptoms.   Objective:    Vital Signs:   Temp:  [98.4 F (36.9 C)-99.6 F (37.6 C)] 98.9 F (37.2 C) (09/08 0500) Pulse Rate:  [104-114] 114 (09/08 0500) Resp:  [20] 20 (09/07 1932) BP: (99-117)/(75-81) 117/78 (09/08 0500) SpO2:  [99 %] 99 % (09/08 0500) Weight:  [120.2 kg] 120.2 kg (09/08 0500) Last BM Date : 09/18/23  Weight change: Filed Weights   09/18/23 0447 09/19/23 0013 09/20/23 0500  Weight: 124.9 kg 121.8 kg 120.2 kg    Intake/Output:   Intake/Output Summary (Last 24 hours) at 09/20/2023 0659 Last data filed at 09/20/2023 0500 Gross per 24 hour  Intake 732.12 ml  Output 2075 ml  Net -1342.88 ml     Physical Exam: General:  Well appearing.  Cor: JVP to midneck. Regular rate & rhythm. No murmurs. Lungs: clear Abdomen: soft, nontender, nondistended.  Extremities: no edema Neuro: alert & orientedx3. Affect flat   Telemetry: ST 110s, ~ 10 PVCs  Labs: Basic Metabolic Panel: Recent Labs  Lab 09/16/23 2353 09/17/23 0005 09/17/23 0915 09/18/23 0237 09/19/23 0557 09/19/23 1657  NA 134* 136 133* 135 135 131*  K 3.9 4.2 3.6 3.2* 2.8* 3.2*  CL 100 102 100 99 97* 94*  CO2 22  --  21* 23 27 25   GLUCOSE 122* 119* 122* 143* 106* 118*  BUN 22* 24* 23* 20 14 16   CREATININE 1.86* 2.00* 1.91* 1.94* 1.61* 1.65*  CALCIUM 9.2  --  8.7* 8.5* 8.0* 8.5*  MG  --   --   --  1.8 2.3  --     Liver Function Tests: Recent Labs  Lab 09/16/23 2353 09/18/23 0237  AST 45* 87*  ALT 34 77*  ALKPHOS 47 45  BILITOT 5.1* 5.0*  PROT 7.5 7.5  ALBUMIN 3.5 3.4*   No results for input(s): LIPASE, AMYLASE in the last 168 hours. No results for input(s): AMMONIA in the last 168 hours.  CBC: Recent Labs  Lab 09/16/23 2353 09/17/23 0005 09/18/23 0237  09/19/23 0557  WBC 10.8*  --  13.2* 12.2*  HGB 11.8* 12.9* 11.8* 11.8*  HCT 36.8* 38.0* 35.2* 34.4*  MCV 87.6  --  85.2 84.5  PLT 247  --  245 249    Cardiac Enzymes: No results for input(s): CKTOTAL, CKMB, CKMBINDEX, TROPONINI in the last 168 hours.  BNP: BNP (last 3 results) Recent Labs    06/21/23 1509 07/20/23 1154 09/16/23 2353  BNP 673.5* 925.3* 2,277.9*    ProBNP (last 3 results) No results for input(s): PROBNP in the last 8760 hours.    Other results:  Imaging: No results found.    Medications:     Scheduled Medications:  apixaban   5 mg Oral BID   Chlorhexidine  Gluconate Cloth  6 each Topical Daily   digoxin   0.0625 mg Oral Daily   empagliflozin   10 mg Oral Daily   polyethylene glycol  17 g Oral Daily   potassium chloride   40 mEq Oral Once   sodium chloride  flush  10-40 mL Intracatheter Q12H   sodium chloride  flush  3 mL Intravenous Q12H   sodium chloride  flush  3 mL Intravenous Q12H   spironolactone   12.5 mg Oral Daily    Infusions:  sodium chloride      milrinone   0.25 mcg/kg/min (09/19/23 2308)    PRN Medications: sodium chloride , acetaminophen , ondansetron  (ZOFRAN ) IV, sodium chloride  flush, sodium chloride  flush, sodium chloride  flush   Assessment:   1. A/C Biventricular HFrEF , NICM  - Initially diagnosed with HFrEF in 2023.  Left heart catheter time with no CAD.  [facility] July 2025 --LVEF 15% RV severely reduced.  - July 2023 CPX VO2 12.7 (43%) 19 when adjusted  to ideal body weight. Slope 42 - Low output on admit Coox 38%  - CO-OX 58% on 0.25 milrinone . - Now off diuretics. Down 18 lb but CVP higher today. Reassess volume on RHC. Suspect will need to add regular diuretic soon. - No bb with suspected low output. - Continue digoxin  0.0625 - Continue  jardiance  10 mg daily  - Continue spiro 12.5 mg daily - Losartan  next - RHC today - Long talk about need and candidacy for advanced therapies. Currently not a candidate for  transplant with tobacco use. We discussed smoking cessation. Needs to avoid nicotine  6 months for consideration.  He was seen by VAD coordinators in June for possible VAD. RV dysfunction may preclude VAD. Discussed directly that he needs to be more engaged in his own care to be candidate for advanced therapies   2. AKI due to cardiorenal syndrome Baseline creatinine ~ 1.3-1.4. On admit 1.9.  - Awaiting morning labs   3. Severe Sleep Apnea AHI 54 Desaturation 78%, CPAP was recommended.  Outpatient Pulmonary working on CPAP.   4. Tobacco Abuse - Recently started smoking again.  - Discussed cessation as above   5. LV Thrombus  - He has been on eliquis  for possible LV thrombus noted 02/2023. - Continue eliquis  5 mg BID   6. Obesity - Body mass index is 35.94 kg/m. - requesting a Nutrition consult. Order placed.  7. Hypokalemia - Restarted spiro - Follow-up on AM labs  Length of Stay: 3   FINCH, LINDSAY N PA-C 09/20/2023, 6:59 AM  Advanced Heart Failure Team Pager [phone] (M-F; 7a - 4p)  Please contact [facility] for night-coverage after hours (4p -7a ) and weekends on [email or web address]   Patient seen and examined with the above-signed Advanced Practice Provider and/or Housestaff. I personally reviewed laboratory data, imaging studies and relevant notes. I independently examined the patient and formulated the important aspects of the plan. I have edited the note to reflect any of my changes or salient points. I have personally discussed the plan with the patient and/or family.  Lasix  stopped yesterday. Remains on milrinone  with marginal co-ox.   Denies CP or SOB. For RHC today  General:  sitting up in bed  No resp difficulty HEENT: normal Neck: supple. Jvp 8-9. Carotids 2+ bilat; no bruits. No lymphadenopathy or thryomegaly appreciated. Cor: PMI nondisplaced. Regular rate & rhythm. No rubs, gallops or murmurs. Lungs: clear Abdomen: obese soft, nontender, nondistended. No  hepatosplenomegaly. No bruits or masses. Good bowel sounds. Extremities: no cyanosis, clubbing, rash, edema Neuro: alert & orientedx3, cranial nerves grossly intact. moves all 4 extremities w/o difficulty. Affect pleasant  Remains milrinone  dependent. Co-ox marginal. Plan RHC today to help with next steps. May need to consider VAD if RV can tolerated given delayed transplant timeline.   Toribio Fuel, MD  8:09 AM

## 2023-09-20 NOTE — Interval H&P Note (Signed)
 History and Physical Interval Note:  09/20/2023 8:10 AM  Robert Daniels  has presented today for surgery, with the diagnosis of HF.  The various methods of treatment have been discussed with the patient and family. After consideration of risks, benefits and other options for treatment, the patient has consented to  Procedure(s): RIGHT HEART CATH (N/A) as a surgical intervention.  The patient's history has been reviewed, patient examined, no change in status, stable for surgery.  I have reviewed the patient's chart and labs.  Questions were answered to the patient's satisfaction.     [name]

## 2023-09-20 NOTE — H&P (View-Only) (Signed)
 Advanced Heart Failure Rounding Note   Subjective:    CO-OX 55% on 0.25 milrinone .  Going for RHC this am. Awaiting morning labs.  CVP 8-9. No dyspnea or orthopnea. Ambulated yesterday without significant symptoms.   Objective:    Vital Signs:   Temp:  [98.4 F (36.9 C)-99.6 F (37.6 C)] 98.9 F (37.2 C) (09/08 0500) Pulse Rate:  [104-114] 114 (09/08 0500) Resp:  [20] 20 (09/07 1932) BP: (99-117)/(75-81) 117/78 (09/08 0500) SpO2:  [99 %] 99 % (09/08 0500) Weight:  [120.2 kg] 120.2 kg (09/08 0500) Last BM Date : 09/18/23  Weight change: Filed Weights   09/18/23 0447 09/19/23 0013 09/20/23 0500  Weight: 124.9 kg 121.8 kg 120.2 kg    Intake/Output:   Intake/Output Summary (Last 24 hours) at 09/20/2023 0659 Last data filed at 09/20/2023 0500 Gross per 24 hour  Intake 732.12 ml  Output 2075 ml  Net -1342.88 ml     Physical Exam: General:  Well appearing.  Cor: JVP to midneck. Regular rate & rhythm. No murmurs. Lungs: clear Abdomen: soft, nontender, nondistended.  Extremities: no edema Neuro: alert & orientedx3. Affect flat   Telemetry: ST 110s, ~ 10 PVCs  Labs: Basic Metabolic Panel: Recent Labs  Lab 09/16/23 2353 09/17/23 0005 09/17/23 0915 09/18/23 0237 09/19/23 0557 09/19/23 1657  NA 134* 136 133* 135 135 131*  K 3.9 4.2 3.6 3.2* 2.8* 3.2*  CL 100 102 100 99 97* 94*  CO2 22  --  21* 23 27 25   GLUCOSE 122* 119* 122* 143* 106* 118*  BUN 22* 24* 23* 20 14 16   CREATININE 1.86* 2.00* 1.91* 1.94* 1.61* 1.65*  CALCIUM 9.2  --  8.7* 8.5* 8.0* 8.5*  MG  --   --   --  1.8 2.3  --     Liver Function Tests: Recent Labs  Lab 09/16/23 2353 09/18/23 0237  AST 45* 87*  ALT 34 77*  ALKPHOS 47 45  BILITOT 5.1* 5.0*  PROT 7.5 7.5  ALBUMIN 3.5 3.4*   No results for input(s): LIPASE, AMYLASE in the last 168 hours. No results for input(s): AMMONIA in the last 168 hours.  CBC: Recent Labs  Lab 09/16/23 2353 09/17/23 0005 09/18/23 0237  09/19/23 0557  WBC 10.8*  --  13.2* 12.2*  HGB 11.8* 12.9* 11.8* 11.8*  HCT 36.8* 38.0* 35.2* 34.4*  MCV 87.6  --  85.2 84.5  PLT 247  --  245 249    Cardiac Enzymes: No results for input(s): CKTOTAL, CKMB, CKMBINDEX, TROPONINI in the last 168 hours.  BNP: BNP (last 3 results) Recent Labs    06/21/23 1509 07/20/23 1154 09/16/23 2353  BNP 673.5* 925.3* 2,277.9*    ProBNP (last 3 results) No results for input(s): PROBNP in the last 8760 hours.    Other results:  Imaging: No results found.    Medications:     Scheduled Medications:  apixaban   5 mg Oral BID   Chlorhexidine  Gluconate Cloth  6 each Topical Daily   digoxin   0.0625 mg Oral Daily   empagliflozin   10 mg Oral Daily   polyethylene glycol  17 g Oral Daily   potassium chloride   40 mEq Oral Once   sodium chloride  flush  10-40 mL Intracatheter Q12H   sodium chloride  flush  3 mL Intravenous Q12H   sodium chloride  flush  3 mL Intravenous Q12H   spironolactone   12.5 mg Oral Daily    Infusions:  sodium chloride      milrinone   0.25 mcg/kg/min (09/19/23 2308)    PRN Medications: sodium chloride , acetaminophen , ondansetron  (ZOFRAN ) IV, sodium chloride  flush, sodium chloride  flush, sodium chloride  flush   Assessment:   1. A/C Biventricular HFrEF , NICM  - Initially diagnosed with HFrEF in 2023.  Left heart catheter time with no CAD.  [facility] July 2025 --LVEF 15% RV severely reduced.  - July 2023 CPX VO2 12.7 (43%) 19 when adjusted  to ideal body weight. Slope 42 - Low output on admit Coox 38%  - CO-OX 58% on 0.25 milrinone . - Now off diuretics. Down 18 lb but CVP higher today. Reassess volume on RHC. Suspect will need to add regular diuretic soon. - No bb with suspected low output. - Continue digoxin  0.0625 - Continue  jardiance  10 mg daily  - Continue spiro 12.5 mg daily - Losartan  next - RHC today - Long talk about need and candidacy for advanced therapies. Currently not a candidate for  transplant with tobacco use. We discussed smoking cessation. Needs to avoid nicotine  6 months for consideration.  He was seen by VAD coordinators in June for possible VAD. RV dysfunction may preclude VAD. Discussed directly that he needs to be more engaged in his own care to be candidate for advanced therapies   2. AKI due to cardiorenal syndrome Baseline creatinine ~ 1.3-1.4. On admit 1.9.  - Awaiting morning labs   3. Severe Sleep Apnea AHI 54 Desaturation 78%, CPAP was recommended.  Outpatient Pulmonary working on CPAP.   4. Tobacco Abuse - Recently started smoking again.  - Discussed cessation as above   5. LV Thrombus  - He has been on eliquis  for possible LV thrombus noted 02/2023. - Continue eliquis  5 mg BID   6. Obesity - Body mass index is 35.94 kg/m. - requesting a Nutrition consult. Order placed.  7. Hypokalemia - Restarted spiro - Follow-up on AM labs  Length of Stay: 3   FINCH, LINDSAY N PA-C 09/20/2023, 6:59 AM  Advanced Heart Failure Team Pager [phone] (M-F; 7a - 4p)  Please contact [facility] for night-coverage after hours (4p -7a ) and weekends on [email or web address]   Patient seen and examined with the above-signed Advanced Practice Provider and/or Housestaff. I personally reviewed laboratory data, imaging studies and relevant notes. I independently examined the patient and formulated the important aspects of the plan. I have edited the note to reflect any of my changes or salient points. I have personally discussed the plan with the patient and/or family.  Lasix  stopped yesterday. Remains on milrinone  with marginal co-ox.   Denies CP or SOB. For RHC today  General:  sitting up in bed  No resp difficulty HEENT: normal Neck: supple. Jvp 8-9. Carotids 2+ bilat; no bruits. No lymphadenopathy or thryomegaly appreciated. Cor: PMI nondisplaced. Regular rate & rhythm. No rubs, gallops or murmurs. Lungs: clear Abdomen: obese soft, nontender, nondistended. No  hepatosplenomegaly. No bruits or masses. Good bowel sounds. Extremities: no cyanosis, clubbing, rash, edema Neuro: alert & orientedx3, cranial nerves grossly intact. moves all 4 extremities w/o difficulty. Affect pleasant  Remains milrinone  dependent. Co-ox marginal. Plan RHC today to help with next steps. May need to consider VAD if RV can tolerated given delayed transplant timeline.   Toribio Fuel, MD  8:09 AM

## 2023-09-21 ENCOUNTER — Encounter (HOSPITAL_COMMUNITY): Payer: Self-pay | Admitting: Internal Medicine

## 2023-09-21 DIAGNOSIS — N182 Chronic kidney disease, stage 2 (mild): Secondary | ICD-10-CM | POA: Diagnosis not present

## 2023-09-21 DIAGNOSIS — Z87898 Personal history of other specified conditions: Secondary | ICD-10-CM | POA: Diagnosis not present

## 2023-09-21 DIAGNOSIS — I10 Essential (primary) hypertension: Secondary | ICD-10-CM | POA: Diagnosis not present

## 2023-09-21 DIAGNOSIS — I5023 Acute on chronic systolic (congestive) heart failure: Secondary | ICD-10-CM | POA: Diagnosis not present

## 2023-09-21 LAB — CBC
HCT: 37.6 % — ABNORMAL LOW (ref 39.0–52.0)
Hemoglobin: 12.6 g/dL — ABNORMAL LOW (ref 13.0–17.0)
MCH: 28.4 pg (ref 26.0–34.0)
MCHC: 33.5 g/dL (ref 30.0–36.0)
MCV: 84.9 fL (ref 80.0–100.0)
Platelets: 289 K/uL (ref 150–400)
RBC: 4.43 MIL/uL (ref 4.22–5.81)
RDW: 15.9 % — ABNORMAL HIGH (ref 11.5–15.5)
WBC: 10.2 K/uL (ref 4.0–10.5)
nRBC: 0 % (ref 0.0–0.2)

## 2023-09-21 LAB — COOXEMETRY PANEL
Carboxyhemoglobin: 1.6 % — ABNORMAL HIGH (ref 0.5–1.5)
Methemoglobin: <0.7 % (ref 0.0–1.5)
O2 Saturation: 70.9 %
Total hemoglobin: 13.3 g/dL (ref 12.0–16.0)

## 2023-09-21 LAB — BASIC METABOLIC PANEL WITH GFR
Anion gap: 11 (ref 5–15)
BUN: 13 mg/dL (ref 6–20)
CO2: 25 mmol/L (ref 22–32)
Calcium: 8.7 mg/dL — ABNORMAL LOW (ref 8.9–10.3)
Chloride: 99 mmol/L (ref 98–111)
Creatinine, Ser: 1.52 mg/dL — ABNORMAL HIGH (ref 0.61–1.24)
GFR, Estimated: 60 mL/min — ABNORMAL LOW (ref >60)
Glucose, Bld: 105 mg/dL — ABNORMAL HIGH (ref 70–99)
Potassium: 3.2 mmol/L — ABNORMAL LOW (ref 3.5–5.1)
Sodium: 135 mmol/L (ref 135–145)

## 2023-09-21 LAB — MAGNESIUM: Magnesium: 2.1 mg/dL (ref 1.7–2.4)

## 2023-09-21 MED ORDER — POTASSIUM CHLORIDE CRYS ER 20 MEQ PO TBCR
40.0000 meq | EXTENDED_RELEASE_TABLET | Freq: Two times a day (BID) | ORAL | Status: DC
Start: 2023-09-22 — End: 2023-09-21

## 2023-09-21 MED ORDER — POTASSIUM CHLORIDE CRYS ER 20 MEQ PO TBCR
40.0000 meq | EXTENDED_RELEASE_TABLET | Freq: Once | ORAL | Status: AC
  Administered 2023-09-21: 40 meq via ORAL
  Filled 2023-09-21: qty 2

## 2023-09-21 MED ORDER — LOSARTAN POTASSIUM 25 MG PO TABS
12.5000 mg | ORAL_TABLET | Freq: Every day | ORAL | Status: DC
Start: 2023-09-21 — End: 2023-09-27
  Administered 2023-09-21 – 2023-09-27 (×7): 12.5 mg via ORAL
  Filled 2023-09-21 (×7): qty 1

## 2023-09-21 MED ORDER — POTASSIUM CHLORIDE CRYS ER 20 MEQ PO TBCR
40.0000 meq | EXTENDED_RELEASE_TABLET | Freq: Two times a day (BID) | ORAL | Status: DC
  Administered 2023-09-21 (×2): 40 meq via ORAL
  Filled 2023-09-21 (×2): qty 2

## 2023-09-21 MED ORDER — MEXILETINE HCL 200 MG PO CAPS
200.0000 mg | ORAL_CAPSULE | Freq: Two times a day (BID) | ORAL | Status: DC
  Administered 2023-09-21 – 2023-09-27 (×13): 200 mg via ORAL
  Filled 2023-09-21 (×15): qty 1

## 2023-09-21 NOTE — Progress Notes (Addendum)
 Advanced Heart Failure Rounding Note   Subjective:    RHC yesterday with elevated filling pressures and mild to moderately reduced CO on milrinone  0.25  On milrinone  0.25 mcg/kg/min     RA = 6 RV = 50/10 PA = 45/28 (31) PCW = 30 Fick cardiac output/index = 5.5/2.3 PVR = 0.2 WU Ao sat = 95% PA sat = 57%, 61% PAPi = 2.8   Lasix  gtt restarted. Out 5.3L Weight down 3 pounds. Coox 71% CVP 3-4  K 3.2 Scr stable 1.5   Objective:    Vital Signs:   Temp:  [98.2 F (36.8 C)-99.5 F (37.5 C)] 98.8 F (37.1 C) (09/09 0328) Pulse Rate:  [108-120] 108 (09/08 1554) Resp:  [10-20] 18 (09/09 0328) BP: (102-136)/(77-106) 105/90 (09/09 0328) SpO2:  [93 %-98 %] 97 % (09/09 0328) Weight:  [119.1 kg] 119.1 kg (09/09 0400) Last BM Date : 09/20/23  Weight change: Filed Weights   09/19/23 0013 09/20/23 0500 09/21/23 0400  Weight: 121.8 kg 120.2 kg 119.1 kg    Intake/Output:   Intake/Output Summary (Last 24 hours) at 09/21/2023 0640 Last data filed at 09/21/2023 0331 Gross per 24 hour  Intake 676.97 ml  Output 5250 ml  Net -4573.03 ml     Physical Exam: General:  lying in bed No resp difficulty HEENT: normal Neck: supple. JVP 5. Carotids 2+ bilat; no bruits. No lymphadenopathy or thryomegaly appreciated. Cor: Reg tachy Lungs: clear Abdomen: obese soft, nontender, nondistended. Good bowel sounds. Extremities: no cyanosis, clubbing, rash, edema Neuro: alert & orientedx3, cranial nerves grossly intact. moves all 4 extremities w/o difficulty. Affect pleasant   Telemetry:  ST 100-110 Personally reviewed   Labs: Basic Metabolic Panel: Recent Labs  Lab 09/18/23 0237 09/19/23 0557 09/19/23 1657 09/20/23 0712 09/20/23 0836 09/20/23 0838 09/20/23 0840 09/21/23 0348  NA 135 135 131* 136 138 139 141 135  K 3.2* 2.8* 3.2* 4.0 3.7 3.6 3.5 3.2*  CL 99 97* 94* 101  --   --   --  99  CO2 23 27 25 26   --   --   --  25  GLUCOSE 143* 106* 118* 107*  --   --   --  105*  BUN 20  14 16 14   --   --   --  13  CREATININE 1.94* 1.61* 1.65* 1.50*  --   --   --  1.52*  CALCIUM 8.5* 8.0* 8.5* 8.8*  --   --   --  8.7*  MG 1.8 2.3  --  2.3  --   --   --  2.1    Liver Function Tests: Recent Labs  Lab 09/16/23 2353 09/18/23 0237  AST 45* 87*  ALT 34 77*  ALKPHOS 47 45  BILITOT 5.1* 5.0*  PROT 7.5 7.5  ALBUMIN 3.5 3.4*   No results for input(s): LIPASE, AMYLASE in the last 168 hours. No results for input(s): AMMONIA in the last 168 hours.  CBC: Recent Labs  Lab 09/16/23 2353 09/17/23 0005 09/18/23 9762 09/19/23 0557 09/20/23 9287 09/20/23 9163 09/20/23 0838 09/20/23 0840 09/21/23 0348  WBC 10.8*  --  13.2* 12.2* 12.1*  --   --   --  10.2  HGB 11.8*   < > 11.8* 11.8* 11.9* 11.9* 11.9* 11.6* 12.6*  HCT 36.8*   < > 35.2* 34.4* 35.8* 35.0* 35.0* 34.0* 37.6*  MCV 87.6  --  85.2 84.5 85.2  --   --   --  84.9  PLT 247  --  245 249 266  --   --   --  289   < > = values in this interval not displayed.    Cardiac Enzymes: No results for input(s): CKTOTAL, CKMB, CKMBINDEX, TROPONINI in the last 168 hours.  BNP: BNP (last 3 results) Recent Labs    06/21/23 1509 07/20/23 1154 09/16/23 2353  BNP 673.5* 925.3* 2,277.9*    ProBNP (last 3 results) No results for input(s): PROBNP in the last 8760 hours.    Other results:  Imaging: CARDIAC CATHETERIZATION Result Date: 09/20/2023 Findings: On milrinone  0.25 mcg/kg/min RA = 6 RV = 50/10 PA = 45/28 (31) PCW = 30 Fick cardiac output/index = 5.5/2.3 PVR = 0.2 WU Ao sat = 95% PA sat = 57%, 61% PAPi = 2.8 Assessment: 1. Persistent volume overload 2. Mild to moderately decreased CO on milrinone  support 3. Moderate RV dysfunction Plan/Discussion: Continue milrinone  and diuresis. Given RV numbers may be LVAD candidate with milrinone  support. Xavius Spadafore, MD 9:03 AM     Medications:     Scheduled Medications:  apixaban   5 mg Oral BID   ascorbic acid   500 mg Oral Daily   Chlorhexidine   Gluconate Cloth  6 each Topical Daily   digoxin   0.0625 mg Oral Daily   empagliflozin   10 mg Oral Daily   polyethylene glycol  17 g Oral Daily   sodium chloride  flush  10-40 mL Intracatheter Q12H   sodium chloride  flush  3 mL Intravenous Q12H   sodium chloride  flush  3 mL Intravenous Q12H   spironolactone   12.5 mg Oral Daily    Infusions:  sodium chloride      furosemide  (LASIX ) 200 mg in dextrose  5 % 100 mL (2 mg/mL) infusion 10 mg/hr (09/21/23 0340)   milrinone  0.25 mcg/kg/min (09/20/23 2038)    PRN Medications: sodium chloride , acetaminophen , ondansetron  (ZOFRAN ) IV, sodium chloride  flush, sodium chloride  flush, sodium chloride  flush   Assessment:   1. A/C Biventricular HFrEF , NICM  - Initially diagnosed with HFrEF in 2023.  Left heart catheter time with no CAD.  [facility] July 2025 --LVEF 15% RV severely reduced.  - July 2023 CPX VO2 12.7 (43%) 19 when adjusted  to ideal body weight. Slope 42 - Low output on admit Coox 38%  - RHC yesterday with elevated filling pressures and mild to moderately reduced CO (CI 2.3) on milrinone  0.25 - No bb with suspected low output. - Continue digoxin  0.0625 - Continue  jardiance  10 mg daily  - Continue spiro 12.5 mg daily - Add losartan  12.5 - Continue IV lasix  one more day then begin milrinone  wean. If fails wean will need VAD eval - Long talk about need and candidacy for advanced therapies. Currently not a candidate for transplant with tobacco use. We discussed smoking cessation. Needs to avoid nicotine  6 months for consideration.  He was seen by VAD coordinators in June for possible VAD. RV dysfunction may preclude VAD. Discussed directly that he needs to be more engaged in his own care to be candidate for advanced therapies   2. AKI due to cardiorenal syndrome Baseline creatinine ~ 1.3-1.4. On admit 1.9.  - Scr stable at 1.5   3. Severe Sleep Apnea AHI 54 Desaturation 78%, CPAP was recommended.  Outpatient Pulmonary working on CPAP.    4. Tobacco Abuse - Recently started smoking again.  - Discussed cessation as above   5. LV Thrombus  - He has been on eliquis  for possible LV thrombus noted  02/2023. - Continue Eliquis    6. Obesity - Body mass index is 35.61 kg/m. - seen by Nutitionist (thanks)  7. Hypokalemia - Restarted spiro - K 3.2 will supp   Length of Stay: 4   Caylyn Tedeschi MD 09/21/2023, 6:40 AM  Advanced Heart Failure Team Pager [phone] (M-F; 7a - 4p)  Please contact [facility] for night-coverage after hours (4p -7a ) and weekends on [email or web address]

## 2023-09-21 NOTE — Plan of Care (Signed)

## 2023-09-21 NOTE — Progress Notes (Signed)
 Progress Note   Patient: Robert Daniels FMW:[id number] DOB: 02-07-1985 DOA: 09/16/2023     4 DOS: the patient was seen and examined on 09/21/2023   Brief hospital course: Mr. Wurzer was admitted to the hospital with the working diagnosis of heart failure exacerbation.   38 yo male with the past medical history of heart failure, LV thrombus, CKD, substance abuse and obesity who presented with dyspnea.  08/08 outpatient follow up with heart failure clinic, he was found euvolemic, he was placed on entresto  and torsemide  was decreased from 80 to 40 mg daily    Reported worsening dyspnea for the last 3 days and about 15 lbs weight gain overt the last 48 hrs prior to admission.  On his initial physical examination his blood pressure was 137/101, HR 116, RR 19 and 02 saturation 99%  Lungs with bilateral rales with no wheezing, heart with S1 and S2 present and regular, no gallops, abdomen with no distention and positive lower extremity edema.   Na 134, K 3,9 Cl 100, bicarbonate 22, glucose 122 bun 22 cr 1,86  AST 45 and ALT 34, total bilirubin 5.1 (indirect 4.6)  BNP 2,277  High sensitive troponin 53 and 58 Lactic acid 2,8 and 2,5  Wbc 10,8 hgb 11.8 plt 247   Sars COVID 19 negative Influenza negative RSV negative   Chest radiograph with cardiomegaly, bilateral hilar vascular congestion and bilateral cental interstitial infiltrates, fluid in the right fissure, no effusions.   EKG 113 bpm, normal axis, normal intervals, qtc 466, sinus rhythm with bilateral atrial enlargement, poor RR wave progression, no ST segment changes, negative T waves lead I, aVL, V5-V6.   09/06 patient responding well to aggressive diuresis.  09/07 improved volume status, stopped furosemide , plan for cardiac catheterization in am.  09/08 cardiac catheterization with elevated filling pressures.   Assessment and Plan: * Acute on chronic systolic CHF (congestive heart failure) (HCC) Echocardiogram from 02/ 2025,  reduced LV systolic function to 15%, with global hypokinesis, severe LV dilatation, moderate RV systolic function reduction, RVSP 29.7 mmHg, LA and RA with mild dilatation, no pericardial effusion.  09/08 cardiac catheterization  RA 6 RV 50/10 PA 45/28 mean 31  PCWP 30  Cardiac output 5.5 and index 2.3 (on milrinone  0,25 mcg)  PVR 0.2 Wood Units.   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   Urine output is 5.250 ml Systolic blood pressure  100  mmHg range.  Sv02 70.9 CVP 2  Continue with furosemide  drip 10 mg per hr  Continue with milrinone  for inotropic support 0.25 mcg/kg/min  Continue SGLT 2 inh, spironolactone  and digoxin .  Afterload reduction with losartan   LV thrombus, continue with apixaban  for anticoagulation.   Ectopy added mexiletine   HTN (hypertension) Continue blood pressure monitoring Patient on milrinone  Diuresis with furosemide  Afterload reduction with losartan .   CKD (chronic kidney disease) stage 2, GFR 60-89 ml/min AKI, Hyponatremia. Hypokalemia   Renal function today with serum cr at 1,52 with K at 3,2 and serum bicarbonate at 25  Na 135   Continue40 Kcl to prevent hypokalemia  Follow up renal function and electrolytes in am. Avoid hypotension and nephrotoxic medications.   History of substance use disorder Toxicology scree negative   Obesity, class 2 Calculated BMI is 38,4        Subjective: patient is feeling better, edema is improving along with dyspnea   Physical Exam: Vitals:   09/21/23 0400 09/21/23 0755 09/21/23 1104 09/21/23 1556  BP:  (!) 94/59  105/75 100/76  Pulse:      Resp:  20 19 19   Temp:  98.6 F (37 C) 98.6 F (37 C) 98.6 F (37 C)  TempSrc:  Oral Oral Oral  SpO2:  97% 98% 100%  Weight: 119.1 kg     Height:       Neurology awake and alert ENT with mild pallor Cardiovascular with S1 and S2 present and regular with no gallops, rubs or murmurs No JVD Respiratory with no rales or  wheezing, no rhonchi  Abdomen with no distention  No lower extremity edema  Data Reviewed:    Family Communication: no family at the bedside   Disposition: Status is: Inpatient Remains inpatient appropriate because: heart failure   Planned Discharge Destination: Home   Author: Elidia Toribio Furnace, MD 09/21/2023 6:18 PM  For on call review [email or web address].

## 2023-09-21 NOTE — Progress Notes (Signed)
 PVC burden assessed on tele. Having 11-23/hr. Discussed with Dr. Bensimhon, plan to start Mexiletine 200 mg BID.   Beckey Coe, AGACNP-BC  Advanced Heart Failure Team  09/21/23

## 2023-09-22 ENCOUNTER — Telehealth (HOSPITAL_COMMUNITY): Payer: Self-pay | Admitting: Pharmacy Technician

## 2023-09-22 ENCOUNTER — Other Ambulatory Visit (HOSPITAL_COMMUNITY): Payer: Self-pay

## 2023-09-22 DIAGNOSIS — I5023 Acute on chronic systolic (congestive) heart failure: Secondary | ICD-10-CM | POA: Diagnosis not present

## 2023-09-22 LAB — CBC
HCT: 38.5 % — ABNORMAL LOW (ref 39.0–52.0)
Hemoglobin: 12.8 g/dL — ABNORMAL LOW (ref 13.0–17.0)
MCH: 28.3 pg (ref 26.0–34.0)
MCHC: 33.2 g/dL (ref 30.0–36.0)
MCV: 85 fL (ref 80.0–100.0)
Platelets: 334 K/uL (ref 150–400)
RBC: 4.53 MIL/uL (ref 4.22–5.81)
RDW: 15.9 % — ABNORMAL HIGH (ref 11.5–15.5)
WBC: 9.7 K/uL (ref 4.0–10.5)
nRBC: 0 % (ref 0.0–0.2)

## 2023-09-22 LAB — BASIC METABOLIC PANEL WITH GFR
Anion gap: 11 (ref 5–15)
BUN: 14 mg/dL (ref 6–20)
CO2: 26 mmol/L (ref 22–32)
Calcium: 8.8 mg/dL — ABNORMAL LOW (ref 8.9–10.3)
Chloride: 99 mmol/L (ref 98–111)
Creatinine, Ser: 1.5 mg/dL — ABNORMAL HIGH (ref 0.61–1.24)
GFR, Estimated: >60 mL/min (ref >60)
Glucose, Bld: 103 mg/dL — ABNORMAL HIGH (ref 70–99)
Potassium: 3.5 mmol/L (ref 3.5–5.1)
Sodium: 136 mmol/L (ref 135–145)

## 2023-09-22 LAB — COOXEMETRY PANEL
Carboxyhemoglobin: 1.2 % (ref 0.5–1.5)
Methemoglobin: 0.9 % (ref 0.0–1.5)
O2 Saturation: 65.7 %
Total hemoglobin: 13.2 g/dL (ref 12.0–16.0)

## 2023-09-22 LAB — HEMOGLOBIN A1C
Hgb A1c MFr Bld: 5.4 % (ref 4.8–5.6)
Mean Plasma Glucose: 108.28 mg/dL

## 2023-09-22 LAB — MAGNESIUM: Magnesium: 2.1 mg/dL (ref 1.7–2.4)

## 2023-09-22 LAB — DIGOXIN LEVEL: Digoxin Level: <0.6 ng/mL — ABNORMAL LOW (ref 0.8–2.0)

## 2023-09-22 MED ORDER — POTASSIUM CHLORIDE CRYS ER 20 MEQ PO TBCR
40.0000 meq | EXTENDED_RELEASE_TABLET | ORAL | Status: AC
  Administered 2023-09-22 (×2): 40 meq via ORAL
  Filled 2023-09-22 (×2): qty 2

## 2023-09-22 MED ORDER — SPIRONOLACTONE 25 MG PO TABS
25.0000 mg | ORAL_TABLET | Freq: Every day | ORAL | Status: DC
  Administered 2023-09-22 – 2023-09-27 (×6): 25 mg via ORAL
  Filled 2023-09-22 (×6): qty 1

## 2023-09-22 NOTE — Progress Notes (Addendum)
 Advanced Heart Failure Rounding Note   Subjective:    RHC 09/08 on milrinone  0.25 RA = 6 RV = 50/10 PA = 45/28 (31) PCW = 30 Fick cardiac output/index = 5.5/2.3 PVR = 0.2 WU Ao sat = 95% PA sat = 57%, 61% PAPi = 2.8   CO-OX 66% on 0.25 milrinone .  2.8L UOP charted last 24 hrs w/ lasix  gtt at 10 mg/hr. ? Weight up. CVP down to 1.   Feeling well. No dyspnea, orthopnea or PND.   Objective:    Vital Signs:   Temp:  [97.9 F (36.6 C)-98.6 F (37 C)] 98.6 F (37 C) (09/10 0316) Pulse Rate:  [110-118] 118 (09/10 0316) Resp:  [19-20] 19 (09/10 0316) BP: (94-107)/(59-83) 106/83 (09/10 0316) SpO2:  [97 %-100 %] 100 % (09/10 0316) Weight:  [120 kg] 120 kg (09/10 0316) Last BM Date : 09/21/23  Weight change: Filed Weights   09/20/23 0500 09/21/23 0400 09/22/23 0316  Weight: 120.2 kg 119.1 kg 120 kg    Intake/Output:   Intake/Output Summary (Last 24 hours) at 09/22/2023 0709 Last data filed at 09/22/2023 0600 Gross per 24 hour  Intake 792 ml  Output 2800 ml  Net -2008 ml     Physical Exam: General:  Well appearing. Lying flat in bed. Cor: No JVD. Regular rate & rhythm. No murmurs. Lungs: clear Abdomen: soft, nontender, nondistended.  Extremities: no edema Neuro: alert & orientedx3. Affect pleasant    Telemetry:  ST 100s, ~ 10 PVCs/min   Labs: Basic Metabolic Panel: Recent Labs  Lab 09/18/23 0237 09/19/23 0557 09/19/23 1657 09/20/23 0712 09/20/23 0836 09/20/23 0838 09/20/23 0840 09/21/23 0348 09/22/23 0510  NA 135 135 131* 136 138 139 141 135 136  K 3.2* 2.8* 3.2* 4.0 3.7 3.6 3.5 3.2* 3.5  CL 99 97* 94* 101  --   --   --  99 99  CO2 23 27 25 26   --   --   --  25 26  GLUCOSE 143* 106* 118* 107*  --   --   --  105* 103*  BUN 20 14 16 14   --   --   --  13 14  CREATININE 1.94* 1.61* 1.65* 1.50*  --   --   --  1.52* 1.50*  CALCIUM 8.5* 8.0* 8.5* 8.8*  --   --   --  8.7* 8.8*  MG 1.8 2.3  --  2.3  --   --   --  2.1 2.1    Liver Function  Tests: Recent Labs  Lab 09/16/23 2353 09/18/23 0237  AST 45* 87*  ALT 34 77*  ALKPHOS 47 45  BILITOT 5.1* 5.0*  PROT 7.5 7.5  ALBUMIN 3.5 3.4*   No results for input(s): LIPASE, AMYLASE in the last 168 hours. No results for input(s): AMMONIA in the last 168 hours.  CBC: Recent Labs  Lab 09/18/23 0237 09/19/23 0557 09/20/23 0712 09/20/23 0836 09/20/23 0838 09/20/23 0840 09/21/23 0348 09/22/23 0510  WBC 13.2* 12.2* 12.1*  --   --   --  10.2 9.7  HGB 11.8* 11.8* 11.9* 11.9* 11.9* 11.6* 12.6* 12.8*  HCT 35.2* 34.4* 35.8* 35.0* 35.0* 34.0* 37.6* 38.5*  MCV 85.2 84.5 85.2  --   --   --  84.9 85.0  PLT 245 249 266  --   --   --  289 334    Cardiac Enzymes: No results for input(s): CKTOTAL, CKMB, CKMBINDEX, TROPONINI in the last 168 hours.  BNP: BNP (last 3 results) Recent Labs    06/21/23 1509 07/20/23 1154 09/16/23 2353  BNP 673.5* 925.3* 2,277.9*    ProBNP (last 3 results) No results for input(s): PROBNP in the last 8760 hours.    Other results:  Imaging: CARDIAC CATHETERIZATION Result Date: 09/20/2023 Findings: On milrinone  0.25 mcg/kg/min RA = 6 RV = 50/10 PA = 45/28 (31) PCW = 30 Fick cardiac output/index = 5.5/2.3 PVR = 0.2 WU Ao sat = 95% PA sat = 57%, 61% PAPi = 2.8 Assessment: 1. Persistent volume overload 2. Mild to moderately decreased CO on milrinone  support 3. Moderate RV dysfunction Plan/Discussion: Continue milrinone  and diuresis. Given RV numbers may be LVAD candidate with milrinone  support. Jquan Egelston, MD 9:03 AM     Medications:     Scheduled Medications:  apixaban   5 mg Oral BID   ascorbic acid   500 mg Oral Daily   Chlorhexidine  Gluconate Cloth  6 each Topical Daily   digoxin   0.0625 mg Oral Daily   empagliflozin   10 mg Oral Daily   losartan   12.5 mg Oral Daily   mexiletine  200 mg Oral BID   polyethylene glycol  17 g Oral Daily   potassium chloride   40 mEq Oral Q4H   sodium chloride  flush  10-40 mL  Intracatheter Q12H   sodium chloride  flush  3 mL Intravenous Q12H   sodium chloride  flush  3 mL Intravenous Q12H   spironolactone   12.5 mg Oral Daily    Infusions:  furosemide  (LASIX ) 200 mg in dextrose  5 % 100 mL (2 mg/mL) infusion 10 mg/hr (09/21/23 2307)   milrinone  0.25 mcg/kg/min (09/22/23 0446)    PRN Medications: acetaminophen , ondansetron  (ZOFRAN ) IV, sodium chloride  flush, sodium chloride  flush, sodium chloride  flush   Assessment:   1. A/C Biventricular HFrEF , NICM  - Initially diagnosed with HFrEF in 2023.  Left heart catheter time with no CAD.  Echo [facility] July 2025 --LVEF 15% RV severely reduced.  - July 2023 CPX VO2 12.7 (43%) 19 when adjusted  to ideal body weight. Slope 42 - Low output on admit w/ Coox 38%  - RHC 9/09 with elevated filling pressures and mild to moderately reduced CO (CI 2.3) on milrinone  0.25 - CO-OX 66% on 0.25 milrinone . Wean milrinone  to 0.125. If fails wean may need to consider VAD eval sooner than later. - CVP 1. Stop lasix  gtt.  - No bb with suspected low output. - Continue digoxin  0.0625 - Continue  jardiance  10 mg daily  - Increase spiro to 25 mg daily - Continue losartan  12.5 - Long talk about need and candidacy for advanced therapies. Currently not a candidate for transplant with tobacco use. We discussed smoking cessation. Needs to avoid nicotine  6 months for consideration.  He was seen by VAD coordinators in June for possible VAD. RV dysfunction may preclude VAD. Discussed directly that he needs to be more engaged in his own care to be candidate for advanced therapies  2. PVCs - Started on Mexiletine 200 mg BID on 09/09  - Supp K    3. AKI due to cardiorenal syndrome Baseline creatinine ~ 1.3-1.4. On admit 1.9.  - Scr stable at 1.5   4. Severe Sleep Apnea AHI 54 Desaturation 78%, CPAP was recommended.  Outpatient Pulmonary working on CPAP.   5. Tobacco Abuse - Recently started smoking again.  - Discussed cessation as above   6.  LV Thrombus  - He has been on eliquis  for possible LV thrombus noted 02/2023. -  Continue Eliquis    7. Obesity - Body mass index is 35.88 kg/m. - seen by Nutitionist (thanks)  8. Hypokalemia - Increase spiro - Supp K  Length of Stay: 5   FINCH, LINDSAY N MD 09/22/2023, 7:09 AM  Advanced Heart Failure Team Pager [phone] (M-F; 7a - 4p)  Please contact [facility] for night-coverage after hours (4p -7a ) and weekends on [email or web address]   Patient seen and examined with the above-signed Advanced Practice Provider and/or Housestaff. I personally reviewed laboratory data, imaging studies and relevant notes. I independently examined the patient and formulated the important aspects of the plan. I have edited the note to reflect any of my changes or salient points. I have personally discussed the plan with the patient and/or family.  Remains on milrinone  0.25. He has been fully diuresed. Co-ox 66%  General:  lying in bed No resp difficulty HEENT: normal Neck: supple. no JVD. Carotids 2+ bilat; no bruits. No lymphadenopathy or thryomegaly appreciated. Cor: PMI nondisplaced. Regular rate & rhythm. No rubs, gallops or murmurs. Lungs: clear Abdomen: soft, nontender, nondistended. No hepatosplenomegaly. No bruits or masses. Good bowel sounds. Extremities: no cyanosis, clubbing, rash, edema Neuro: alert & orientedx3, cranial nerves grossly intact. moves all 4 extremities w/o difficulty. Affect pleasant   Stop lasix  gtt. Begin milrinone  wean. Follow co-ox. If fails milrinone  wean will need to consider VAD. Continue mexilitene for PVCs. Titrate GDMT as tolerated. Resume oral diuretics tomorrow or the next day.   Toribio Fuel, MD  10:56 AM

## 2023-09-22 NOTE — Progress Notes (Addendum)
 PROGRESS NOTE    Robert Daniels  FMW:[id number] DOB: August 06, 1985 DOA: 09/16/2023 PCP: Lorren Greig PARAS, NP  38/M with history of chronic systolic CHF, NICM, LV thrombus, CKD 3, substance abuse and obesity admitted with low output CHF.  Creatinine 1.8 on admission, BNP 2277, troponin 53, 58, lactate 2.8, chest x-ray with CHF - Initial Co. ox was low at 38, started on milrinone  - Improving with diuresis   Subjective: -Feels better overall  Assessment and Plan:  Acute on chronic biventricular failure, low output - Known NICM -Admitted with low output CHF, Co. ox low of 38 initially, improving with diuresis, milrinone  -16.5 L negative -RHC 9/9 with elevated filling pressures, moderately reduced cardiac output on milrinone  - Remains on IV Lasix , continue digoxin , Jardiance , Aldactone , starting losartan  today -eats fast food and ultra processed food every day, counseled, dietitian consulted   AKI on CKD 2 - Cardiorenal, improving, close to baseline now  Hyperglycemia Check HbA1c  Known severe OSA -Followed by pulmonary, CPAP was recommended.  - TOC consult   Tobacco Abuse - Counseled   History of LV Thrombus  - Continue Eliquis    Hypokalemia Replete  History of substance use disorder Toxicology screen negative this time  Obesity, class 2 Calculated BMI is 38,4   DVT prophylaxis: Code Status:  Family Communication: Disposition Plan:   Consultants:    Procedures:   Antimicrobials:    Objective: Vitals:   09/21/23 2312 09/22/23 0316 09/22/23 0719 09/22/23 1000  BP: 106/75 106/83 102/73 97/75  Pulse: (!) 110 (!) 118 (!) 102 (!) 108  Resp: 20 19 20    Temp: 97.9 F (36.6 C) 98.6 F (37 C) 98.5 F (36.9 C)   TempSrc: Oral Oral Oral   SpO2: 100% 100% 97%   Weight:  120 kg    Height:        Intake/Output Summary (Last 24 hours) at 09/22/2023 1150 Last data filed at 09/22/2023 0900 Gross per 24 hour  Intake 772 ml  Output 2000 ml  Net -1228 ml   Filed  Weights   09/20/23 0500 09/21/23 0400 09/22/23 0316  Weight: 120.2 kg 119.1 kg 120 kg    Examination:  General exam: Appears calm and comfortable  HEENT: Positive JVD Respiratory system: Clear to auscultation Cardiovascular system: S1 & S2 heard, RRR.  Abd: nondistended, soft and nontender.Normal bowel sounds heard. Central nervous system: Alert and oriented. No focal neurological deficits. Extremities: 1+ edema, PICC line Skin: No rashes Psychiatry:  Mood & affect appropriate.     Data Reviewed:   CBC: Recent Labs  Lab 09/18/23 0237 09/19/23 0557 09/20/23 9287 09/20/23 0836 09/20/23 0838 09/20/23 0840 09/21/23 0348 09/22/23 0510  WBC 13.2* 12.2* 12.1*  --   --   --  10.2 9.7  HGB 11.8* 11.8* 11.9* 11.9* 11.9* 11.6* 12.6* 12.8*  HCT 35.2* 34.4* 35.8* 35.0* 35.0* 34.0* 37.6* 38.5*  MCV 85.2 84.5 85.2  --   --   --  84.9 85.0  PLT 245 249 266  --   --   --  289 334   Basic Metabolic Panel: Recent Labs  Lab 09/18/23 0237 09/19/23 0557 09/19/23 1657 09/20/23 0712 09/20/23 0836 09/20/23 0838 09/20/23 0840 09/21/23 0348 09/22/23 0510  NA 135 135 131* 136 138 139 141 135 136  K 3.2* 2.8* 3.2* 4.0 3.7 3.6 3.5 3.2* 3.5  CL 99 97* 94* 101  --   --   --  99 99  CO2 23 27 25 26   --   --   --  25 26  GLUCOSE 143* 106* 118* 107*  --   --   --  105* 103*  BUN 20 14 16 14   --   --   --  13 14  CREATININE 1.94* 1.61* 1.65* 1.50*  --   --   --  1.52* 1.50*  CALCIUM 8.5* 8.0* 8.5* 8.8*  --   --   --  8.7* 8.8*  MG 1.8 2.3  --  2.3  --   --   --  2.1 2.1   GFR: Estimated Creatinine Clearance: 89.3 mL/min (A) (by C-G formula based on SCr of 1.5 mg/dL (H)). Liver Function Tests: Recent Labs  Lab 09/16/23 2353 09/18/23 0237  AST 45* 87*  ALT 34 77*  ALKPHOS 47 45  BILITOT 5.1* 5.0*  PROT 7.5 7.5  ALBUMIN 3.5 3.4*   No results for input(s): LIPASE, AMYLASE in the last 168 hours. No results for input(s): AMMONIA in the last 168 hours. Coagulation Profile: No  results for input(s): INR, PROTIME in the last 168 hours. Cardiac Enzymes: No results for input(s): CKTOTAL, CKMB, CKMBINDEX, TROPONINI in the last 168 hours. BNP (last 3 results) No results for input(s): PROBNP in the last 8760 hours. HbA1C: Recent Labs    09/22/23 0500  HGBA1C 5.4   CBG: No results for input(s): GLUCAP in the last 168 hours. Lipid Profile: No results for input(s): CHOL, HDL, LDLCALC, TRIG, CHOLHDL, LDLDIRECT in the last 72 hours. Thyroid Function Tests: No results for input(s): TSH, T4TOTAL, FREET4, T3FREE, THYROIDAB in the last 72 hours. Anemia Panel: No results for input(s): VITAMINB12, FOLATE, FERRITIN, TIBC, IRON, RETICCTPCT in the last 72 hours. Urine analysis:    Component Value Date/Time   COLORURINE STRAW (A) 06/10/2023 2209   APPEARANCEUR CLEAR 06/10/2023 2209   LABSPEC 1.002 (L) 06/10/2023 2209   PHURINE 7.0 06/10/2023 2209   GLUCOSEU >=500 (A) 06/10/2023 2209   HGBUR NEGATIVE 06/10/2023 2209   BILIRUBINUR NEGATIVE 06/10/2023 2209   KETONESUR NEGATIVE 06/10/2023 2209   PROTEINUR NEGATIVE 06/10/2023 2209   UROBILINOGEN 0.2 04/09/2012 0651   NITRITE NEGATIVE 06/10/2023 2209   LEUKOCYTESUR NEGATIVE 06/10/2023 2209   Sepsis Labs: @LABRCNTIP (procalcitonin:4,lacticidven:4)  ) Recent Results (from the past 240 hours)  Resp panel by RT-PCR (RSV, Flu A&B, Covid) Anterior Nasal Swab     Status: None   Collection Time: 09/16/23 11:59 PM   Specimen: Anterior Nasal Swab  Result Value Ref Range Status   SARS Coronavirus 2 by RT PCR NEGATIVE NEGATIVE Final   Influenza A by PCR NEGATIVE NEGATIVE Final   Influenza B by PCR NEGATIVE NEGATIVE Final    Comment: (NOTE) The Xpert Xpress SARS-CoV-2/FLU/RSV plus assay is intended as an aid in the diagnosis of influenza from Nasopharyngeal swab specimens and should not be used as a sole basis for treatment. Nasal washings and aspirates are unacceptable for Xpert  Xpress SARS-CoV-2/FLU/RSV testing.  Fact Sheet for Patients: BloggerCourse.com  Fact Sheet for Healthcare Providers: SeriousBroker.it  This test is not yet approved or cleared by the [place]  FDA and has been authorized for detection and/or diagnosis of SARS-CoV-2 by FDA under an Emergency Use Authorization (EUA). This EUA will remain in effect (meaning this test can be used) for the duration of the COVID-19 declaration under Section 564(b)(1) of the Act, 21 U.S.C. section 360bbb-3(b)(1), unless the authorization is terminated or revoked.     Resp Syncytial Virus by PCR NEGATIVE NEGATIVE Final    Comment: (NOTE) Fact Sheet for Patients: BloggerCourse.com  Fact  Sheet for Healthcare Providers: SeriousBroker.it  This test is not yet approved or cleared by the [place]  FDA and has been authorized for detection and/or diagnosis of SARS-CoV-2 by FDA under an Emergency Use Authorization (EUA). This EUA will remain in effect (meaning this test can be used) for the duration of the COVID-19 declaration under Section 564(b)(1) of the Act, 21 U.S.C. section 360bbb-3(b)(1), unless the authorization is terminated or revoked.  Performed at [facility], [address] [place], [place] [address]      Radiology Studies: No results found.   Scheduled Meds:  apixaban   5 mg Oral BID   ascorbic acid   500 mg Oral Daily   Chlorhexidine  Gluconate Cloth  6 each Topical Daily   digoxin   0.0625 mg Oral Daily   empagliflozin   10 mg Oral Daily   losartan   12.5 mg Oral Daily   mexiletine  200 mg Oral BID   polyethylene glycol  17 g Oral Daily   potassium chloride   40 mEq Oral Q4H   sodium chloride  flush  10-40 mL Intracatheter Q12H   sodium chloride  flush  3 mL Intravenous Q12H   sodium chloride  flush  3 mL Intravenous Q12H   spironolactone   25 mg Oral Daily   Continuous  Infusions:  milrinone  0.125 mcg/kg/min (09/22/23 1042)     LOS: 5 days    Time spent:    Sigurd Pac, MD [facility]   09/22/2023, 11:50 AM

## 2023-09-22 NOTE — Telephone Encounter (Signed)
 Patient Product/process development scientist completed.    The patient is insured through [employer].     Ran test claim for mexiletine 200 mg  and the current 30 day co-pay is $4.00.   This test claim was processed through [facility]- copay amounts may vary at other pharmacies due to pharmacy/plan contracts, or as the patient moves through the different stages of their insurance plan.     Reyes Sharps, CPHT Pharmacy Technician III Certified Patient Advocate [facility] Patient Advocate Team Direct Number: [phone]  Fax: [phone]

## 2023-09-23 DIAGNOSIS — I5023 Acute on chronic systolic (congestive) heart failure: Secondary | ICD-10-CM | POA: Diagnosis not present

## 2023-09-23 LAB — COOXEMETRY PANEL
Carboxyhemoglobin: 1.8 % — ABNORMAL HIGH (ref 0.5–1.5)
Methemoglobin: 0.7 % (ref 0.0–1.5)
O2 Saturation: 56.8 %
Total hemoglobin: 12.7 g/dL (ref 12.0–16.0)

## 2023-09-23 LAB — BASIC METABOLIC PANEL WITH GFR
Anion gap: 9 (ref 5–15)
BUN: 11 mg/dL (ref 6–20)
CO2: 25 mmol/L (ref 22–32)
Calcium: 8.9 mg/dL (ref 8.9–10.3)
Chloride: 99 mmol/L (ref 98–111)
Creatinine, Ser: 1.45 mg/dL — ABNORMAL HIGH (ref 0.61–1.24)
GFR, Estimated: >60 mL/min (ref >60)
Glucose, Bld: 136 mg/dL — ABNORMAL HIGH (ref 70–99)
Potassium: 4 mmol/L (ref 3.5–5.1)
Sodium: 133 mmol/L — ABNORMAL LOW (ref 135–145)

## 2023-09-23 LAB — MAGNESIUM: Magnesium: 2.2 mg/dL (ref 1.7–2.4)

## 2023-09-23 NOTE — Progress Notes (Signed)
 PROGRESS NOTE    Robert Daniels  FMW:[id number] DOB: [birth year] DOA: 09/16/2023 PCP: Lorren Greig PARAS, NP  38/M with history of chronic systolic CHF, NICM, LV thrombus, CKD 3, substance abuse and obesity admitted with low output CHF.  Creatinine 1.8 on admission, BNP 2277, troponin 53, 58, lactate 2.8, chest x-ray with CHF - Initial Co. ox was low at 38, started on milrinone  - Improving with diuresis   Subjective: - Feels better, ambulating without symptoms  Assessment and Plan:  Acute on chronic biventricular failure, low output - Known NICM -Admitted with low output CHF, Co. ox low of 38 initially, improving with diuresis, milrinone  -17.5 L negative, appears euvolemic now -RHC 9/9 with elevated filling pressures, moderately reduced cardiac output on milrinone  - Holding diuretics today, continue digoxin , Jardiance , Aldactone , losartan  -eats fast food and ultra processed food every day, counseled, dietitian consult appreciated -Co. ox down to 56 this morning, on lower dose milrinone , may need repeat   AKI on CKD 2 - Cardiorenal, improving, close to baseline now  Hyperglycemia A1c is 5.4  Known severe OSA -Followed by pulmonary, CPAP was recommended.  - TOC working on home CPAP   Tobacco Abuse - Counseled   History of LV Thrombus  - Continue Eliquis    Hypokalemia Repleted  History of substance use disorder Toxicology screen negative this time  Obesity, class 2 Calculated BMI is 38,4   DVT prophylaxis: Eliquis  Code Status: Full code Family Communication: None present Disposition Plan:   Consultants:    Procedures:   Antimicrobials:    Objective: Vitals:   09/23/23 0307 09/23/23 0309 09/23/23 0715 09/23/23 1059  BP: 104/83  100/75 102/69  Pulse: 100  100 99  Resp: 20  17 15   Temp: 97.7 F (36.5 C)  98.5 F (36.9 C) 98.3 F (36.8 C)  TempSrc: Oral  Oral Oral  SpO2: 99% 100% 99% 100%  Weight:  121 kg    Height:        Intake/Output Summary  (Last 24 hours) at 09/23/2023 1138 Last data filed at 09/23/2023 0803 Gross per 24 hour  Intake 373 ml  Output 1300 ml  Net -927 ml   Filed Weights   09/21/23 0400 09/22/23 0316 09/23/23 0309  Weight: 119.1 kg 120 kg 121 kg    Examination:  General exam: AAO x 3, no distress HEENT: no JVD Respiratory system: Clear to auscultation Cardiovascular system: S1 & S2 heard, RRR.  Abd: nondistended, soft and nontender.Normal bowel sounds heard. Central nervous system: Alert and oriented. No focal neurological deficits. Extremities: Trace edema, PICC line Skin: No rashes Psychiatry:  Mood & affect appropriate.     Data Reviewed:   CBC: Recent Labs  Lab 09/18/23 0237 09/19/23 0557 09/20/23 9287 09/20/23 0836 09/20/23 0838 09/20/23 0840 09/21/23 0348 09/22/23 0510  WBC 13.2* 12.2* 12.1*  --   --   --  10.2 9.7  HGB 11.8* 11.8* 11.9* 11.9* 11.9* 11.6* 12.6* 12.8*  HCT 35.2* 34.4* 35.8* 35.0* 35.0* 34.0* 37.6* 38.5*  MCV 85.2 84.5 85.2  --   --   --  84.9 85.0  PLT 245 249 266  --   --   --  289 334   Basic Metabolic Panel: Recent Labs  Lab 09/18/23 0237 09/19/23 0557 09/19/23 1657 09/20/23 0712 09/20/23 0836 09/20/23 0838 09/20/23 0840 09/21/23 0348 09/22/23 0510  NA 135 135 131* 136 138 139 141 135 136  K 3.2* 2.8* 3.2* 4.0 3.7 3.6 3.5 3.2* 3.5  CL 99  97* 94* 101  --   --   --  99 99  CO2 23 27 25 26   --   --   --  25 26  GLUCOSE 143* 106* 118* 107*  --   --   --  105* 103*  BUN 20 14 16 14   --   --   --  13 14  CREATININE 1.94* 1.61* 1.65* 1.50*  --   --   --  1.52* 1.50*  CALCIUM 8.5* 8.0* 8.5* 8.8*  --   --   --  8.7* 8.8*  MG 1.8 2.3  --  2.3  --   --   --  2.1 2.1   GFR: Estimated Creatinine Clearance: 89.7 mL/min (A) (by C-G formula based on SCr of 1.5 mg/dL (H)). Liver Function Tests: Recent Labs  Lab 09/16/23 2353 09/18/23 0237  AST 45* 87*  ALT 34 77*  ALKPHOS 47 45  BILITOT 5.1* 5.0*  PROT 7.5 7.5  ALBUMIN 3.5 3.4*   No results for  input(s): LIPASE, AMYLASE in the last 168 hours. No results for input(s): AMMONIA in the last 168 hours. Coagulation Profile: No results for input(s): INR, PROTIME in the last 168 hours. Cardiac Enzymes: No results for input(s): CKTOTAL, CKMB, CKMBINDEX, TROPONINI in the last 168 hours. BNP (last 3 results) No results for input(s): PROBNP in the last 8760 hours. HbA1C: Recent Labs    09/22/23 0500  HGBA1C 5.4   CBG: No results for input(s): GLUCAP in the last 168 hours. Lipid Profile: No results for input(s): CHOL, HDL, LDLCALC, TRIG, CHOLHDL, LDLDIRECT in the last 72 hours. Thyroid Function Tests: No results for input(s): TSH, T4TOTAL, FREET4, T3FREE, THYROIDAB in the last 72 hours. Anemia Panel: No results for input(s): VITAMINB12, FOLATE, FERRITIN, TIBC, IRON, RETICCTPCT in the last 72 hours. Urine analysis:    Component Value Date/Time   COLORURINE STRAW (A) 06/10/2023 2209   APPEARANCEUR CLEAR 06/10/2023 2209   LABSPEC 1.002 (L) 06/10/2023 2209   PHURINE 7.0 06/10/2023 2209   GLUCOSEU >=500 (A) 06/10/2023 2209   HGBUR NEGATIVE 06/10/2023 2209   BILIRUBINUR NEGATIVE 06/10/2023 2209   KETONESUR NEGATIVE 06/10/2023 2209   PROTEINUR NEGATIVE 06/10/2023 2209   UROBILINOGEN 0.2 04/09/2012 0651   NITRITE NEGATIVE 06/10/2023 2209   LEUKOCYTESUR NEGATIVE 06/10/2023 2209   Sepsis Labs: @LABRCNTIP (procalcitonin:4,lacticidven:4)  ) Recent Results (from the past 240 hours)  Resp panel by RT-PCR (RSV, Flu A&B, Covid) Anterior Nasal Swab     Status: None   Collection Time: 09/16/23 11:59 PM   Specimen: Anterior Nasal Swab  Result Value Ref Range Status   SARS Coronavirus 2 by RT PCR NEGATIVE NEGATIVE Final   Influenza A by PCR NEGATIVE NEGATIVE Final   Influenza B by PCR NEGATIVE NEGATIVE Final    Comment: (NOTE) The Xpert Xpress SARS-CoV-2/FLU/RSV plus assay is intended as an aid in the diagnosis of influenza from  Nasopharyngeal swab specimens and should not be used as a sole basis for treatment. Nasal washings and aspirates are unacceptable for Xpert Xpress SARS-CoV-2/FLU/RSV testing.  Fact Sheet for Patients: BloggerCourse.com  Fact Sheet for Healthcare Providers: SeriousBroker.it  This test is not yet approved or cleared by the [place]  FDA and has been authorized for detection and/or diagnosis of SARS-CoV-2 by FDA under an Emergency Use Authorization (EUA). This EUA will remain in effect (meaning this test can be used) for the duration of the COVID-19 declaration under Section 564(b)(1) of the Act, 21 U.S.C. section 360bbb-3(b)(1), unless the  authorization is terminated or revoked.     Resp Syncytial Virus by PCR NEGATIVE NEGATIVE Final    Comment: (NOTE) Fact Sheet for Patients: BloggerCourse.com  Fact Sheet for Healthcare Providers: SeriousBroker.it  This test is not yet approved or cleared by the [place]  FDA and has been authorized for detection and/or diagnosis of SARS-CoV-2 by FDA under an Emergency Use Authorization (EUA). This EUA will remain in effect (meaning this test can be used) for the duration of the COVID-19 declaration under Section 564(b)(1) of the Act, 21 U.S.C. section 360bbb-3(b)(1), unless the authorization is terminated or revoked.  Performed at [facility], [address] [place], [place] [address]      Radiology Studies: No results found.   Scheduled Meds:  apixaban   5 mg Oral BID   ascorbic acid   500 mg Oral Daily   Chlorhexidine  Gluconate Cloth  6 each Topical Daily   digoxin   0.0625 mg Oral Daily   empagliflozin   10 mg Oral Daily   losartan   12.5 mg Oral Daily   mexiletine  200 mg Oral BID   polyethylene glycol  17 g Oral Daily   sodium chloride  flush  10-40 mL Intracatheter Q12H   sodium chloride  flush  3 mL Intravenous Q12H    sodium chloride  flush  3 mL Intravenous Q12H   spironolactone   25 mg Oral Daily   Continuous Infusions:  milrinone  0.125 mcg/kg/min (09/22/23 2241)     LOS: 6 days    Time spent:    Sigurd Pac, MD [facility]   09/23/2023, 11:38 AM

## 2023-09-23 NOTE — Plan of Care (Signed)
   Problem: Education: Goal: Knowledge of General Education information will improve Description Including pain rating scale, medication(s)/side effects and non-pharmacologic comfort measures Outcome: Progressing

## 2023-09-23 NOTE — Progress Notes (Addendum)
 Advanced Heart Failure Rounding Note   Subjective:    RHC 09/08 on milrinone  0.25 RA = 6 RV = 50/10 PA = 45/28 (31) PCW = 30 Fick cardiac output/index = 5.5/2.3 PVR = 0.2 WU Ao sat = 95% PA sat = 57%, 61% PAPi = 2.8   9/10 - milrinone  wean started  CO-OX 57% on 0.125 milrinone  with Fick CI 1.7.  AM labs pending  CVP 5  Feeling well. No dyspnea.  Has questions about next steps.  Objective:    Vital Signs:   Temp:  [97.7 F (36.5 C)-98.7 F (37.1 C)] 98.5 F (36.9 C) (09/11 0715) Pulse Rate:  [100-111] 100 (09/11 0715) Resp:  [17-20] 17 (09/11 0715) BP: (97-104)/(66-86) 100/75 (09/11 0715) SpO2:  [97 %-100 %] 99 % (09/11 0715) Weight:  [121 kg] 121 kg (09/11 0309) Last BM Date : 09/22/23  Weight change: Filed Weights   09/21/23 0400 09/22/23 0316 09/23/23 0309  Weight: 119.1 kg 120 kg 121 kg    Intake/Output:   Intake/Output Summary (Last 24 hours) at 09/23/2023 0752 Last data filed at 09/23/2023 0736 Gross per 24 hour  Intake 473 ml  Output 1600 ml  Net -1127 ml     Physical Exam: General:  Well appearing. No resp difficulty Cor: No JVD. Regular rate & rhythm, tachy. No murmurs. Lungs: clear Abdomen: obese, soft, nontender, nondistended.  Extremities: no edema Neuro: alert & orientedx3. Affect pleasant     Telemetry:  ST 100s   Labs: Basic Metabolic Panel: Recent Labs  Lab 09/18/23 0237 09/19/23 0557 09/19/23 1657 09/20/23 0712 09/20/23 0836 09/20/23 0838 09/20/23 0840 09/21/23 0348 09/22/23 0510  NA 135 135 131* 136 138 139 141 135 136  K 3.2* 2.8* 3.2* 4.0 3.7 3.6 3.5 3.2* 3.5  CL 99 97* 94* 101  --   --   --  99 99  CO2 23 27 25 26   --   --   --  25 26  GLUCOSE 143* 106* 118* 107*  --   --   --  105* 103*  BUN 20 14 16 14   --   --   --  13 14  CREATININE 1.94* 1.61* 1.65* 1.50*  --   --   --  1.52* 1.50*  CALCIUM 8.5* 8.0* 8.5* 8.8*  --   --   --  8.7* 8.8*  MG 1.8 2.3  --  2.3  --   --   --  2.1 2.1    Liver Function  Tests: Recent Labs  Lab 09/16/23 2353 09/18/23 0237  AST 45* 87*  ALT 34 77*  ALKPHOS 47 45  BILITOT 5.1* 5.0*  PROT 7.5 7.5  ALBUMIN 3.5 3.4*   No results for input(s): LIPASE, AMYLASE in the last 168 hours. No results for input(s): AMMONIA in the last 168 hours.  CBC: Recent Labs  Lab 09/18/23 0237 09/19/23 0557 09/20/23 0712 09/20/23 0836 09/20/23 0838 09/20/23 0840 09/21/23 0348 09/22/23 0510  WBC 13.2* 12.2* 12.1*  --   --   --  10.2 9.7  HGB 11.8* 11.8* 11.9* 11.9* 11.9* 11.6* 12.6* 12.8*  HCT 35.2* 34.4* 35.8* 35.0* 35.0* 34.0* 37.6* 38.5*  MCV 85.2 84.5 85.2  --   --   --  84.9 85.0  PLT 245 249 266  --   --   --  289 334    Cardiac Enzymes: No results for input(s): CKTOTAL, CKMB, CKMBINDEX, TROPONINI in the last 168 hours.  BNP: BNP (  last 3 results) Recent Labs    06/21/23 1509 07/20/23 1154 09/16/23 2353  BNP 673.5* 925.3* 2,277.9*    ProBNP (last 3 results) No results for input(s): PROBNP in the last 8760 hours.    Other results:  Imaging: No results found.     Medications:     Scheduled Medications:  apixaban   5 mg Oral BID   ascorbic acid   500 mg Oral Daily   Chlorhexidine  Gluconate Cloth  6 each Topical Daily   digoxin   0.0625 mg Oral Daily   empagliflozin   10 mg Oral Daily   losartan   12.5 mg Oral Daily   mexiletine  200 mg Oral BID   polyethylene glycol  17 g Oral Daily   sodium chloride  flush  10-40 mL Intracatheter Q12H   sodium chloride  flush  3 mL Intravenous Q12H   sodium chloride  flush  3 mL Intravenous Q12H   spironolactone   25 mg Oral Daily    Infusions:  milrinone  0.125 mcg/kg/min (09/22/23 2241)    PRN Medications: acetaminophen , ondansetron  (ZOFRAN ) IV, sodium chloride  flush, sodium chloride  flush, sodium chloride  flush   Assessment:   1. A/C Biventricular HFrEF , NICM  - Initially diagnosed with HFrEF in 2023.  Left heart catheter time with no CAD.  [facility]cho [facility] July 2025 --LVEF 15% RV  severely reduced.  - July 2023 CPX VO2 12.7 (43%) 19 when adjusted  to ideal body weight. Slope 42 - Low output on admit w/ Coox 38%  - RHC 9/09 with elevated filling pressures and mild to moderately reduced CO (CI 2.3) on milrinone  0.25 - CO-OX marginal (57%) on 0.125 milrinone  with Fick CI 1.7. Will discuss further wean with Dr. Cherrie. - CVP 5. Not currently on diuretic. May need Torsemide  tomorrow. - No bb with suspected low output. - Continue digoxin  0.0625 - Continue  jardiance  10 mg daily  - Continue spiro 25 mg daily - Continue losartan  12.5 mg daily - No BP room to titrate GDMT - Long talk about need and candidacy for advanced therapies. Currently not a candidate for transplant with tobacco use. We discussed smoking cessation. Needs to avoid nicotine  6 months for consideration.  He was seen by VAD coordinators in June for possible VAD. RV dysfunction may preclude VAD. Discussed directly that he needs to be more engaged in his own care to be candidate for advanced therapies.  2. PVCs - Started on Mexiletine 200 mg BID on 09/09  - Suppressed today   3. AKI due to cardiorenal syndrome - Baseline creatinine ~ 1.3-1.4. On admit 1.9.  - AM labs pending   4. Severe Sleep Apnea - AHI 54 Desaturation 78%, CPAP was recommended.  - Outpatient Pulmonary working on CPAP.   5. Tobacco Abuse - Recently started smoking again.  - Discussed cessation as above   6. LV Thrombus  - He has been on eliquis  for possible LV thrombus noted 02/2023. - Continue Eliquis    7. Obesity - Body mass index is 36.18 kg/m. - seen by Nutitionist (thanks)  8. Hypokalemia - Continue spiro - AM labs  Length of Stay: 6   FINCH, LINDSAY N PA-C 09/23/2023, 7:52 AM  Advanced Heart Failure Team Pager [phone] (M-F; 7a - 4p)  Please contact [facility] for night-coverage after hours (4p -7a ) and weekends on [email or web address]   Patient seen and examined with the above-signed Advanced Practice Provider  and/or Housestaff. I personally reviewed laboratory data, imaging studies and relevant notes. I independently examined the patient and formulated  the important aspects of the plan. I have edited the note to reflect any of my changes or salient points. I have personally discussed the plan with the patient and/or family.  Remains on milrinone  01.25. Co-ox marginal at 56% CVP ok Denies CP or SOB  General:  Sitting up in bed No resp difficulty HEENT: normal Neck: supple. no JVD. Carotids 2+ bilat; no bruits. No lymphadenopathy or thryomegaly appreciated. Cor:. Regular rate & rhythm. No rubs, gallops or murmurs. Lungs: clear Abdomen: soft, nontender, nondistended. No hepatosplenomegaly. No bruits or masses. Good bowel sounds. Extremities: no cyanosis, clubbing, rash, edema Neuro: alert & orientedx3, cranial nerves grossly intact. moves all 4 extremities w/o difficulty. Affect pleasant  Will wean milrinone  to off. Follow coox and symptoms. If stable can continue watchful waiting. If co-ox dropping or more symptomatic will need to consider VAD. Hopefully PVC suppression will facilitate recovery.  Toribio Fuel, MD  12:13 PM

## 2023-09-24 DIAGNOSIS — I5023 Acute on chronic systolic (congestive) heart failure: Secondary | ICD-10-CM | POA: Diagnosis not present

## 2023-09-24 LAB — COOXEMETRY PANEL
Carboxyhemoglobin: 0.8 % (ref 0.5–1.5)
Carboxyhemoglobin: <0.3 % — ABNORMAL LOW (ref 0.5–1.5)
Methemoglobin: <0.7 % (ref 0.0–1.5)
Methemoglobin: <0.7 % (ref 0.0–1.5)
O2 Saturation: 41.6 %
O2 Saturation: 54.4 %
Total hemoglobin: 13 g/dL (ref 12.0–16.0)
Total hemoglobin: 12.4 g/dL (ref 12.0–16.0)

## 2023-09-24 LAB — MAGNESIUM: Magnesium: 2.3 mg/dL (ref 1.7–2.4)

## 2023-09-24 LAB — BASIC METABOLIC PANEL WITH GFR
Anion gap: 12 (ref 5–15)
BUN: 11 mg/dL (ref 6–20)
CO2: 23 mmol/L (ref 22–32)
Calcium: 8.9 mg/dL (ref 8.9–10.3)
Chloride: 100 mmol/L (ref 98–111)
Creatinine, Ser: 1.39 mg/dL — ABNORMAL HIGH (ref 0.61–1.24)
GFR, Estimated: >60 mL/min (ref >60)
Glucose, Bld: 96 mg/dL (ref 70–99)
Potassium: 4.1 mmol/L (ref 3.5–5.1)
Sodium: 135 mmol/L (ref 135–145)

## 2023-09-24 MED ORDER — TORSEMIDE 20 MG PO TABS
40.0000 mg | ORAL_TABLET | Freq: Every day | ORAL | Status: DC
  Administered 2023-09-24 – 2023-09-27 (×4): 40 mg via ORAL
  Filled 2023-09-24 (×4): qty 2

## 2023-09-24 MED ORDER — POTASSIUM CHLORIDE CRYS ER 20 MEQ PO TBCR
40.0000 meq | EXTENDED_RELEASE_TABLET | Freq: Once | ORAL | Status: AC
Start: 2023-09-24 — End: 2023-09-24
  Administered 2023-09-24: 40 meq via ORAL
  Filled 2023-09-24: qty 2

## 2023-09-24 NOTE — Progress Notes (Addendum)
 Advanced Heart Failure Rounding Note  HF Cardiologist: Dr. Cherrie  Subjective:    RHC 09/08 on milrinone  0.25 RA = 6 RV = 50/10 PA = 45/28 (31) PCW = 30 Fick cardiac output/index = 5.5/2.3 PVR = 0.2 WU Ao sat = 95% PA sat = 57%, 61% PAPi = 2.8   Milrinone  weaned off yesterday.   CO-OX 42% this am. Recheck pending.  CVP not hooked up.  No complaints. Denies dyspnea, orthopnea, abdominal bloating. Reports ambulating halls without difficulty.   Objective:    Vital Signs:   Temp:  [98.3 F (36.8 C)-98.6 F (37 C)] 98.3 F (36.8 C) (09/12 0334) Pulse Rate:  [90-110] 104 (09/12 0334) Resp:  [14-21] 18 (09/12 0334) BP: (99-103)/(69-82) 101/77 (09/12 0334) SpO2:  [98 %-100 %] 98 % (09/12 0334) Weight:  [119.1 kg] 119.1 kg (09/12 0334) Last BM Date : 09/22/23  Weight change: Filed Weights   09/22/23 0316 09/23/23 0309 09/24/23 0334  Weight: 120 kg 121 kg 119.1 kg    Intake/Output:   Intake/Output Summary (Last 24 hours) at 09/24/2023 9297 Last data filed at 09/24/2023 0400 Gross per 24 hour  Intake 603 ml  Output 940 ml  Net -337 ml     Physical Exam: General:  Lying comfortably in bed. Cor: JVP ~ 8 cm. Regular rhythm, tachy. No murmur Lungs: breathing nonlabored. Abdomen:  obese, soft, nondistended Extremities: no edema Neuro: Alert and oriented X 3. Affect pleasant.   Telemetry: ST 100s, ~ 5 PVCs/min   Labs: Basic Metabolic Panel: Recent Labs  Lab 09/20/23 0712 09/20/23 0836 09/20/23 0840 09/21/23 0348 09/22/23 0510 09/23/23 0500 09/24/23 0440  NA 136   < > 141 135 136 133* 135  K 4.0   < > 3.5 3.2* 3.5 4.0 4.1  CL 101  --   --  99 99 99 100  CO2 26  --   --  25 26 25 23   GLUCOSE 107*  --   --  105* 103* 136* 96  BUN 14  --   --  13 14 11 11   CREATININE 1.50*  --   --  1.52* 1.50* 1.45* 1.39*  CALCIUM 8.8*  --   --  8.7* 8.8* 8.9 8.9  MG 2.3  --   --  2.1 2.1 2.2 2.3   < > = values in this interval not displayed.    Liver  Function Tests: Recent Labs  Lab 09/18/23 0237  AST 87*  ALT 77*  ALKPHOS 45  BILITOT 5.0*  PROT 7.5  ALBUMIN 3.4*   No results for input(s): LIPASE, AMYLASE in the last 168 hours. No results for input(s): AMMONIA in the last 168 hours.  CBC: Recent Labs  Lab 09/18/23 0237 09/19/23 0557 09/20/23 0712 09/20/23 0836 09/20/23 0838 09/20/23 0840 09/21/23 0348 09/22/23 0510  WBC 13.2* 12.2* 12.1*  --   --   --  10.2 9.7  HGB 11.8* 11.8* 11.9* 11.9* 11.9* 11.6* 12.6* 12.8*  HCT 35.2* 34.4* 35.8* 35.0* 35.0* 34.0* 37.6* 38.5*  MCV 85.2 84.5 85.2  --   --   --  84.9 85.0  PLT 245 249 266  --   --   --  289 334    Cardiac Enzymes: No results for input(s): CKTOTAL, CKMB, CKMBINDEX, TROPONINI in the last 168 hours.  BNP: BNP (last 3 results) Recent Labs    06/21/23 1509 07/20/23 1154 09/16/23 2353  BNP 673.5* 925.3* 2,277.9*    ProBNP (last 3 results)  No results for input(s): PROBNP in the last 8760 hours.    Other results:  Imaging: No results found.     Medications:     Scheduled Medications:  apixaban   5 mg Oral BID   ascorbic acid   500 mg Oral Daily   Chlorhexidine  Gluconate Cloth  6 each Topical Daily   digoxin   0.0625 mg Oral Daily   empagliflozin   10 mg Oral Daily   losartan   12.5 mg Oral Daily   mexiletine  200 mg Oral BID   polyethylene glycol  17 g Oral Daily   sodium chloride  flush  10-40 mL Intracatheter Q12H   sodium chloride  flush  3 mL Intravenous Q12H   sodium chloride  flush  3 mL Intravenous Q12H   spironolactone   25 mg Oral Daily    Infusions:    PRN Medications: acetaminophen , ondansetron  (ZOFRAN ) IV, sodium chloride  flush, sodium chloride  flush, sodium chloride  flush   Assessment:   1. A/C Biventricular HFrEF , NICM  - Initially diagnosed with HFrEF in 2023.  Left heart catheter time with no CAD.  Echo [facility] July 2025 --LVEF 15% RV severely reduced.  - July 2023 CPX VO2 12.7 (43%) 19 when adjusted  to ideal  body weight. Slope 42 - Low output on admit w/ Coox 38%  - RHC 9/09 with elevated filling pressures and mild to moderately reduced CO (CI 2.3) on milrinone  0.25 - Weaned off milrinone  9/11. CO-OX 42% this am. Recheck pending. He is warm on exam. States he feels well. - Hook up CVP. Volume elevated on exam. Start 40 mg Torsemide  daily - No bb with suspected low output. - Continue digoxin  0.0625 - Continue  jardiance  10 mg daily  - Continue spiro 25 mg daily - Continue losartan  12.5 mg daily - No BP room to titrate GDMT - Long talk about need and candidacy for advanced therapies. Currently not a candidate for transplant with tobacco use. We discussed smoking cessation. Needs to avoid nicotine  6 months for consideration.  He was seen by VAD coordinators in June for possible VAD. RV dysfunction may preclude VAD. Discussed directly that he needs to be more engaged in his own care to be candidate for advanced therapies.  2. PVCs - Started on Mexiletine 200 mg BID on 09/09  - ~ 5% burden on telemetry today - Keep K > 4 and Mag <2    3. AKI due to cardiorenal syndrome - Baseline creatinine ~ 1.3-1.4. On admit 1.9. Now resolved.   4. Severe Sleep Apnea - AHI 54 Desaturation 78%, CPAP was recommended.  - Outpatient Pulmonary working on CPAP.   5. Tobacco Abuse - Recently started smoking again.  - Discussed cessation as above   6. LV Thrombus  - He has been on eliquis  for possible LV thrombus noted 02/2023. - Continue Eliquis    7. Obesity - Body mass index is 35.61 kg/m. - seen by Nutitionist (thanks)  8. Hypokalemia - Continue spiro - K 4.1 today - Supp with diuresis  Length of Stay: 7   FINCH, LINDSAY N PA-C 09/24/2023, 7:02 AM  Advanced Heart Failure Team Pager [phone] (M-F; 7a - 4p)  Please contact [facility] for night-coverage after hours (4p -7a ) and weekends on [email or web address]    Patient seen and examined with the above-signed Advanced Practice Provider and/or  Housestaff. I personally reviewed laboratory data, imaging studies and relevant notes. I independently examined the patient and formulated the important aspects of the plan. I have edited the note to  reflect any of my changes or salient points. I have personally discussed the plan with the patient and/or family.  Feels ok but co-ox dropping of milrinone  initially 42% today then repeat 54%  General:  Lying in bed . No resp difficulty HEENT: normal Neck: supple. no JVD. Carotids 2+ bilat; no bruits. No lymphadenopathy or thryomegaly appreciated. Rnm:Mzhlojm tachy Lungs: clear Abdomen: obese soft, nontender, nondistended. No hepatosplenomegaly. No bruits or masses. Good bowel sounds. Extremities: no cyanosis, clubbing, rash, edema Neuro: alert & orientedx3, cranial nerves grossly intact. moves all 4 extremities w/o difficulty. Affect pleasant  Co-ox marginal off milrinone . Will watch for one more day, If co-ox remains > 50% then would continue medical therapy for now and hope EF may improve with PVC suppression and GDMT. If co-ox < 50% will need to consider bridge VAD if he is a candidate. I worry if we wait too long CKD may progress as well.   Will have VAD team see for initial education and evalaution.  Toribio Fuel, MD  2:20 PM

## 2023-09-24 NOTE — TOC Progression Note (Signed)
 Transition of Care [facility]) - Progression Note    Patient Details  Name: Robert Daniels MRN: [id number] Date of Birth: December 07, 1985  [facility] CM/SW Contact  Justina Delcia Czar, RN Phone Number: [phone] 09/24/2023, 1:29 PM  Clinical Narrative:     Spoke to pt and states he completed sleep study but was not informed he would need CPAP. Explained [employer] DME rep, Vallie will review and send orders to PCP, and submit for insurance auth.   [employer] will follow up with patient to arrange delivery once auth complete, 1-2 weeks.     Expected Discharge Plan: Home/Self Care Barriers to Discharge: Continued Medical Work up    Expected Discharge Plan and Services   Discharge Planning Services: CM Consult   Living arrangements for the past 2 months: Single Family Home                 DME Arranged: Continuous positive airway pressure (CPAP) DME Agency: [facility]    Social Drivers of Health (SDOH) Interventions SDOH Screenings   Food Insecurity: No Food Insecurity (09/17/2023)  Housing: Low Risk  (09/17/2023)  Transportation Needs: No Transportation Needs (09/17/2023)  Utilities: Not At Risk (09/17/2023)  Alcohol Screen: Low Risk  (06/21/2023)  Depression (PHQ2-9): Low Risk  (06/21/2023)  Financial Resource Strain: Low Risk  (08/02/2023)   Received from [facility]  Recent Concern: Financial Resource Strain - Medium Risk (07/23/2023)  Physical Activity: Insufficiently Active (06/21/2023)  Social Connections: Unknown (06/10/2023)  Stress: No Stress Concern Present (06/21/2023)  Tobacco Use: Medium Risk (09/16/2023)  Health Literacy: Adequate Health Literacy (07/23/2023)    Readmission Risk Interventions     No data to display

## 2023-09-24 NOTE — Progress Notes (Signed)
 PROGRESS NOTE    Robert Daniels  FMW:[id number] DOB: 28-Dec-1985 DOA: 09/16/2023 PCP: Lorren Greig PARAS, NP  38/M with history of chronic systolic CHF, NICM, LV thrombus, CKD 3, substance abuse and obesity admitted with low output CHF.  Creatinine 1.8 on admission, BNP 2277, troponin 53, 58, lactate 2.8, chest x-ray with CHF - Initial Co. ox was low at 38, started on milrinone  - Improving with diuresis - 9/11, milrinone  discontinued - 9/12, Co. ox 47   Subjective: - Feels well, denies any complaints  Assessment and Plan:  Acute on chronic biventricular failure, low output - Known NICM -Admitted with low output CHF, Co. ox low of 38 initially, improving with diuresis, milrinone  -17.5 L negative, appears euvolemic now -RHC 9/9 with elevated filling pressures, moderately reduced cardiac output on milrinone  - Starting torsemide  today, continue digoxin , Jardiance , Aldactone , losartan  -eats fast food and ultra processed food every day, counseled, dietitian consult appreciated -Co. ox down to 47 this morning off milrinone , repeat is 54> LVAD workup initiated   AKI on CKD 2 - Cardiorenal, improving, close to baseline now  Hyperglycemia A1c is 5.4  Known severe OSA -Followed by pulmonary, CPAP was recommended.  - TOC working on home CPAP   Tobacco Abuse - Counseled   History of LV Thrombus  - Continue Eliquis    Hypokalemia Repleted  History of substance use disorder Toxicology screen negative this time  Obesity, class 2 Calculated BMI is 38,4   DVT prophylaxis: Eliquis  Code Status: Full code Family Communication: None present Disposition Plan:   Consultants:    Procedures:   Antimicrobials:    Objective: Vitals:   09/23/23 2357 09/24/23 0334 09/24/23 0718 09/24/23 1130  BP:  101/77 96/72 98/79   Pulse: 97 (!) 104    Resp: 20 18 20    Temp:  98.3 F (36.8 C) 98.3 F (36.8 C) 97.6 F (36.4 C)  TempSrc:  Oral Oral Oral  SpO2:  98% 99%   Weight:  119.1 kg     Height:        Intake/Output Summary (Last 24 hours) at 09/24/2023 1150 Last data filed at 09/24/2023 0400 Gross per 24 hour  Intake 363 ml  Output 740 ml  Net -377 ml   Filed Weights   09/22/23 0316 09/23/23 0309 09/24/23 0334  Weight: 120 kg 121 kg 119.1 kg    Examination:  General exam: AAO x 3, no distress HEENT: No JVD Respiratory system: Clear to auscultation Cardiovascular system: S1 & S2 heard, RRR.  Abd: nondistended, soft and nontender.Normal bowel sounds heard. Central nervous system: Alert and oriented. No focal neurological deficits. Extremities: No edema, PICC line Skin: No rashes Psychiatry:  Mood & affect appropriate.     Data Reviewed:   CBC: Recent Labs  Lab 09/18/23 0237 09/19/23 0557 09/20/23 9287 09/20/23 0836 09/20/23 0838 09/20/23 0840 09/21/23 0348 09/22/23 0510  WBC 13.2* 12.2* 12.1*  --   --   --  10.2 9.7  HGB 11.8* 11.8* 11.9* 11.9* 11.9* 11.6* 12.6* 12.8*  HCT 35.2* 34.4* 35.8* 35.0* 35.0* 34.0* 37.6* 38.5*  MCV 85.2 84.5 85.2  --   --   --  84.9 85.0  PLT 245 249 266  --   --   --  289 334   Basic Metabolic Panel: Recent Labs  Lab 09/20/23 0712 09/20/23 0836 09/20/23 0840 09/21/23 0348 09/22/23 0510 09/23/23 0500 09/24/23 0440  NA 136   < > 141 135 136 133* 135  K 4.0   < >  3.5 3.2* 3.5 4.0 4.1  CL 101  --   --  99 99 99 100  CO2 26  --   --  25 26 25 23   GLUCOSE 107*  --   --  105* 103* 136* 96  BUN 14  --   --  13 14 11 11   CREATININE 1.50*  --   --  1.52* 1.50* 1.45* 1.39*  CALCIUM 8.8*  --   --  8.7* 8.8* 8.9 8.9  MG 2.3  --   --  2.1 2.1 2.2 2.3   < > = values in this interval not displayed.   GFR: Estimated Creatinine Clearance: 96 mL/min (A) (by C-G formula based on SCr of 1.39 mg/dL (H)). Liver Function Tests: Recent Labs  Lab 09/18/23 0237  AST 87*  ALT 77*  ALKPHOS 45  BILITOT 5.0*  PROT 7.5  ALBUMIN 3.4*   No results for input(s): LIPASE, AMYLASE in the last 168 hours. No results for  input(s): AMMONIA in the last 168 hours. Coagulation Profile: No results for input(s): INR, PROTIME in the last 168 hours. Cardiac Enzymes: No results for input(s): CKTOTAL, CKMB, CKMBINDEX, TROPONINI in the last 168 hours. BNP (last 3 results) No results for input(s): PROBNP in the last 8760 hours. HbA1C: Recent Labs    09/22/23 0500  HGBA1C 5.4   CBG: No results for input(s): GLUCAP in the last 168 hours. Lipid Profile: No results for input(s): CHOL, HDL, LDLCALC, TRIG, CHOLHDL, LDLDIRECT in the last 72 hours. Thyroid Function Tests: No results for input(s): TSH, T4TOTAL, FREET4, T3FREE, THYROIDAB in the last 72 hours. Anemia Panel: No results for input(s): VITAMINB12, FOLATE, FERRITIN, TIBC, IRON, RETICCTPCT in the last 72 hours. Urine analysis:    Component Value Date/Time   COLORURINE STRAW (A) 06/10/2023 2209   APPEARANCEUR CLEAR 06/10/2023 2209   LABSPEC 1.002 (L) 06/10/2023 2209   PHURINE 7.0 06/10/2023 2209   GLUCOSEU >=500 (A) 06/10/2023 2209   HGBUR NEGATIVE 06/10/2023 2209   BILIRUBINUR NEGATIVE 06/10/2023 2209   KETONESUR NEGATIVE 06/10/2023 2209   PROTEINUR NEGATIVE 06/10/2023 2209   UROBILINOGEN 0.2 04/09/2012 0651   NITRITE NEGATIVE 06/10/2023 2209   LEUKOCYTESUR NEGATIVE 06/10/2023 2209   Sepsis Labs: @LABRCNTIP (procalcitonin:4,lacticidven:4)  ) Recent Results (from the past 240 hours)  Resp panel by RT-PCR (RSV, Flu A&B, Covid) Anterior Nasal Swab     Status: None   Collection Time: 09/16/23 11:59 PM   Specimen: Anterior Nasal Swab  Result Value Ref Range Status   SARS Coronavirus 2 by RT PCR NEGATIVE NEGATIVE Final   Influenza A by PCR NEGATIVE NEGATIVE Final   Influenza B by PCR NEGATIVE NEGATIVE Final    Comment: (NOTE) The Xpert Xpress SARS-CoV-2/FLU/RSV plus assay is intended as an aid in the diagnosis of influenza from Nasopharyngeal swab specimens and should not be used as a sole basis for  treatment. Nasal washings and aspirates are unacceptable for Xpert Xpress SARS-CoV-2/FLU/RSV testing.  Fact Sheet for Patients: BloggerCourse.com  Fact Sheet for Healthcare Providers: SeriousBroker.it  This test is not yet approved or cleared by the [place]  FDA and has been authorized for detection and/or diagnosis of SARS-CoV-2 by FDA under an Emergency Use Authorization (EUA). This EUA will remain in effect (meaning this test can be used) for the duration of the COVID-19 declaration under Section 564(b)(1) of the Act, 21 U.S.C. section 360bbb-3(b)(1), unless the authorization is terminated or revoked.     Resp Syncytial Virus by PCR NEGATIVE NEGATIVE Final  Comment: (NOTE) Fact Sheet for Patients: BloggerCourse.com  Fact Sheet for Healthcare Providers: SeriousBroker.it  This test is not yet approved or cleared by the [place]  FDA and has been authorized for detection and/or diagnosis of SARS-CoV-2 by FDA under an Emergency Use Authorization (EUA). This EUA will remain in effect (meaning this test can be used) for the duration of the COVID-19 declaration under Section 564(b)(1) of the Act, 21 U.S.C. section 360bbb-3(b)(1), unless the authorization is terminated or revoked.  Performed at [facility], [address] [place], [place] [address]      Radiology Studies: No results found.   Scheduled Meds:  apixaban   5 mg Oral BID   ascorbic acid   500 mg Oral Daily   Chlorhexidine  Gluconate Cloth  6 each Topical Daily   digoxin   0.0625 mg Oral Daily   empagliflozin   10 mg Oral Daily   losartan   12.5 mg Oral Daily   mexiletine  200 mg Oral BID   polyethylene glycol  17 g Oral Daily   sodium chloride  flush  10-40 mL Intracatheter Q12H   sodium chloride  flush  3 mL Intravenous Q12H   sodium chloride  flush  3 mL Intravenous Q12H   spironolactone   25 mg  Oral Daily   torsemide   40 mg Oral Daily   Continuous Infusions:     LOS: 7 days    Time spent:    Sigurd Pac, MD [facility]   09/24/2023, 11:50 AM

## 2023-09-24 NOTE — Progress Notes (Signed)
 [facility] EDUCATION NOTE:                VAD evaluation consent reviewed and signed by pt Robert Daniels. Initial VAD teaching completed with pt and caregiver.   VAD educational packet including Understanding Your Options with Advanced Heart Failure, [place] Patient Agreement for VAD Evaluation and Potential Implantation consent, and Abbott Heartmate 3 Left Ventricular Device (LVAD) Patient Guide, Heartmate 3 Left Ventricular Assist System Patient Education Program DVD, [place] HM III Patient Education, [place] Mechanical Circulatory Support Program, and Decision Aids for Left Ventricular Assist Device reviewed in detail and left at bedside for continued reference.   All questions answered regarding VAD implant, hospital stay, and what to expect when discharged home living with a heart pump. Pt identified Robert Daniels as his primary caregiver if this therapy should be deemed appropriate.  Explained need for 24/7 care when pt is discharged home due to sternal precautions, adaptation to living on support, emotional support, consistent and meticulous exit site care and management, medication adherence and high volume of follow up visits with the VAD Clinic after discharge; both pt and caregiver verbalized understanding of above.   Explained that LVAD can be implanted for two indications in the setting of advanced left ventricular heart failure treatment:  Bridge to transplant - used for patients who cannot safely wait for heart transplant without this device.  Or    Destination therapy - used for patients until end of life or recovery of heart function.  Patient and caregiver(s) acknowledge that the indication at this point in time for LVAD therapy would be for destination therapy due to smoking.   Provided brief equipment overview and demonstration with HeartMate III training loop including discussion on the following:   a) mobile power unit b) system controller   c) universal  Magazine features editor   d) battery clips   e) Batteries   f)  Perc lock   g) Percutaneous lead   Demonstrated and discussed:  a) changing power source on system controller from tethered (MPU) to untethered (battery) mode   b) changing power source on system controller from untethered (battery) to tethered (MPU) mode   c) how to monitor battery life both on the system controller and on each individual battery   d) changing batteries   Reviewed and supplied a copy of home inspection check list stressing that only three pronged grounded power outlets can be used for VAD equipment. Pt confirmed home has electrical outlets that will support the equipment along with access working telephone.  Identified the following lifestyle modifications while living on MCS:    1. No driving for at least three months and then only if doctor gives permission to do so.   2. No tub baths while pump implanted, and shower only when doctor gives permission.   3. No swimming or submersion in water while implanted with pump.   4. No contact sports or engaging in jumping activities.   5. Always have a backup controller, charged spare batteries, and battery clips nearby at all times in case of emergency.   6. Call the doctor or hospital contact person if any change in how the pump sounds, feels, or works.   7. Plan to sleep only when connected to the power module.   8. Do not sleep on your stomach.   9. Keep a backup system controller, charged batteries, battery clips, and flashlight near you during sleep in case of electrical power outage.  10. Exit site care including dressing changes, monitoring for infection, and importance of keeping percutaneous lead stabilized at all times.     Extended the option to have one of our current patients and caregiver(s) come to talk with them about living on support to assist with decision making.   Reviewed pictures of VAD drive line, site care, dressing changes, and drive line  stabilization including securement attachment device and abdominal binder. Discussed with pt and family that they will be required to purchase dressing supplies as long as patient has the VAD in place.   Reinforced need for 24 hour/7 day week caregivers; pt designated Robert Daniels as caregiver. He will also need to abide by sternal precautions with no lifting >10lbs, pushing, pulling and will need assistance with adapting to new life style with VAD equipment and care.   Intermacs patient survival statistics through March 2025 reviewed with patient and caregiver as follows:    The patient understands that from this discussion it does not mean that they will receive the device, but that depends on an extensive evaluation process. The patient is aware of the fact that if at anytime they want to stop the evaluation process they can.  All questions have been answered at this time and contact information was provided should they encounter any further questions.  They are both agreeable at this time to the evaluation process and will move forward.    Schuyler Lunger RN, BSN VAD Coordinator 24/7 Pager [phone]

## 2023-09-25 ENCOUNTER — Inpatient Hospital Stay (HOSPITAL_COMMUNITY)

## 2023-09-25 DIAGNOSIS — I5082 Biventricular heart failure: Secondary | ICD-10-CM

## 2023-09-25 DIAGNOSIS — E66812 Obesity, class 2: Secondary | ICD-10-CM

## 2023-09-25 DIAGNOSIS — Z01810 Encounter for preprocedural cardiovascular examination: Secondary | ICD-10-CM

## 2023-09-25 DIAGNOSIS — I428 Other cardiomyopathies: Secondary | ICD-10-CM

## 2023-09-25 DIAGNOSIS — I513 Intracardiac thrombosis, not elsewhere classified: Secondary | ICD-10-CM

## 2023-09-25 DIAGNOSIS — N289 Disorder of kidney and ureter, unspecified: Secondary | ICD-10-CM

## 2023-09-25 DIAGNOSIS — I493 Ventricular premature depolarization: Secondary | ICD-10-CM

## 2023-09-25 DIAGNOSIS — G4730 Sleep apnea, unspecified: Secondary | ICD-10-CM

## 2023-09-25 DIAGNOSIS — I5023 Acute on chronic systolic (congestive) heart failure: Secondary | ICD-10-CM | POA: Diagnosis not present

## 2023-09-25 DIAGNOSIS — F17210 Nicotine dependence, cigarettes, uncomplicated: Secondary | ICD-10-CM

## 2023-09-25 LAB — TYPE AND SCREEN
ABO/RH(D): O POS
Antibody Screen: NEGATIVE

## 2023-09-25 LAB — LIPID PANEL
Cholesterol: 79 mg/dL (ref 0–200)
HDL: 23 mg/dL — ABNORMAL LOW (ref >40)
LDL Cholesterol: 40 mg/dL (ref 0–99)
Total CHOL/HDL Ratio: 3.4 ratio
Triglycerides: 80 mg/dL (ref <150)
VLDL: 16 mg/dL (ref 0–40)

## 2023-09-25 LAB — BASIC METABOLIC PANEL WITH GFR
Anion gap: 11 (ref 5–15)
BUN: 13 mg/dL (ref 6–20)
CO2: 23 mmol/L (ref 22–32)
Calcium: 8.9 mg/dL (ref 8.9–10.3)
Chloride: 101 mmol/L (ref 98–111)
Creatinine, Ser: 1.53 mg/dL — ABNORMAL HIGH (ref 0.61–1.24)
GFR, Estimated: 59 mL/min — ABNORMAL LOW (ref >60)
Glucose, Bld: 86 mg/dL (ref 70–99)
Potassium: 4.3 mmol/L (ref 3.5–5.1)
Sodium: 135 mmol/L (ref 135–145)

## 2023-09-25 LAB — COOXEMETRY PANEL
Carboxyhemoglobin: 1.5 % (ref 0.5–1.5)
Methemoglobin: <0.7 % (ref 0.0–1.5)
O2 Saturation: 48.4 %
Total hemoglobin: 11.7 g/dL — ABNORMAL LOW (ref 12.0–16.0)

## 2023-09-25 LAB — VAS US DOPPLER PRE VAD
Left ABI: 1.11
Right ABI: 0.94

## 2023-09-25 LAB — HEPATITIS B SURFACE ANTIGEN: Hepatitis B Surface Ag: NONREACTIVE

## 2023-09-25 LAB — HEPATIC FUNCTION PANEL
ALT: 47 U/L — ABNORMAL HIGH (ref 0–44)
AST: 34 U/L (ref 15–41)
Albumin: 3 g/dL — ABNORMAL LOW (ref 3.5–5.0)
Alkaline Phosphatase: 58 U/L (ref 38–126)
Bilirubin, Direct: 0.4 mg/dL — ABNORMAL HIGH (ref 0.0–0.2)
Indirect Bilirubin: 1.2 mg/dL — ABNORMAL HIGH (ref 0.3–0.9)
Total Bilirubin: 1.6 mg/dL — ABNORMAL HIGH (ref 0.0–1.2)
Total Protein: 7.2 g/dL (ref 6.5–8.1)

## 2023-09-25 LAB — HEPATITIS C ANTIBODY: HCV Ab: NONREACTIVE

## 2023-09-25 LAB — APTT: aPTT: 40 s — ABNORMAL HIGH (ref 24–36)

## 2023-09-25 LAB — URIC ACID: Uric Acid, Serum: 8.5 mg/dL (ref 3.7–8.6)

## 2023-09-25 LAB — ANTITHROMBIN III: AntiThromb III Func: 92 % (ref 75–120)

## 2023-09-25 LAB — LACTATE DEHYDROGENASE: LDH: 219 U/L — ABNORMAL HIGH (ref 98–192)

## 2023-09-25 LAB — MAGNESIUM: Magnesium: 2.2 mg/dL (ref 1.7–2.4)

## 2023-09-25 LAB — PREALBUMIN: Prealbumin: 17 mg/dL — ABNORMAL LOW (ref 18–38)
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
## 2023-09-25 NOTE — Progress Notes (Signed)
 Pre-VAD testing has been completed. Preliminary results can be found in CV Proc through chart review.   09/25/23 12:03 PM Cathlyn Collet RVT

## 2023-09-26 LAB — BASIC METABOLIC PANEL WITH GFR
Anion gap: 11 (ref 5–15)
BUN: 14 mg/dL (ref 6–20)
CO2: 24 mmol/L (ref 22–32)
Calcium: 8.8 mg/dL — ABNORMAL LOW (ref 8.9–10.3)
Chloride: 101 mmol/L (ref 98–111)
Creatinine, Ser: 1.53 mg/dL — ABNORMAL HIGH (ref 0.61–1.24)
GFR, Estimated: 59 mL/min — ABNORMAL LOW (ref >60)
Glucose, Bld: 92 mg/dL (ref 70–99)
Potassium: 4 mmol/L (ref 3.5–5.1)
Sodium: 136 mmol/L (ref 135–145)

## 2023-09-26 LAB — HEPATIC FUNCTION PANEL
ALT: 45 U/L — ABNORMAL HIGH (ref 0–44)
AST: 34 U/L (ref 15–41)
Albumin: 2.9 g/dL — ABNORMAL LOW (ref 3.5–5.0)
Alkaline Phosphatase: 64 U/L (ref 38–126)
Bilirubin, Direct: <0.1 mg/dL (ref 0.0–0.2)
Total Bilirubin: 0.9 mg/dL (ref 0.0–1.2)
Total Protein: 7.5 g/dL (ref 6.5–8.1)

## 2023-09-26 LAB — COOXEMETRY PANEL
Carboxyhemoglobin: 1 % (ref 0.5–1.5)
Methemoglobin: 0.7 % (ref 0.0–1.5)
O2 Saturation: 47.9 %
Total hemoglobin: 12.2 g/dL (ref 12.0–16.0)

## 2023-09-26 LAB — MAGNESIUM: Magnesium: 2.3 mg/dL (ref 1.7–2.4)

## 2023-09-26 LAB — BRAIN NATRIURETIC PEPTIDE: B Natriuretic Peptide: 852.7 pg/mL — ABNORMAL HIGH (ref 0.0–100.0)

## 2023-09-26 NOTE — Progress Notes (Signed)
 Pt requesting that Attending MD provide a note for missing his court date on Monday 9/15

## 2023-09-26 NOTE — Plan of Care (Signed)
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
## 2023-09-26 NOTE — Progress Notes (Signed)
 Patient seen and examined, no changes from my note yesterday, co-ox remains low at 47.9, clinically stable, BP, warm lower extremities etc, creatinine plateaued at 1.5. - Cards following, heart failure team to see on Monday, LVAD workup ongoing  Sigurd Pac, MD

## 2023-09-27 ENCOUNTER — Other Ambulatory Visit (HOSPITAL_COMMUNITY): Payer: Self-pay

## 2023-09-27 ENCOUNTER — Inpatient Hospital Stay (HOSPITAL_COMMUNITY)

## 2023-09-27 DIAGNOSIS — F17210 Nicotine dependence, cigarettes, uncomplicated: Secondary | ICD-10-CM

## 2023-09-27 DIAGNOSIS — I5023 Acute on chronic systolic (congestive) heart failure: Secondary | ICD-10-CM

## 2023-09-27 DIAGNOSIS — Z515 Encounter for palliative care: Secondary | ICD-10-CM

## 2023-09-27 DIAGNOSIS — Z87891 Personal history of nicotine dependence: Secondary | ICD-10-CM

## 2023-09-27 DIAGNOSIS — I513 Intracardiac thrombosis, not elsewhere classified: Secondary | ICD-10-CM

## 2023-09-27 DIAGNOSIS — I428 Other cardiomyopathies: Secondary | ICD-10-CM

## 2023-09-27 DIAGNOSIS — G4730 Sleep apnea, unspecified: Secondary | ICD-10-CM

## 2023-09-27 DIAGNOSIS — Z7189 Other specified counseling: Secondary | ICD-10-CM

## 2023-09-27 DIAGNOSIS — I5082 Biventricular heart failure: Secondary | ICD-10-CM

## 2023-09-27 DIAGNOSIS — I071 Rheumatic tricuspid insufficiency: Secondary | ICD-10-CM

## 2023-09-27 DIAGNOSIS — J984 Other disorders of lung: Secondary | ICD-10-CM

## 2023-09-27 DIAGNOSIS — I493 Ventricular premature depolarization: Secondary | ICD-10-CM

## 2023-09-27 LAB — PULMONARY FUNCTION TEST
DL/VA % pred: 95 %
DL/VA: 4.44 ml/min/mmHg/L
DLCO cor % pred: 64 %
DLCO cor: 21.43 ml/min/mmHg
DLCO unc % pred: 60 %
DLCO unc: 20.26 ml/min/mmHg
FEF 25-75 Post: 4.03 L/s
FEF 25-75 Pre: 3.11 L/s
FEF2575-%Change-Post: 29 %
FEF2575-%Pred-Post: 94 %
FEF2575-%Pred-Pre: 73 %
FEV1-%Change-Post: 6 %
FEV1-%Pred-Post: 63 %
FEV1-%Pred-Pre: 59 %
FEV1-Post: 2.87 L
FEV1-Pre: 2.69 L
FEV1FVC-%Change-Post: 0 %
FEV1FVC-%Pred-Pre: 105 %
FEV6-%Change-Post: 6 %
FEV6-%Pred-Post: 61 %
FEV6-%Pred-Pre: 57 %
FEV6-Post: 3.39 L
FEV6-Pre: 3.17 L
FEV6FVC-%Change-Post: 0 %
FEV6FVC-%Pred-Post: 102 %
FEV6FVC-%Pred-Pre: 102 %
FVC-%Change-Post: 7 %
FVC-%Pred-Post: 59 %
FVC-%Pred-Pre: 55 %
FVC-Post: 3.4 L
FVC-Pre: 3.17 L
Post FEV1/FVC ratio: 84 %
Post FEV6/FVC ratio: 100 %
Pre FEV1/FVC ratio: 85 %
Pre FEV6/FVC Ratio: 100 %
RV % pred: 76 %
RV: 1.46 L
TLC % pred: 67 %
TLC: 4.97 L

## 2023-09-27 LAB — BASIC METABOLIC PANEL WITH GFR
Anion gap: 9 (ref 5–15)
BUN: 14 mg/dL (ref 6–20)
CO2: 24 mmol/L (ref 22–32)
Calcium: 8.6 mg/dL — ABNORMAL LOW (ref 8.9–10.3)
Chloride: 103 mmol/L (ref 98–111)
Creatinine, Ser: 1.53 mg/dL — ABNORMAL HIGH (ref 0.61–1.24)
GFR, Estimated: 59 mL/min — ABNORMAL LOW (ref >60)
Glucose, Bld: 86 mg/dL (ref 70–99)
Potassium: 3.8 mmol/L (ref 3.5–5.1)
Sodium: 136 mmol/L (ref 135–145)

## 2023-09-27 LAB — LUPUS ANTICOAGULANT PANEL
DRVVT: 69.5 s — ABNORMAL HIGH (ref 0.0–47.0)
PTT Lupus Anticoagulant: 45.4 s — ABNORMAL HIGH (ref 0.0–43.5)

## 2023-09-27 LAB — COOXEMETRY PANEL
Carboxyhemoglobin: 1.2 % (ref 0.5–1.5)
Methemoglobin: <0.7 % (ref 0.0–1.5)
O2 Saturation: 56.5 %
Total hemoglobin: 12.1 g/dL (ref 12.0–16.0)

## 2023-09-27 LAB — MAGNESIUM: Magnesium: 2.1 mg/dL (ref 1.7–2.4)

## 2023-09-27 LAB — DRVVT MIX: dRVVT Mix: 47.8 s — ABNORMAL HIGH (ref 0.0–40.4)

## 2023-09-27 LAB — HEPATITIS B SURFACE ANTIBODY, QUANTITATIVE: Hep B S AB Quant (Post): <3.5 m[IU]/mL — ABNORMAL LOW

## 2023-09-27 LAB — HEXAGONAL PHASE PHOSPHOLIPID: Hexagonal Phase Phospholipid: 5 s (ref 0–11)

## 2023-09-27 LAB — HEPATITIS B CORE ANTIBODY, TOTAL: HEP B CORE AB: NEGATIVE

## 2023-09-27 LAB — DRVVT CONFIRM: dRVVT Confirm: 0.8 ratio (ref 0.8–1.2)

## 2023-09-27 LAB — PTT-LA MIX: PTT-LA Mix: 41.3 s — ABNORMAL HIGH (ref 0.0–40.5)

## 2023-09-27 MED ORDER — ALBUTEROL SULFATE (2.5 MG/3ML) 0.083% IN NEBU
2.5000 mg | INHALATION_SOLUTION | Freq: Once | RESPIRATORY_TRACT | Status: AC
  Administered 2023-09-27: 2.5 mg via RESPIRATORY_TRACT

## 2023-09-27 MED ORDER — POTASSIUM CHLORIDE CRYS ER 20 MEQ PO TBCR
20.0000 meq | EXTENDED_RELEASE_TABLET | Freq: Every day | ORAL | Status: DC
  Administered 2023-09-27: 20 meq via ORAL
  Filled 2023-09-27: qty 1

## 2023-09-27 MED ORDER — MEXILETINE HCL 200 MG PO CAPS
200.0000 mg | ORAL_CAPSULE | Freq: Two times a day (BID) | ORAL | 0 refills | Status: AC
  Filled 2023-09-27: qty 60, 30d supply, fill #0

## 2023-09-27 MED ORDER — POTASSIUM CHLORIDE CRYS ER 20 MEQ PO TBCR
20.0000 meq | EXTENDED_RELEASE_TABLET | Freq: Every day | ORAL | Status: DC
Start: 2023-09-27 — End: 2023-10-04

## 2023-09-27 MED ORDER — LOSARTAN POTASSIUM 25 MG PO TABS
12.5000 mg | ORAL_TABLET | Freq: Every day | ORAL | 0 refills | Status: AC
  Filled 2023-09-27: qty 30, 60d supply, fill #0

## 2023-09-27 NOTE — Progress Notes (Signed)
 LVAD Initial Psychosocial Screening  Date/Time Initiated:  9:00am Referral Source:  Heart Failure Team/LVAD coordinators  Referral Reason:  CSW LVAD Initial Psychosocial Screening Source of Information:  patient  Demographics Name:  Robert Daniels Address:  [address] [place], [place] [address] Home phone:  [phone] (home)     Marital Status: Single  Faith:  none reported Primary Language:  English Last 4 # SS: [id number]  DOB: 1985-11-23   Psychological Health Appearance:  Unremarkable and In hospital gown Mental Status:  Alert, oriented Eye Contact:  Good Thought Content:  Coherent Speech:  Logical/coherent Mood:  Appropriate and Other (Comment) somewhat frustrated with LVAD work up process/timeline Affect:  Appropriate to circumstance Insight:  Fair Judgement: Unimpaired Interaction Style:  Engaged and Minimal- answered all questions but was somewhat vague with responses and did not offer details without prompting    Family/Social Information Who lives in your home? Name: Robert Daniels  Relationship: Long term on again off again girlfriend   Other family members/support persons in your life? Name: Robert Daniels  Relationship: Mother Robert Daniels     Grandmother Robert Daniels      Grandmother  Also reports 7 children aged 3months to 72 years old   Community Are you active with community agencies/resources/homecare? No  Are you active in a church, synagogue, mosque or other faith based community? No  Do you have other sources do you have for spiritual support? N/a Are you active in any clubs or social organizations? no What do you do for fun?  Hobbies?  Interests? Likes to play basketball   Home Environment/Personal Care Do you have reliable phone service? Yes  If so, what is the number?  [phone] Do you own or rent your home? Reports home is owned by his significant other Robert Daniels Number of steps into the home? 0 How many levels in the home? 2- bedroom is  on 2nd floor Electrical needs for LVAD (3 prong outlets)? yes Second hand smoke exposure in the home? no Travel distance from [facility]? 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 No Can you budget for the monthly cost for dressing supplies post procedure? Yes   Reports he has no concerns with keeping up with monthly expenses when he is unable to work following surgery.  Primary Health insurance:  [employer] Medicaid Have you ever had to refuse medication due to cost?  No Do you use mail order for your prescriptions?  No- gets from [facility] Have you applied for Social Security Disability/(SSI)  no but is planning to apply online after DC from hospital   Education/Work Information What is the last grade of school you completed? 12th Do you have any problems with reading or writing?  No Preferred method of learning?  Hands on Are you currently employed?  No- not employed officially but works under the table If not currently employed when did you last work?  Has never worked for an [employer]- has only ever done under the table work  Did you serve in the [employer]? No    Advance Directives: Do you have a Living Will or Medical POA? No  Have you had a consult with the Palliative Care Team at [facility]? Yes   Legal Do you currently have any legal issues/problems?  Yes- currently with outstanding charges was supposed to have court date today.  Medical Information Do you have any family history of heart problems? no Do you have a PCP or other medical provider? No- is working on getting PCP.  Per chart pt had seen [facility] in 2023 but does not appear to have seen since then Are you able to complete your ADL's?  yes Do you have any assistive devices in the home? no How many hours do you sleep at night? Not sure- sleeps  sporatically throughout the night and day and doesn't have a normal sleep schedule. How is your appetite? Reports he has a good appetite. Do you smoke now or past usage? Yes    Quit date: 09/16/23- states he had quit for about 3 months prior to this but started smoking again after stressful event. Do you drink alcohol now or past usage? Yes  states he drinks socially on weekends   Are you currently using illegal drugs or misuse of medication or past usage? never  Have you ever been treated for substance abuse? No   Mental Health History How have you been feeling in the past year? Good.  States even with his health causing him some stress he is very realistic and feels like it is what it is.  States he stays positive and has never felt depressed.  PHQ2 Depression Scale: 0  Have you ever had any problems with depression, anxiety or other mental health concerns? no Have you or are you taking medications for anxiety/depression or any mental health concerns?  No  Have you had any past or current thoughts of suicide? no Are there any other current stressors in your life?  no Do you see a counselor, psychiatrist or therapist?  no If you are currently experiencing problems are you interested in talking with a professional? No What are your coping strategies under stressful situations? States sometimes he drinks when stressed but also finds listening to music or spending time with his kids to be helpful.  Would you be interested in attending the LVAD support group? no   Medical & Follow-up Do you take your medications as prescribed by the doctors? yes Do you feel as if you have a good understanding of your medications and what they are for? yes If you experience medical concerns or barriers to care in between appointments how do you manage this? Usually comes to the ED to be evaluated.  Did voice knowledge of when to be concerned with weight gain. No Show Rate Percentage: 19%    Caregiving  Needs Who is the primary caregiver? Robert Daniels Health status:  good Do you drive?  yes Do you work?  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  no Contact [phone]  Who is the secondary caregiver? Robert Daniels Health status:  good Do you drive?  yes Do you work?  Yes- but he is unaware of her hours  Physical Limitations:  none Do you have other care giving responsibilities?  no Contact [phone]   Plan for VAD Implementation Do you know and understand what happens during the VAD surgery? Patient verbalizes limited understanding of what an LVAD is and what the surgery looks like. What do you know about the risks and side effect associated with VAD surgery?Patient understands basic risks of surgery. Explain what will happen right after surgery: Patient understands he would be in hospital for extended  stay.  Overall patient reports poor understanding of the procedure and device and feels as if he has not been educated thoroughly by staff regarding this.  Wants to have a family meeting so team can make sure his family is aware of above.  What is your plan for transportation for the first 8 weeks post-surgery? (Patients are not recommended to drive post-surgery for 8 weeks)  Driver: Robert Daniels or other family members/friends Do you have airbags in your vehicle?  There is a risk of discharging the device if the airbag were to deploy. yes What do you know about your diet post-surgery? Patient Verbalizes Understanding  of Heart healthy How do you plan to complete ADL's post-surgery?  With help from family/friends Will it be difficult to ask for help from your caregivers?  no  Please explain what you hope will be improved about your life as a result of receiving the LVAD? Wants to live so he can spend time with  his family.  Doesn't feel as if he has been restricted much on mobility other than stairs Please tell me your biggest concern or fear about living with the LVAD?  None Please explain your understanding of how your body will change?  Expresses understanding that he will have driveline and have to carry controller and batteries. Are you worried about these changes? Has no concerns about this. Do you see any barriers to your surgery or follow-up? no  Understanding of LVAD Patient states understanding of the following: Surgical procedures and risks, Electrical need for LVAD (3 prong outlets), Safety precautions with LVAD (water, etc.), LVAD daily self-care (dressing changes, computer check, extra supplies), Outpatient follow up (LVAD clinic appts, monitoring blood thinners), and Need for Emergency Planning  Discussed and Reviewed with Patient and Caregiver  Patient's current level of motivation to prepare for LVAD:  Patient's present Level of Consent for LVAD:     Clinical Interventions Needed:    CSW will monitor signs and symptoms of depression and assist with adjustment to life with an LVAD.  CSW will refer patient for Advanced Directives, if not completed prior to surgery if still wishing to complete.  CSW encouraged attendance with the LVAD Support Group to assist further with adjustment and post implant peer support.   Clinical Impressions/Recommendations:     CSW met with Robert Daniels at bedside during inpatient hospital stay to complete psychosocial assessment for potential LVAD placement.  Pt is a 38 yo male who presents as alert and oriented during interview.  He was responsive but did not offer much detail during interview and admits to be exasperated by hospital stay and lengthy work up process.    Pt lives with his long term on again off again girlfriend, Robert, and reports strong support from her.  Also reports many family members in this area including his mom and two  grandmothers as well as 7 children between the ages of 3 months and 21 years. He identifies his girlfriend and his mom as the caregivers post surgery but states he has a lot of support in the community so has no concerns about having assistance when needed.  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  Has no concerns about losing housing or being unable to pay for bills when he has to stop working temporarily following surgery.  He is also considering pursing disability income given his condition but has not started this process.  Robert Daniels reports no history of mental health concerns.  States he has been feeling well mentally over the past year and despite some stress surrounding his health he reports no feelings of depression or anxiety.  He has no interest in meeting with a mental health professional or attending LVAD Support Group.  Robert Daniels reports his main coping mechanisms when stressed is to listen to music and to spend time with his children.  He admits to recent tobacco use and recreational alcohol use but believes he can stop smoking as he had just been nicotine  free for several months prior to a relapse.  States he does not use illegal substances and has never had treatment for any substance abuse concerns.    He identifies his long term on again off again girlfriend, Robert, as the primary caregiver.  States she works full time at [employer] (thinks as an aid) but that she has no other caregiving responsibilities.  States she has no physical limitations and that she drives and has access to a car to bring to an from appts.  Secondary caregiver he identifies would be his mom who he also reports works full time (though he is unsure of hours) and has no physical limitations.  CSW inquired about plan for 24 hour care with both caregivers working and he states he has no concerns about this as he has many other family  member and friends who can step in those first 2 weeks.  Though Robert Daniels does not seem to have a good understanding of the LVAD at this time he is motivated to learn more and particularly to have his family learn more about it.  He reports being very motivated to move forward if this is the only thing that will help him live longer at this time.  CSW will continue to follow in the outpatient setting and plan to complete caregiver assessment with patient and family at post hospital follow up visit.  Andriette HILARIO Leech, LCSW Clinical Social Worker [facility] Desk#: [phone] Cell#: [phone]

## 2023-09-27 NOTE — Progress Notes (Signed)
 Initial Nutrition Assessment  DOCUMENTATION CODES:   Obesity unspecified  INTERVENTION:  -Continue heart healthy menu, regular texture, thin liquids -Daily weights -Continue to encourage limiting sodium and fluid intake   NUTRITION DIAGNOSIS:   Other (Comment) (Excess sodium intake) related to chronic illness (CHF) as evidenced by edema (15 lb weight gain).  GOAL:   Patient will meet greater than or equal to 90% of their needs  MONITOR:   PO intake, Skin, Supplement acceptance, Labs, Weight trends  REASON FOR ASSESSMENT:   Consult Assessment of nutrition requirement/status  ASSESSMENT:   Hx acute systolic CHF, LV thrombus, history of substance abuse, chronic kidney disease stage II who presented with complaints of shortness of breath and weight gain.  He mentioned that he has gained about 15 pounds in 2 days.  Spoke to pt at bedside. Pt denies n/v/c/d or chewing/swallowing difficulties. Last BM 9/13. Pt states weight gain of 15 lbs x 2 day prior to admission; suspect r/t CHF, fluid accumulation. Pt states good appetite, documented PO intake of 84% x 8 meals. Pt denies need for additional diet education at this time (RD ed consult 9/8). Encouraged pt to continue to limit sodium and fluid intake. Pt verbalized understanding. NFPE completed (see below), pt appears well nourished at this time. Pt denies additional questions/concerns at this time, will continue to monitor, RDN available prn.  Labs BG 86-92 Cr 1.53 Ca 8.6 Albumin 2.9 ALT 45 Prealbumin 17 GFR 59 HDL 23 H/H 12.8/38.5 A1c 5.4%   Medications  apixaban   5 mg Oral BID   ascorbic acid   500 mg Oral Daily   Chlorhexidine  Gluconate Cloth  6 each Topical Daily   digoxin   0.0625 mg Oral Daily   empagliflozin   10 mg Oral Daily   losartan   12.5 mg Oral Daily   mexiletine  200 mg Oral BID   polyethylene glycol  17 g Oral Daily   potassium chloride   20 mEq Oral Daily   sodium chloride  flush  10-40 mL Intracatheter  Q12H   sodium chloride  flush  3 mL Intravenous Q12H   spironolactone   25 mg Oral Daily   torsemide   40 mg Oral Daily     NUTRITION - FOCUSED PHYSICAL EXAM:  Flowsheet Row Most Recent Value  Orbital Region No depletion  Upper Arm Region No depletion  Thoracic and Lumbar Region No depletion  Buccal Region No depletion  Temple Region No depletion  Clavicle Bone Region No depletion  Clavicle and Acromion Bone Region No depletion  Scapular Bone Region No depletion  Dorsal Hand No depletion  Patellar Region No depletion  Anterior Thigh Region No depletion  Posterior Calf Region No depletion  Edema (RD Assessment) Mild  Hair Reviewed  Eyes Reviewed  Mouth Reviewed  Skin Reviewed  Nails Reviewed    Diet Order:   Diet Order             Diet Heart Room service appropriate? Yes; Fluid consistency: Thin  Diet effective now                   EDUCATION NEEDS:   Education needs have been addressed  Skin:  Skin Assessment: Reviewed RN Assessment  Last BM:  9/13  Height:   Ht Readings from Last 1 Encounters:  09/17/23 6' (1.829 m)    Weight:   Wt Readings from Last 1 Encounters:  09/27/23 119.6 kg   BMI:  Body mass index is 35.76 kg/m.  Estimated Nutritional Needs:   Kcal:  2250-2700 kcal  Protein:  100-125 g  Fluid:  >/=2.2 L  Robert Daniels, RDN, LDN Registered Dietitian Nutritionist RD Inpatient Contact Info in [place]

## 2023-09-27 NOTE — Consult Note (Signed)
 Consultation Note Date: 09/27/2023   Patient Name: Robert Daniels  DOB: 1985/07/27  MRN: [id number]  Age / Sex: 38 y.o., male  PCP: Lorren Greig PARAS, NP Referring Physician: Fairy Frames, MD  Reason for Consultation: VAD evaluation  HPI/Patient Profile: 38 y.o. male  with past medical history of HFrEF, LV thrombus, history of substance abuse - cocaine, current tobacco use, sleep apnea, CKD stage 2 admitted on 09/16/2023 with acute on chronic heart failure exacerbation. Work up for LVAD but RV failure is a concern.   Clinical Assessment and Goals of Care: Consult received and chart review completed. I met today with Lynwood but no family/visitors at bedside. Carrol is awake and sitting on side of bed. He speaks with me but not overly engaged in conversation and discussion. We reviewed VAD including goals, purpose, care responsibilities, risks/complications, difficult recovery, and need for good social support. We discussed RV failure and concern with how he would tolerate VAD with RV failure. We discussed use of inotropes although currently not requiring. We discussed the significant of VAD procedure and change in lifestyle. Raymir is very clear that he wants to live. He has no concerns about VAD or care. He shares that he has much support and much family locally. He shares that he has care support from his significant other, mother, grandmother, siblings. He also has 7 children 3 mo to 14 yo that he wants to be live longer to spend time with.  We discussed risks and complications that may occur with VAD surgery or down the road. I asked if there were any interventions of limitations of what he would NOT be willing to go through to live longer - he shares there are none - I want to live. We discussed HCPOA and Living Will. When asked about who he trusts to make difficult decisions if he is unable he believes this person  would be his mother. He accepts paperwork and I encouraged him to complete and bring back to VAD clinic. He is anxious for discharge today.   We discussed enjoyment in life and he reports he gets this from his children. He has met with VAD patient and found this helpful. I asked about his concerns or worries multiple times and he shares he has none. I did explain this is a big deal and it is natural that he would have some concerns but he denies any. He tells me that he feels that everything will be smooth and no problems - he is just anxious to move forward.   All questions/concerns addressed. Emotional support provided.   Primary Decision Maker PATIENT, then mother - provided with HCPOA paperwork to complete    SUMMARY OF RECOMMENDATIONS   - Full code, full scope - No limitations - I want to live - I worry about his understanding of the significance of the procedure and coping skills with life changes - Would need solid pain management plan post-op given substance abuse history  Code Status/Advance Care Planning: Full code   Symptom Management:  Per heart failure team.   Prognosis:  Overall poor with advanced heart failure - considering VAD.   Discharge Planning: Home with Home Health      Primary Diagnoses: Present on Admission:  Acute on chronic systolic CHF (congestive heart failure) (HCC)  CKD (chronic kidney disease) stage 2, GFR 60-89 ml/min  HTN (hypertension)   I have reviewed the medical record, interviewed the patient and family, and examined the patient. The following aspects are pertinent.  Past Medical History:  Diagnosis Date   AKI (acute kidney injury) (HCC)    CHF (congestive heart failure) (HCC)    Gout    Hypertension    Social History   Socioeconomic History   Marital status: Single    Spouse name: Not on file   Number of children: Not on file   Years of education: Not on file   Highest education level: Not on file  Occupational History    Not on file  Tobacco Use   Smoking status: Former    Types: Cigars, Cigarettes   Smokeless tobacco: Never  Vaping Use   Vaping status: Never Used  Substance and Sexual Activity   Alcohol use: Yes    Comment: occ   Drug use: Never   Sexual activity: Not on file  Other Topics Concern   Not on file  Social History Narrative   ** Merged History Encounter **       Social Drivers of Health   Financial Resource Strain: Low Risk  (08/02/2023)   Received from [facility]   Overall Financial Resource Strain (CARDIA)    How hard is it for you to pay for the very basics like food, housing, medical care, and heating?: Not very hard  Recent Concern: Financial Resource Strain - Medium Risk (07/23/2023)   Overall Financial Resource Strain (CARDIA)    Difficulty of Paying Living Expenses: Somewhat hard  Food Insecurity: No Food Insecurity (09/17/2023)   Hunger Vital Sign    Worried About Running Out of Food in the Last Year: Never true    Ran Out of Food in the Last Year: Never true  Transportation Needs: No Transportation Needs (09/17/2023)   PRAPARE - Administrator, Civil Service (Medical): No    Lack of Transportation (Non-Medical): No  Physical Activity: Insufficiently Active (06/21/2023)   Exercise Vital Sign    Days of Exercise per Week: 4 days    Minutes of Exercise per Session: 30 min  Stress: No Stress Concern Present (06/21/2023)   [employer] of Occupational Health - Occupational Stress Questionnaire    Feeling of Stress : Not at all  Social Connections: Unknown (06/10/2023)   Social Connection and Isolation Panel    Frequency of Communication with Friends and Family: More than three times a week    Frequency of Social Gatherings with Friends and Family: Twice a week    Attends Religious Services: 1 to 4 times per year    Active Member of [employer] or Organizations: No    Attends Banker Meetings: 1 to 4 times per year    Marital Status: Patient declined    History reviewed. No pertinent family history. Scheduled Meds:  apixaban   5 mg Oral BID   ascorbic acid   500 mg Oral Daily   Chlorhexidine  Gluconate Cloth  6 each Topical Daily   digoxin   0.0625 mg Oral Daily   empagliflozin   10 mg Oral Daily   losartan   12.5 mg Oral Daily  mexiletine  200 mg Oral BID   polyethylene glycol  17 g Oral Daily   potassium chloride   20 mEq Oral Daily   sodium chloride  flush  10-40 mL Intracatheter Q12H   sodium chloride  flush  3 mL Intravenous Q12H   spironolactone   25 mg Oral Daily   torsemide   40 mg Oral Daily   Continuous Infusions: PRN Meds:.acetaminophen , ondansetron  (ZOFRAN ) IV, sodium chloride  flush No Known Allergies Review of Systems  Physical Exam Vitals and nursing note reviewed.  Constitutional:      General: He is awake. He is not in acute distress. Cardiovascular:     Rate and Rhythm: Normal rate.  Pulmonary:     Effort: No tachypnea, accessory muscle usage or respiratory distress.  Abdominal:     General: Abdomen is flat.  Neurological:     Mental Status: He is alert and oriented to person, place, and time.     Vital Signs: BP 100/72 (BP Location: Left Arm)   Pulse 97   Temp 98.3 F (36.8 C) (Oral)   Resp 20   Ht 6' (1.829 m)   Wt 119.6 kg   SpO2 97%   BMI 35.76 kg/m  Pain Scale: 0-10   Pain Score: 0-No pain   SpO2: SpO2: 97 % O2 Device:SpO2: 97 % O2 Flow Rate: .O2 Flow Rate (L/min): 0 L/min  IO: Intake/output summary:  Intake/Output Summary (Last 24 hours) at 09/27/2023 0920 Last data filed at 09/27/2023 9177 Gross per 24 hour  Intake 960 ml  Output 1750 ml  Net -790 ml    LBM: Last BM Date : 09/25/23 Baseline Weight: Weight: 122.5 kg Most recent weight: Weight: 119.6 kg     Palliative Assessment/Data:     Time Total: 60 min  Greater than 50%  of this time was spent counseling and coordinating care related to the above assessment and plan.  Signed by: Bernarda Kitty, NP Palliative Medicine  Team Pager # [phone] (M-F 8a-5p) Team Phone # [phone] (Nights/Weekends)

## 2023-09-27 NOTE — Progress Notes (Signed)
 HF Cardiologist: Dr. Cherrie Advanced Heart Failure Rounding Note   Chief Complaint: Heart Failure/ Difficulty sleeping  Subjective:    RHC 09/08 on milrinone  0.25 RA = 6 RV = 50/10 PA = 45/28 (31) PCW = 30 Fick cardiac output/index = 5.5/2.3 PVR = 0.2 WU Ao sat = 95% PA sat = 57%, 61% PAPi = 2.8   Milrinone  weaned off 9/11. VAD work up ongoing. CO-OX 56.5%  Having a hard time sleeping. Wants to go home.   Objective:    Vital Signs:   Temp:  [97.6 F (36.4 C)-99.3 F (37.4 C)] 98.7 F (37.1 C) (09/15 0427) Pulse Rate:  [96-104] 97 (09/15 0427) Resp:  [16-20] 16 (09/15 0427) BP: (91-100)/(64-79) 98/73 (09/15 0427) SpO2:  [97 %-100 %] 99 % (09/15 0427) Weight:  [119.6 kg] 119.6 kg (09/15 0536) Last BM Date : 09/25/23  Weight change: Filed Weights   09/25/23 0316 09/26/23 0419 09/27/23 0536  Weight: 121 kg 119.6 kg 119.6 kg    Intake/Output:   Intake/Output Summary (Last 24 hours) at 09/27/2023 0612 Last data filed at 09/27/2023 0537 Gross per 24 hour  Intake 480 ml  Output 1750 ml  Net -1270 ml    CVP 6-7 personally checked.  Physical Exam: General:   No resp difficulty Neck: no JVD.  Cor: Regular rate & rhythm.  Lungs: clear Abdomen: soft, nontender, nondistended.  Extremities: no  edema. RUE PICC Neuro: alert & oriented x3   Telemetry: ST 100s occasional PVCs   Labs: Basic Metabolic Panel: Recent Labs  Lab 09/22/23 0510 09/23/23 0500 09/24/23 0440 09/25/23 0638 09/26/23 0500  NA 136 133* 135 135 136  K 3.5 4.0 4.1 4.3 4.0  CL 99 99 100 101 101  CO2 26 25 23 23 24   GLUCOSE 103* 136* 96 86 92  BUN 14 11 11 13 14   CREATININE 1.50* 1.45* 1.39* 1.53* 1.53*  CALCIUM 8.8* 8.9 8.9 8.9 8.8*  MG 2.1 2.2 2.3 2.2 2.3    Liver Function Tests: Recent Labs  Lab 09/25/23 0638 09/26/23 0500  AST 34 34  ALT 47* 45*  ALKPHOS 58 64  BILITOT 1.6* 0.9  PROT 7.2 7.5  ALBUMIN 3.0* 2.9*   No results for input(s): LIPASE, AMYLASE in the  last 168 hours. No results for input(s): AMMONIA in the last 168 hours.  CBC: Recent Labs  Lab 09/20/23 0712 09/20/23 0836 09/20/23 0838 09/20/23 0840 09/21/23 0348 09/22/23 0510  WBC 12.1*  --   --   --  10.2 9.7  HGB 11.9* 11.9* 11.9* 11.6* 12.6* 12.8*  HCT 35.8* 35.0* 35.0* 34.0* 37.6* 38.5*  MCV 85.2  --   --   --  84.9 85.0  PLT 266  --   --   --  289 334    Cardiac Enzymes: No results for input(s): CKTOTAL, CKMB, CKMBINDEX, TROPONINI in the last 168 hours.  BNP: BNP (last 3 results) Recent Labs    07/20/23 1154 09/16/23 2353 09/26/23 1215  BNP 925.3* 2,277.9* 852.7*    ProBNP (last 3 results) No results for input(s): PROBNP in the last 8760 hours.    Other results:  Imaging: VAS US  LOWER EXTREMITY VENOUS (DVT) Result Date: 09/25/2023  Lower Venous DVT Study Patient Name:  Robert Daniels  Date of Exam:   09/25/2023 Medical Rec #: [id number]         Accession #:    [id number] Date of Birth: 11-17-1985         Patient  Gender: M Patient Age:   38 years Exam Location:  [facility] Procedure:      VAS US  LOWER EXTREMITY VENOUS (DVT) Referring Phys: TORIBIO BENSIMHON --------------------------------------------------------------------------------  Indications: Preoperative evaluation of a medical condition to rule out surgical contraindications (TAR required) [293157], and Pre-op.  Risk Factors: None identified. Comparison Study: No prior studies. Performing Technologist: Cordella Collet RVT  Examination Guidelines: A complete evaluation includes B-mode imaging, spectral Doppler, color Doppler, and power Doppler as needed of all accessible portions of each vessel. Bilateral testing is considered an integral part of a complete examination. Limited examinations for reoccurring indications may be performed as noted. The reflux portion of the exam is performed with the patient in reverse Trendelenburg.   +---------+---------------+---------+-----------+----------+--------------+ RIGHT    CompressibilityPhasicitySpontaneityPropertiesThrombus Aging +---------+---------------+---------+-----------+----------+--------------+ CFV      Full           Yes      Yes                                 +---------+---------------+---------+-----------+----------+--------------+ SFJ      Full                                                        +---------+---------------+---------+-----------+----------+--------------+ FV Prox  Full                                                        +---------+---------------+---------+-----------+----------+--------------+ FV Mid   Full                                                        +---------+---------------+---------+-----------+----------+--------------+ FV DistalFull                                                        +---------+---------------+---------+-----------+----------+--------------+ PFV      Full                                                        +---------+---------------+---------+-----------+----------+--------------+ POP      Full           Yes      Yes                                 +---------+---------------+---------+-----------+----------+--------------+ PTV      Full                                                        +---------+---------------+---------+-----------+----------+--------------+  PERO     Full                                                        +---------+---------------+---------+-----------+----------+--------------+   +---------+---------------+---------+-----------+----------+--------------+ LEFT     CompressibilityPhasicitySpontaneityPropertiesThrombus Aging +---------+---------------+---------+-----------+----------+--------------+ CFV      Full           Yes      Yes                                  +---------+---------------+---------+-----------+----------+--------------+ SFJ      Full                                                        +---------+---------------+---------+-----------+----------+--------------+ FV Prox  Full                                                        +---------+---------------+---------+-----------+----------+--------------+ FV Mid   Full                                                        +---------+---------------+---------+-----------+----------+--------------+ FV DistalFull                                                        +---------+---------------+---------+-----------+----------+--------------+ PFV      Full                                                        +---------+---------------+---------+-----------+----------+--------------+ POP      Full           Yes      Yes                                 +---------+---------------+---------+-----------+----------+--------------+ PTV      Full                                                        +---------+---------------+---------+-----------+----------+--------------+ PERO     Full                                                        +---------+---------------+---------+-----------+----------+--------------+  Summary: RIGHT: - There is no evidence of deep vein thrombosis in the lower extremity.  - No cystic structure found in the popliteal fossa.  LEFT: - There is no evidence of deep vein thrombosis in the lower extremity.  - No cystic structure found in the popliteal fossa.  *See table(s) above for measurements and observations. Electronically signed by Debby Robertson on 09/25/2023 at 2:29:09 PM.    Final    VAS US  DOPPLER PRE VAD Result Date: 09/25/2023 PERIOPERATIVE VASCULAR EVALUATION Patient Name:  Robert Daniels  Date of Exam:   09/25/2023 Medical Rec #: [id number]         Accession #:    [id number] Date of Birth: 1985-05-11         Patient  Gender: M Patient Age:   67 years Exam Location:  [facility] Procedure:      VAS US  DOPPLER PRE VAD Referring Phys: TORIBIO BENSIMHON --------------------------------------------------------------------------------  Indications:      Preoperative evaluation of a medical condition to rule out                   surgical contraindications (TAR required) [293157]. Risk Factors:     Hypertension, current smoker. Limitations:      Restricted right arm Comparison Study: No prior studies. Performing Technologist: Gerome Ny RVT  Examination Guidelines: A complete evaluation includes B-mode imaging, spectral Doppler, color Doppler, and power Doppler as needed of all accessible portions of each vessel. Bilateral testing is considered an integral part of a complete examination. Limited examinations for reoccurring indications may be performed as noted.  Right Carotid Findings: +----------+--------+--------+--------+-----------------------+--------+           PSV cm/sEDV cm/sStenosisDescribe               Comments +----------+--------+--------+--------+-----------------------+--------+ CCA Prox  73      13                                              +----------+--------+--------+--------+-----------------------+--------+ CCA Distal58      18                                              +----------+--------+--------+--------+-----------------------+--------+ ICA Prox  33      13              smooth and heterogenous         +----------+--------+--------+--------+-----------------------+--------+ ICA Mid   36      18                                              +----------+--------+--------+--------+-----------------------+--------+ ICA Distal57      26                                              +----------+--------+--------+--------+-----------------------+--------+ ECA       45      6                                                +----------+--------+--------+--------+-----------------------+--------+ +----------+--------+-------+--------+------------+  PSV cm/sEDV cmsDescribeArm Pressure +----------+--------+-------+--------+------------+ [id number]                                  +----------+--------+-------+--------+------------+ +---------+--------+--+--------+--+---------+ VertebralPSV cm/s43EDV cm/s14Antegrade +---------+--------+--+--------+--+---------+ Left Carotid Findings: +----------+--------+--------+--------+-----------------------+--------+           PSV cm/sEDV cm/sStenosisDescribe               Comments +----------+--------+--------+--------+-----------------------+--------+ CCA Prox  97      22                                              +----------+--------+--------+--------+-----------------------+--------+ CCA Distal53      18              smooth and heterogenous         +----------+--------+--------+--------+-----------------------+--------+ ICA Prox  33      18                                              +----------+--------+--------+--------+-----------------------+--------+ ICA Mid   57      25                                              +----------+--------+--------+--------+-----------------------+--------+ ICA Distal53      23                                              +----------+--------+--------+--------+-----------------------+--------+ ECA       42      10                                              +----------+--------+--------+--------+-----------------------+--------+ +----------+--------+--------+--------+------------+ SubclavianPSV cm/sEDV cm/sDescribeArm Pressure +----------+--------+--------+--------+------------+           140                     100          +----------+--------+--------+--------+------------+ +---------+--------+--+--------+--+---------+ VertebralPSV cm/s45EDV cm/s16Antegrade  +---------+--------+--+--------+--+---------+  ABI Findings: +--------+------------------+-----+---------+--------------+ Right   Rt Pressure (mmHg)IndexWaveform Comment        +--------+------------------+-----+---------+--------------+ Brachial                                Restricted arm +--------+------------------+-----+---------+--------------+ PTA     94                0.94 triphasic               +--------+------------------+-----+---------+--------------+ DP      92                0.92 triphasic               +--------+------------------+-----+---------+--------------+ +--------+------------------+-----+---------+-------+ Left    Lt Pressure (mmHg)IndexWaveform Comment +--------+------------------+-----+---------+-------+ Brachial100  triphasic        +--------+------------------+-----+---------+-------+ PTA     111               1.11 triphasic        +--------+------------------+-----+---------+-------+ DP      99                0.99 triphasic        +--------+------------------+-----+---------+-------+ +-------+---------------+----------------+ ABI/TBIToday's ABI/TBIPrevious ABI/TBI +-------+---------------+----------------+ Right  0.94                            +-------+---------------+----------------+ Left   1.11                            +-------+---------------+----------------+  Summary: Right Carotid: Velocities in the right ICA are consistent with a 1-39% stenosis. Left Carotid: Velocities in the left ICA are consistent with a 1-39% stenosis. Vertebrals: Bilateral vertebral arteries demonstrate antegrade flow.  *See table(s) above for measurements and observations. Right ABI: Resting right ankle-brachial index indicates mild right lower extremity arterial disease. Left ABI: Resting left ankle-brachial index is within normal range.  Electronically signed by Debby Robertson on 09/25/2023 at 2:28:35 PM.    Final    DG  Orthopantogram Result Date: 09/25/2023 EXAM: Robert Daniels, 1 VIEW 09/25/2023 08:34:00 AM TECHNIQUE: Panorex view of the mandible. COMPARISON: None available. CLINICAL HISTORY: Preoperative evaluation of a medical condition to rule out surgical contraindications (TAR required). Reason for exam: LVAD workup. FINDINGS: DENTAL: Large dental caries is present in the second right mandibular molar with slight lucency about the two roots. A smaller dental caries is present about the right mandibular canine tooth. Dental caries are present in the residual left mandibular molar and in the second left maxillary premolar tooth. Smaller dental caries are present in the medial and lateral right maxillary and sizes. No other significant periapical disease is present. An unerupted tooth is noted below the roots of the central incisors of the mandible. BONES: No acute fracture or focal osseous lesion. TMJ: No dislocation. SOFT TISSUES: The soft tissues are unremarkable. IMPRESSION: 1. Multiple dental caries, including a large caries in the second right mandibular molar with slight lucency about the roots, and smaller caries in the right mandibular canine, residual left mandibular molar, and second left maxillary premolar. 2. Unerupted tooth below the roots of the central incisors of the mandible. Electronically signed by: Lonni Necessary MD 09/25/2023 12:24 PM EDT RP Workstation: [id number]       Medications:     Scheduled Medications:  apixaban   5 mg Oral BID   ascorbic acid   500 mg Oral Daily   Chlorhexidine  Gluconate Cloth  6 each Topical Daily   digoxin   0.0625 mg Oral Daily   empagliflozin   10 mg Oral Daily   losartan   12.5 mg Oral Daily   mexiletine  200 mg Oral BID   polyethylene glycol  17 g Oral Daily   sodium chloride  flush  10-40 mL Intracatheter Q12H   sodium chloride  flush  3 mL Intravenous Q12H   spironolactone   25 mg Oral Daily   torsemide   40 mg Oral Daily     Infusions:    PRN Medications: acetaminophen , ondansetron  (ZOFRAN ) IV, sodium chloride  flush   Assessment:   1. A/C Biventricular HFrEF , NICM  - Initially diagnosed with HFrEF in 2023.  Left heart catheter time with no CAD.  [facility] July 2025 --LVEF  15% RV severely reduced.  - July 2023 CPX VO2 12.7 (43%) 19 when adjusted  to ideal body weight. Slope 42 - Low output on admit w/ Coox 38%  - RHC 9/09 with elevated filling pressures and mild to moderately reduced CO (CI 2.3) on milrinone  0.25 - Weaned off milrinone  9/11. CO-OX stable 56.% - CVP 6-7. Continue torsemide  40 mg daily . Give 20 meq K Dur -GDMT limited by soft BP.  - No bb with suspected low output. - Continue digoxin  0.0625 - Continue  jardiance  10 mg daily  - Continue spiro 25 mg daily - Continue losartan  12.5 mg daily - Long talk about need and candidacy for advanced therapies. Blood Typ O+ Currently not a candidate for transplant with tobacco use. We discussed smoking cessation. Needs to avoid nicotine  6 months for consideration.   -VAD coordinators consented to VAD work up.  Orthopantogram - will need oral surgeon to review. CT chest/abd/pelvis .  - Will discuss with team. He is wants to go home and continue close follow up for possible VAD.   2. PVCs - Continue  Mexiletine 200 mg BID (started 09/09 ) - PVCs suppressed. Occasional  - Keep K > 4 and Mag <2    3. AKI due to cardiorenal syndrome - Baseline creatinine ~ 1.3-1.4. On admit 1.9. Now resolved.   4. Severe Sleep Apnea - AHI 54 Desaturation 78%, CPAP was recommended.  - Outpatient Pulmonary working on CPAP.   5. Tobacco Abuse - Recently started smoking again.  - Discussed cessation as above   6. LV Thrombus  - He has been on eliquis  for possible LV thrombus noted 02/2023. - Continue Eliquis    7. Obesity - Body mass index is 35.76 kg/m. - seen by Nutitionist (thanks)  8. Hypokalemia - Continue spiro - Supp with diuresis  Length of  Stay: 10   Mildred Bollard NP-C  09/27/2023, 6:12 AM  Advanced Heart Failure Team Pager [phone] (M-F; 7a - 4p)  Please contact [facility] for night-coverage after hours (4p -7a ) and weekends on [email or web address]

## 2023-09-27 NOTE — Discharge Summary (Addendum)
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 estimated right ventricular systolic pressure is 39.2 mmHg. Left Atrium: Left atrial size was mildly dilated. Right Atrium: Right atrial size was mildly dilated. Pericardium: There is no evidence of pericardial effusion. Mitral Valve: The mitral valve is grossly normal. Mild mitral valve regurgitation. Tricuspid Valve: The tricuspid valve is grossly normal. Tricuspid valve regurgitation is moderate to severe. Aortic Valve: The aortic valve is tricuspid. Aortic valve regurgitation is mild. Aortic valve sclerosis is present, with no evidence of aortic valve stenosis. Aortic valve peak gradient measures 2.0 mmHg. Pulmonic Valve: The pulmonic  valve was grossly normal. Pulmonic valve regurgitation is mild to moderate. No evidence of pulmonic stenosis. Aorta: The aortic root is normal in size and structure and the ascending aorta was not well visualized. Venous: The inferior vena cava is normal in size with greater than 50% respiratory variability, suggesting right atrial pressure of 3 mmHg. IAS/Shunts: The interatrial septum was not well visualized.  LEFT VENTRICLE PLAX 2D LVIDd:         7.91 cm LVIDs:         7.78 cm LV PW:         0.88 cm LV IVS:        0.91 cm LVOT diam:     2.00 cm LV SV:         32 LV SV Index:   13 LVOT Area:     3.14 cm  RIGHT VENTRICLE RV Basal diam:  5.64 cm RV Mid diam:    4.50 cm RV S prime:     8.59 cm/s TAPSE (M-mode): 1.3 cm LEFT ATRIUM              Index        RIGHT ATRIUM           Index LA Vol (A2C):   119.0 ml 48.14 ml/m  RA Area:     24.90 cm LA Vol (A4C):   82.1 ml  33.21 ml/m  RA Volume:   94.80 ml  38.35 ml/m LA  Biplane Vol: 99.3 ml  40.17 ml/m  AORTIC VALVE AV Area (Vmax): 3.30 cm AV Vmax:        71.30 cm/s AV Peak Grad:   2.0 mmHg LVOT Vmax:      75.00 cm/s LVOT Vmean:     54.100 cm/s LVOT VTI:       0.102 m  AORTA Ao Root diam: 3.32 cm MITRAL VALVE               TRICUSPID VALVE MV Area (PHT): 4.26 cm    TR Peak grad:   36.2 mmHg MV Decel Time: 178 msec    TR Vmax:        301.00 cm/s MV E velocity: 76.50 cm/s                            SHUNTS                            Systemic VTI:  0.10 m                            Systemic Diam: 2.00 cm Sunit Tolia Electronically signed by Madonna Large Signature Date/Time: 09/17/2023/5:41:34 PM    Final    US  EKG SITE RITE Result Date: 09/17/2023 If Site Rite image not attached, placement could not be confirmed due to current cardiac rhythm.  US  Abdomen Limited RUQ (LIVER/GB) Result Date: 09/17/2023 CLINICAL DATA:  Unspecified abdominal pain EXAM: ULTRASOUND ABDOMEN LIMITED RIGHT UPPER QUADRANT COMPARISON:  None Available. FINDINGS: Gallbladder: The gallbladder is  largely decompressed. Apparent pericholecystic fluid likely represents the decompressed, thickened gallbladder wall best appreciated on image # 58. No gallstones or abnormal wall thickening visualized. No sonographic Murphy sign noted by sonographer. Common bile duct: Diameter: Less than 1 mm (completely decompressed) Liver: No focal lesion identified. Within normal limits in parenchymal echogenicity. Portal vein is patent on color Doppler imaging with normal direction of blood flow towards the liver. Other: None. IMPRESSION: 1. Normal right upper quadrant sonogram. Electronically Signed   By: Dorethia Molt M.D.   On: 09/17/2023 01:30   DG Chest 2 View Result Date: 09/17/2023 CLINICAL DATA:  Dyspnea EXAM: CHEST - 2 VIEW COMPARISON:  06/10/2023 FINDINGS: Stable reticulonodular opacity within the right costophrenic angle, likely reflecting parenchymal scarring given stability over time. Lungs are otherwise clear. No pneumothorax or pleural effusion. Mild cardiomegaly is stable. Pulmonary vascularity is normal. No acute bone abnormality. IMPRESSION: 1. No active cardiopulmonary disease. 2. Stable mild cardiomegaly. Electronically Signed   By: Dorethia Molt M.D.   On: 09/17/2023 00:21    Microbiology: No results found for this or any previous visit (from the past 240 hours).   Labs: Basic Metabolic Panel: Recent Labs  Lab 09/23/23 0500 09/24/23 0440 09/25/23 0638 09/26/23 0500 09/27/23 0500  NA 133* 135 135 136 136  K 4.0 4.1 4.3 4.0 3.8  CL 99 100 101 101 103  CO2 25 23 23 24 24   GLUCOSE 136* 96 86 92 86  BUN 11 11 13 14 14   CREATININE 1.45* 1.39* 1.53* 1.53* 1.53*  CALCIUM 8.9 8.9 8.9 8.8* 8.6*  MG 2.2 2.3 2.2 2.3 2.1   Liver Function Tests: Recent Labs  Lab 09/25/23 0638 09/26/23 0500  AST 34 34  ALT 47* 45*  ALKPHOS 58 64  BILITOT 1.6* 0.9  PROT 7.2 7.5  ALBUMIN  3.0* 2.9*   No results for input(s): LIPASE, AMYLASE in the last 168 hours. No results for input(s): AMMONIA  in the last 168 hours. CBC: Recent Labs  Lab 09/21/23 0348 09/22/23 0510  WBC 10.2 9.7  HGB 12.6* 12.8*  HCT 37.6* 38.5*  MCV 84.9 85.0  PLT 289 334   Cardiac Enzymes: No results for input(s): CKTOTAL, CKMB, CKMBINDEX, TROPONINI in the last 168 hours. BNP: BNP (last 3 results) Recent Labs    07/20/23 1154 09/16/23 2353 09/26/23 1215  BNP 925.3* 2,277.9* 852.7*    ProBNP (last 3 results) No results for input(s): PROBNP in the last 8760 hours.  CBG: No results for input(s): GLUCAP in the last 168 hours.     Signed:  Sigurd Pac MD.  [facility] 09/27/2023, 3:27 PM

## 2023-09-27 NOTE — Progress Notes (Signed)
 Discussed with Dr Zenaida.   Ok to d/c after CT surgery consultation.   HFmeds  Digoxin  0.0625 mcg daily Jardiance  10 mg daily  Spironolactone  25 mg daily  Losartan  12.5 mg daily  Torsemide  40 mg daily  Mexilettine 200 mg twice a day  Eliquis  5 mg twice a day   HF follow up next week. Please sent meds to TOC.   Tyrone Pautsch NP-C  2:43 PM
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
## 2023-09-27 NOTE — Progress Notes (Addendum)
 PROGRESS NOTE    Robert Daniels  FMW:[id number] DOB: [birth year] DOA: 09/16/2023 PCP: Lorren Greig PARAS, NP  38/M with history of chronic systolic CHF, NICM, LV thrombus, CKD 3, substance abuse and obesity admitted with low output CHF.  Creatinine 1.8 on admission, BNP 2277, troponin 53, 58, lactate 2.8, chest x-ray with CHF - Initial Co. ox was low at 38, started on milrinone  - Improving with diuresis - 9/11, milrinone  discontinued - 9/12, Co. ox 41> repeat 54 - 9/13, Co. ox 48 -9/14: co-ox 47   Subjective: - Co. ox better today, feels well, no complaints  Assessment and Plan:  Acute on chronic biventricular failure, low output - Known NICM -Admitted with low output CHF, Co. ox low of 38 initially, improving with diuresis, milrinone  -17.5 L negative, appears euvolemic now -RHC 9/9 with elevated filling pressures, moderately reduced cardiac output on milrinone  - Continue torsemide , continue digoxin , Jardiance , Aldactone , losartan  -eats fast food and ultra processed food every day, counseled, dietitian consult appreciated -Co. ox low over the weekend, now improving, off milrinone  since 9/11, LVAD workup ongoing   AKI on CKD 2 - Cardiorenal, improving, close to baseline now  Hyperglycemia A1c is 5.4  Known severe OSA -Followed by pulmonary, sleep study noted severe sleep apnea, Dr. Neysa recommended AutoPap or CPAP titration sleep study, will send urgent referral back for orders   Tobacco Abuse - Counseled   History of LV Thrombus  - Continue Eliquis    Hypokalemia Repleted  History of substance use disorder Toxicology screen negative this time  Obesity, class 2 Calculated BMI is 38,4   DVT prophylaxis: Eliquis  Code Status: Full code Family Communication: None present Disposition Plan:   Consultants:    Procedures:   Antimicrobials:    Objective: Vitals:   09/27/23 0038 09/27/23 0427 09/27/23 0536 09/27/23 0821  BP: 91/71 98/73  100/72  Pulse: 99 97  97   Resp: 20 16  20   Temp: 97.6 F (36.4 C) 98.7 F (37.1 C)  98.3 F (36.8 C)  TempSrc: Oral Oral  Oral  SpO2: 100% 99%  97%  Weight:   119.6 kg   Height:        Intake/Output Summary (Last 24 hours) at 09/27/2023 1025 Last data filed at 09/27/2023 9177 Gross per 24 hour  Intake 960 ml  Output 1650 ml  Net -690 ml   Filed Weights   09/25/23 0316 09/26/23 0419 09/27/23 0536  Weight: 121 kg 119.6 kg 119.6 kg    Examination:  General exam: AAO x 3, no distress HEENT: No JVD Respiratory system: Clear to auscultation Cardiovascular system: S1 & S2 heard, RRR.  Abd: nondistended, soft and nontender.Normal bowel sounds heard. Central nervous system: Alert and oriented. No focal neurological deficits. Extremities: No edema, PICC line Skin: No rashes Psychiatry:  Mood & affect appropriate.     Data Reviewed:   CBC: Recent Labs  Lab 09/21/23 0348 09/22/23 0510  WBC 10.2 9.7  HGB 12.6* 12.8*  HCT 37.6* 38.5*  MCV 84.9 85.0  PLT 289 334   Basic Metabolic Panel: Recent Labs  Lab 09/23/23 0500 09/24/23 0440 09/25/23 0638 09/26/23 0500 09/27/23 0500  NA 133* 135 135 136 136  K 4.0 4.1 4.3 4.0 3.8  CL 99 100 101 101 103  CO2 25 23 23 24 24   GLUCOSE 136* 96 86 92 86  BUN 11 11 13 14 14   CREATININE 1.45* 1.39* 1.53* 1.53* 1.53*  CALCIUM 8.9 8.9 8.9 8.8* 8.6*  MG 2.2  2.3 2.2 2.3 2.1   GFR: Estimated Creatinine Clearance: 87.4 mL/min (A) (by C-G formula based on SCr of 1.53 mg/dL (H)). Liver Function Tests: Recent Labs  Lab 09/25/23 0638 09/26/23 0500  AST 34 34  ALT 47* 45*  ALKPHOS 58 64  BILITOT 1.6* 0.9  PROT 7.2 7.5  ALBUMIN 3.0* 2.9*   No results for input(s): LIPASE, AMYLASE in the last 168 hours. No results for input(s): AMMONIA in the last 168 hours. Coagulation Profile: No results for input(s): INR, PROTIME in the last 168 hours. Cardiac Enzymes: No results for input(s): CKTOTAL, CKMB, CKMBINDEX, TROPONINI in the last 168  hours. BNP (last 3 results) No results for input(s): PROBNP in the last 8760 hours. HbA1C: No results for input(s): HGBA1C in the last 72 hours.  CBG: No results for input(s): GLUCAP in the last 168 hours. Lipid Profile: Recent Labs    09/25/23 0638  CHOL 79  HDL 23*  LDLCALC 40  TRIG 80  CHOLHDL 3.4   Thyroid Function Tests: No results for input(s): TSH, T4TOTAL, FREET4, T3FREE, THYROIDAB in the last 72 hours. Anemia Panel: No results for input(s): VITAMINB12, FOLATE, FERRITIN, TIBC, IRON, RETICCTPCT in the last 72 hours. Urine analysis:    Component Value Date/Time   COLORURINE STRAW (A) 06/10/2023 2209   APPEARANCEUR CLEAR 06/10/2023 2209   LABSPEC 1.002 (L) 06/10/2023 2209   PHURINE 7.0 06/10/2023 2209   GLUCOSEU >=500 (A) 06/10/2023 2209   HGBUR NEGATIVE 06/10/2023 2209   BILIRUBINUR NEGATIVE 06/10/2023 2209   KETONESUR NEGATIVE 06/10/2023 2209   PROTEINUR NEGATIVE 06/10/2023 2209   UROBILINOGEN 0.2 04/09/2012 0651   NITRITE NEGATIVE 06/10/2023 2209   LEUKOCYTESUR NEGATIVE 06/10/2023 2209   Sepsis Labs: @LABRCNTIP (procalcitonin:4,lacticidven:4)  ) No results found for this or any previous visit (from the past 240 hours).    Radiology Studies: VAS US  LOWER EXTREMITY VENOUS (DVT) Result Date: 09/25/2023  Lower Venous DVT Study Patient Name:  Robert Daniels  Date of Exam:   09/25/2023 Medical Rec #: [id number]         Accession #:    [id number] Date of Birth: 09/26/1985         Patient Gender: M Patient Age:   54 years Exam Location:  [facility] Procedure:      VAS US  LOWER EXTREMITY VENOUS (DVT) Referring Phys: TORIBIO BENSIMHON --------------------------------------------------------------------------------  Indications: Preoperative evaluation of a medical condition to rule out surgical contraindications (TAR required) [293157], and Pre-op.  Risk Factors: None identified. Comparison Study: No prior studies. Performing  Technologist: Cordella Collet RVT  Examination Guidelines: A complete evaluation includes B-mode imaging, spectral Doppler, color Doppler, and power Doppler as needed of all accessible portions of each vessel. Bilateral testing is considered an integral part of a complete examination. Limited examinations for reoccurring indications may be performed as noted. The reflux portion of the exam is performed with the patient in reverse Trendelenburg.  +---------+---------------+---------+-----------+----------+--------------+ RIGHT    CompressibilityPhasicitySpontaneityPropertiesThrombus Aging +---------+---------------+---------+-----------+----------+--------------+ CFV      Full           Yes      Yes                                 +---------+---------------+---------+-----------+----------+--------------+ SFJ      Full                                                        +---------+---------------+---------+-----------+----------+--------------+  FV Prox  Full                                                        +---------+---------------+---------+-----------+----------+--------------+ FV Mid   Full                                                        +---------+---------------+---------+-----------+----------+--------------+ FV DistalFull                                                        +---------+---------------+---------+-----------+----------+--------------+ PFV      Full                                                        +---------+---------------+---------+-----------+----------+--------------+ POP      Full           Yes      Yes                                 +---------+---------------+---------+-----------+----------+--------------+ PTV      Full                                                        +---------+---------------+---------+-----------+----------+--------------+ PERO     Full                                                         +---------+---------------+---------+-----------+----------+--------------+   +---------+---------------+---------+-----------+----------+--------------+ LEFT     CompressibilityPhasicitySpontaneityPropertiesThrombus Aging +---------+---------------+---------+-----------+----------+--------------+ CFV      Full           Yes      Yes                                 +---------+---------------+---------+-----------+----------+--------------+ SFJ      Full                                                        +---------+---------------+---------+-----------+----------+--------------+ FV Prox  Full                                                        +---------+---------------+---------+-----------+----------+--------------+  FV Mid   Full                                                        +---------+---------------+---------+-----------+----------+--------------+ FV DistalFull                                                        +---------+---------------+---------+-----------+----------+--------------+ PFV      Full                                                        +---------+---------------+---------+-----------+----------+--------------+ POP      Full           Yes      Yes                                 +---------+---------------+---------+-----------+----------+--------------+ PTV      Full                                                        +---------+---------------+---------+-----------+----------+--------------+ PERO     Full                                                        +---------+---------------+---------+-----------+----------+--------------+     Summary: RIGHT: - There is no evidence of deep vein thrombosis in the lower extremity.  - No cystic structure found in the popliteal fossa.  LEFT: - There is no evidence of deep vein thrombosis in the lower extremity.  - No cystic structure found in  the popliteal fossa.  *See table(s) above for measurements and observations. Electronically signed by Debby Robertson on 09/25/2023 at 2:29:09 PM.    Final    VAS US  DOPPLER PRE VAD Result Date: 09/25/2023 PERIOPERATIVE VASCULAR EVALUATION Patient Name:  Robert Daniels  Date of Exam:   09/25/2023 Medical Rec #: [id number]         Accession #:    [id number] Date of Birth: May 24, 1985         Patient Gender: M Patient Age:   20 years Exam Location:  [facility] Procedure:      VAS US  DOPPLER PRE VAD Referring Phys: TORIBIO BENSIMHON --------------------------------------------------------------------------------  Indications:      Preoperative evaluation of a medical condition to rule out                   surgical contraindications (TAR required) [293157]. Risk Factors:     Hypertension, current smoker. Limitations:      Restricted right arm Comparison Study: No prior studies. Performing Technologist: Gerome Ny RVT  Examination Guidelines: A  complete evaluation includes B-mode imaging, spectral Doppler, color Doppler, and power Doppler as needed of all accessible portions of each vessel. Bilateral testing is considered an integral part of a complete examination. Limited examinations for reoccurring indications may be performed as noted.  Right Carotid Findings: +----------+--------+--------+--------+-----------------------+--------+           PSV cm/sEDV cm/sStenosisDescribe               Comments +----------+--------+--------+--------+-----------------------+--------+ CCA Prox  73      13                                              +----------+--------+--------+--------+-----------------------+--------+ CCA Distal58      18                                              +----------+--------+--------+--------+-----------------------+--------+ ICA Prox  33      13              smooth and heterogenous         +----------+--------+--------+--------+-----------------------+--------+  ICA Mid   36      18                                              +----------+--------+--------+--------+-----------------------+--------+ ICA Distal57      26                                              +----------+--------+--------+--------+-----------------------+--------+ ECA       45      6                                               +----------+--------+--------+--------+-----------------------+--------+ +----------+--------+-------+--------+------------+           PSV cm/sEDV cmsDescribeArm Pressure +----------+--------+-------+--------+------------+ [id number]                                  +----------+--------+-------+--------+------------+ +---------+--------+--+--------+--+---------+ VertebralPSV cm/s43EDV cm/s14Antegrade +---------+--------+--+--------+--+---------+ Left Carotid Findings: +----------+--------+--------+--------+-----------------------+--------+           PSV cm/sEDV cm/sStenosisDescribe               Comments +----------+--------+--------+--------+-----------------------+--------+ CCA Prox  97      22                                              +----------+--------+--------+--------+-----------------------+--------+ CCA Distal53      18              smooth and heterogenous         +----------+--------+--------+--------+-----------------------+--------+ ICA Prox  33      18                                              +----------+--------+--------+--------+-----------------------+--------+  ICA Mid   57      25                                              +----------+--------+--------+--------+-----------------------+--------+ ICA Distal53      23                                              +----------+--------+--------+--------+-----------------------+--------+ ECA       42      10                                               +----------+--------+--------+--------+-----------------------+--------+ +----------+--------+--------+--------+------------+ SubclavianPSV cm/sEDV cm/sDescribeArm Pressure +----------+--------+--------+--------+------------+           140                     100          +----------+--------+--------+--------+------------+ +---------+--------+--+--------+--+---------+ VertebralPSV cm/s45EDV cm/s16Antegrade +---------+--------+--+--------+--+---------+  ABI Findings: +--------+------------------+-----+---------+--------------+ Right   Rt Pressure (mmHg)IndexWaveform Comment        +--------+------------------+-----+---------+--------------+ Brachial                                Restricted arm +--------+------------------+-----+---------+--------------+ PTA     94                0.94 triphasic               +--------+------------------+-----+---------+--------------+ DP      92                0.92 triphasic               +--------+------------------+-----+---------+--------------+ +--------+------------------+-----+---------+-------+ Left    Lt Pressure (mmHg)IndexWaveform Comment +--------+------------------+-----+---------+-------+ Brachial100                    triphasic        +--------+------------------+-----+---------+-------+ PTA     111               1.11 triphasic        +--------+------------------+-----+---------+-------+ DP      99                0.99 triphasic        +--------+------------------+-----+---------+-------+ +-------+---------------+----------------+ ABI/TBIToday's ABI/TBIPrevious ABI/TBI +-------+---------------+----------------+ Right  0.94                            +-------+---------------+----------------+ Left   1.11                            +-------+---------------+----------------+  Summary: Right Carotid: Velocities in the right ICA are consistent with a 1-39% stenosis. Left Carotid: Velocities in the  left ICA are consistent with a 1-39% stenosis. Vertebrals: Bilateral vertebral arteries demonstrate antegrade flow.  *See table(s) above for measurements and observations. Right ABI: Resting right ankle-brachial index indicates mild right lower extremity arterial disease. Left ABI: Resting left ankle-brachial index is within normal range.  Electronically signed by Debby Robertson on 09/25/2023  at 2:28:35 PM.    Final      Scheduled Meds:  apixaban   5 mg Oral BID   ascorbic acid   500 mg Oral Daily   Chlorhexidine  Gluconate Cloth  6 each Topical Daily   digoxin   0.0625 mg Oral Daily   empagliflozin   10 mg Oral Daily   losartan   12.5 mg Oral Daily   mexiletine  200 mg Oral BID   polyethylene glycol  17 g Oral Daily   potassium chloride   20 mEq Oral Daily   sodium chloride  flush  10-40 mL Intracatheter Q12H   sodium chloride  flush  3 mL Intravenous Q12H   spironolactone   25 mg Oral Daily   torsemide   40 mg Oral Daily   Continuous Infusions:     LOS: 10 days    Time spent:    Sigurd Pac, MD [facility]   09/27/2023, 10:25 AM

## 2023-09-27 NOTE — TOC Transition Note (Addendum)
 Transition of Care [facility]) - Discharge Note   Patient Details  Name: Robert Daniels MRN: [id number] Date of Birth: [birth year]  Transition of Care [facility] CM/SW Contact:  Justina Delcia Czar, RN Phone Number: [phone] 09/27/2023, 3:10 PM   Clinical Narrative:     Inpatient CM contacted [employer] and states they have clinicals. Unable to submit for auth without CPAP order, states it will take 1-2 weeks before they can set up CPAP pending insurance auth. Contacted attending to make aware.  Pt completed sleep study in 08/2023. Pulmonary following for CPAP.    PCP appt scheduled for with Greig Drones NP on 10/06/2023 at 2 pm.   Contacted [employer] rep, Jermaine and they will set up CPAP this evening. Will need CPAP orders. Attending updated.     Will continue to follow for dc needs.   Final next level of care: Home/Self Care Barriers to Discharge: No Barriers Identified   Patient Goals and CMS Choice Patient states their goals for this hospitalization and ongoing recovery are:: wants to remain independent          Discharge Placement                       Discharge Plan and Services Additional resources added to the After Visit Summary for     Discharge Planning Services: CM Consult            DME Arranged: Continuous positive airway pressure (CPAP) DME Agency: [facility]                  Social Drivers of Health (SDOH) Interventions SDOH Screenings   Food Insecurity: No Food Insecurity (09/17/2023)  Housing: Low Risk  (09/17/2023)  Transportation Needs: No Transportation Needs (09/17/2023)  Utilities: Not At Risk (09/17/2023)  Alcohol Screen: Low Risk  (06/21/2023)  Depression (PHQ2-9): Low Risk  (06/21/2023)  Financial Resource Strain: Low Risk  (08/02/2023)   Received from [facility]  Recent Concern: Financial Resource Strain - Medium Risk (07/23/2023)  Physical Activity: Insufficiently Active (06/21/2023)  Social Connections: Unknown (06/10/2023)  Stress:  No Stress Concern Present (06/21/2023)  Tobacco Use: Medium Risk (09/16/2023)  Health Literacy: Adequate Health Literacy (07/23/2023)     Readmission Risk Interventions     No data to display

## 2023-09-28 ENCOUNTER — Ambulatory Visit: Payer: Self-pay | Admitting: Family

## 2023-09-28 ENCOUNTER — Telehealth: Payer: Self-pay | Admitting: *Deleted

## 2023-09-28 NOTE — Transitions of Care (Post Inpatient/ED Visit) (Signed)
   09/28/2023  Name: [name] MRN: [id number] DOB: 1985/08/25  Today's TOC FU Call Status: Today's TOC FU Call Status:: Unsuccessful Call (1st Attempt) Unsuccessful Call (1st Attempt) Date: 09/28/23  Attempted to reach the patient regarding the most recent Inpatient/ED visit.  Follow Up Plan: Additional outreach attempts will be made to reach the patient to complete the Transitions of Care (Post Inpatient/ED visit) call.   Andrea Dimes RN, BSN [place]  [facility] RN Care Manager [phone]

## 2023-09-29 ENCOUNTER — Telehealth: Payer: Self-pay | Admitting: *Deleted

## 2023-09-29 NOTE — Transitions of Care (Post Inpatient/ED Visit) (Signed)
   09/29/2023  Name: Robert Daniels MRN: [id number] DOB: 03/07/1985  Today's TOC FU Call Status: Today's TOC FU Call Status:: Unsuccessful Call (2nd Attempt) Unsuccessful Call (2nd Attempt) Date: 09/29/23  Attempted to reach the patient regarding the most recent Inpatient/ED visit.  Follow Up Plan: Additional outreach attempts will be made to reach the patient to complete the Transitions of Care (Post Inpatient/ED visit) call.   Andrea Dimes RN, BSN [place]  [facility] RN Care Manager [phone]

## 2023-09-30 ENCOUNTER — Telehealth: Payer: Self-pay | Admitting: *Deleted

## 2023-09-30 LAB — FACTOR 5 LEIDEN

## 2023-09-30 NOTE — Transitions of Care (Post Inpatient/ED Visit) (Signed)
   09/30/2023  Name: Robert Daniels MRN: [id number] DOB: 02-17-1985  Today's TOC FU Call Status: Today's TOC FU Call Status:: Unsuccessful Call (3rd Attempt) Unsuccessful Call (3rd Attempt) Date: 09/30/23  Attempted to reach the patient regarding the most recent Inpatient/ED visit.  Follow Up Plan: No further outreach attempts will be made at this time. We have been unable to contact the patient.  Andrea Dimes RN, BSN [place]  [facility] RN Care Manager [phone]

## 2023-10-01 ENCOUNTER — Telehealth (HOSPITAL_COMMUNITY): Payer: Self-pay | Admitting: Licensed Clinical Social Worker

## 2023-10-01 NOTE — Telephone Encounter (Signed)
 CSW called pt to remind of appt Monday and inquire if caregivers would be able to attend appt with him.  Unable to reach patient- left VM informing of appt and requesting return call to confirm he is coming/inquire if he will be bringing caregivers.  Will continue to follow and assist as needed  Andriette HILARIO Leech, LCSW Clinical Social Worker [facility] Desk#: [phone] Cell#: [phone]

## 2023-10-04 ENCOUNTER — Telehealth (HOSPITAL_COMMUNITY): Payer: Self-pay | Admitting: *Deleted

## 2023-10-04 ENCOUNTER — Ambulatory Visit (HOSPITAL_COMMUNITY)
Admission: RE | Admit: 2023-10-04 | Discharge: 2023-10-04 | Disposition: A | Discharge status: Home / Self Care | Source: Ambulatory Visit | Attending: Cardiology | Admitting: Cardiology

## 2023-10-04 ENCOUNTER — Other Ambulatory Visit (HOSPITAL_COMMUNITY): Payer: Self-pay

## 2023-10-04 VITALS — BP 105/75 | HR 91 | Ht 72.0 in | Wt 268.6 lb

## 2023-10-04 DIAGNOSIS — I5023 Acute on chronic systolic (congestive) heart failure: Secondary | ICD-10-CM | POA: Insufficient documentation

## 2023-10-04 DIAGNOSIS — R000 Tachycardia, unspecified: Secondary | ICD-10-CM | POA: Diagnosis not present

## 2023-10-04 DIAGNOSIS — I428 Other cardiomyopathies: Secondary | ICD-10-CM | POA: Diagnosis not present

## 2023-10-04 DIAGNOSIS — N1832 Chronic kidney disease, stage 3b: Secondary | ICD-10-CM | POA: Insufficient documentation

## 2023-10-04 DIAGNOSIS — E669 Obesity, unspecified: Secondary | ICD-10-CM | POA: Insufficient documentation

## 2023-10-04 DIAGNOSIS — Z79899 Other long term (current) drug therapy: Secondary | ICD-10-CM | POA: Insufficient documentation

## 2023-10-04 DIAGNOSIS — I5022 Chronic systolic (congestive) heart failure: Secondary | ICD-10-CM

## 2023-10-04 DIAGNOSIS — I513 Intracardiac thrombosis, not elsewhere classified: Secondary | ICD-10-CM

## 2023-10-04 DIAGNOSIS — N1831 Chronic kidney disease, stage 3a: Secondary | ICD-10-CM | POA: Diagnosis not present

## 2023-10-04 DIAGNOSIS — R0683 Snoring: Secondary | ICD-10-CM | POA: Diagnosis not present

## 2023-10-04 DIAGNOSIS — Z86718 Personal history of other venous thrombosis and embolism: Secondary | ICD-10-CM | POA: Diagnosis not present

## 2023-10-04 DIAGNOSIS — Z7901 Long term (current) use of anticoagulants: Secondary | ICD-10-CM | POA: Diagnosis not present

## 2023-10-04 LAB — PROTIME-INR
INR: 1.2 (ref 0.8–1.2)
Prothrombin Time: 15.8 s — ABNORMAL HIGH (ref 11.4–15.2)

## 2023-10-04 LAB — BASIC METABOLIC PANEL WITH GFR
Anion gap: 13 (ref 5–15)
BUN: 12 mg/dL (ref 6–20)
CO2: 25 mmol/L (ref 22–32)
Calcium: 9 mg/dL (ref 8.9–10.3)
Chloride: 101 mmol/L (ref 98–111)
Creatinine, Ser: 1.66 mg/dL — ABNORMAL HIGH (ref 0.61–1.24)
GFR, Estimated: 54 mL/min — ABNORMAL LOW (ref >60)
Glucose, Bld: 111 mg/dL — ABNORMAL HIGH (ref 70–99)
Potassium: 3.3 mmol/L — ABNORMAL LOW (ref 3.5–5.1)
Sodium: 139 mmol/L (ref 135–145)

## 2023-10-04 LAB — T4, FREE: Free T4: 1.01 ng/dL (ref 0.61–1.12)

## 2023-10-04 LAB — PSA: Prostatic Specific Antigen: 0.38 ng/mL (ref 0.00–4.00)

## 2023-10-04 LAB — DIGOXIN LEVEL: Digoxin Level: <0.6 ng/mL — ABNORMAL LOW (ref 0.8–2.0)

## 2023-10-04 MED ORDER — POTASSIUM CHLORIDE CRYS ER 20 MEQ PO TBCR
40.0000 meq | EXTENDED_RELEASE_TABLET | Freq: Every day | ORAL | 3 refills | Status: AC
  Filled 2023-10-04: qty 180, 90d supply, fill #0

## 2023-10-04 NOTE — Progress Notes (Signed)
 Patient presents to [facility] Clinic for hospital discharge follow up alone.    Pt reports he has been feeling better since discharge. He has been weighing daily at home and has maintained a steady weight. He says his activity level has improved and reports improved shortness of breath.  Patient currently undergoing VAD evaluation and spoke with Dr. Zenaida saying he wants LVAD and would like to get it done as soon as possible. Remaining labs drawn at today's visit. Patient will need dental extractions prior to VAD implant during that admission.   Patient met with Jenna Uris, LCSW to further discuss VAD implant and caregivers.  Patient will be presented at [facility] Wednesday 10/06/23 and we will call him afterward to give him decision. Pt verbalized understanding of same.  Vital Signs:  Automatic BP:  105/75 HR:  91 - sinus SPO2:  99  Weight: 268.6 lbs Discharge weight:  263.6 lbs  Patient Insructions: No change in medications. We will call you after [facility] presentation this Wednesday to discuss findings and schedule admission if warranted. Please call us  at [phone] if any questions or concerns prior to next visit.

## 2023-10-04 NOTE — Progress Notes (Addendum)
 Advanced Heart Failure/Cardiomyopathy Clinic Note    Referring provider: Dr. Blondie  History of Present Illness: Robert Daniels is a 38 y.o. male referred for management of heart failure and/or consideration of advanced heart failure therapies. His cardiac history is detailed in the problem list below. In brief,   History of Present Illness Robert Daniels is a 38 year old male (BT O, BMI 36) with non-ischemic cardiomyopathy who presents for a second opinion.  Past cardiac history: -2022: Diagnosed with non-ischemic cardiomyopathy in 2022, he initially presented with shortness of breath and an NSTEMI. Initial ejection fraction was in the 20% range, with catheterization confirmed no ischemic heart disease. 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  -2023: Right heart catheterization showed elevated pressures and cardiac MRI indicated severe left ventricular dilatation and dysfunction with an ejection fraction of 16%.  -2025: He has experienced multiple hospitalizations due to persistent ejection fraction between 20-25% and severe left ventricular dilatation. Right ventricular function is severely reduced, with a recent echocardiogram showing an ejection fraction of 15%. A CPET in July 2025 showed a maximal study with peak VO2 of 12.7 and a VE/VCO2 slope of 42, with resting PVCs during exercise and recovery.  Current medications include milrinone , digoxin , Jardiance , aldactone , and losartan . His BMI is 36. Recent right heart catheterization showed elevated pressures while on milrinone  support. He was planned for LVAD surgery at [facility] and presents to [facility] for a second opinion.  Current activity level consists of walking. The patient is taking all CV medications without a problem.   Subjective: Today, he is accompanied by his partner and his 73mo old daughter Robert Daniels. He feels ok today and feels close to his dry weight of ~268-272 lbs. He is tachycardic. Denies  palpitations. No shortness of breath recently, though he was just admitted recently earlier this month in cardiogenic shock requiring IV milrinone , this was discontinued on 9/11. RHC 9/8 with RA 6, PA 45/28 mPA 31, W 30 and CI 2.3.  Heart Failure Review of Symptoms:  Weight gain: yes Lower extremity edema: no Orthopnea: no PND: no Chest pain: no Palpitations: non Dizziness: no Near syncope or syncopal episodes: no Functional status: Blocks 1-2, Flights of Stairs 1 NYHA Functional Class: III-IV ICD discharges:none HF hospitalization:none  Problem List: -NICM, HFrEF -NSTEMI and possible myopericarditis -Recurrent HF hospitalizations -LV mural thrombus.  Surgical History: No past surgical history on file.   Medications: Current Outpatient Medications  Medication Sig Dispense Refill  . acetaminophen  (TYLENOL ) 500 MG tablet Take 500 mg by mouth    . apixaban  (ELIQUIS ) 5 mg tablet Take 5 mg by mouth 2 (two) times daily    . digoxin  (LANOXIN ) 0.125 MG tablet Take 0.0625 mg by mouth    . empagliflozin  (JARDIANCE ) 10 mg tablet Take 10 mg by mouth every morning before breakfast    . losartan  (COZAAR ) 25 MG tablet Take 12.5 mg by mouth once daily    . mexiletine (MEXITIL ) 200 MG capsule Take 200 mg by mouth 2 (two) times daily    . potassium chloride  (KLOR-CON  M20) 20 MEQ ER tablet Take 20 mEq by mouth once daily    . spironolactone  (ALDACTONE ) 25 MG tablet Take 25 mg by mouth once daily    . TORsemide  (DEMADEX ) 20 MG tablet Take 40 mg by mouth once daily     No current facility-administered medications for this visit.    Medication list reconciled with patient in clinic  today.   Allergies: Not on File  Family history No family history on file. No known family history of HF. No one else with similar symptoms.  Social History: Social History   Socioeconomic History  . Marital status: Married  Tobacco Use  . Smoking status: Never  . Smokeless tobacco: Never  Vaping Use   . Vaping status: Never Used  Substance and Sexual Activity  . Alcohol use: Yes    Alcohol/week: 1.0 - 2.0 standard drink of alcohol    Types: 1 - 2 Standard drinks or equivalent per week   Social Drivers of Health   Financial Resource Strain: Low Risk  (08/02/2023)   Received from [facility]   Overall Financial Resource Strain (CARDIA)   . How hard is it for you to pay for the very basics like food, housing, medical care, and heating?: Not very hard  Recent Concern: Financial Resource Strain - Medium Risk (07/23/2023)   Received from [facility]   Overall Financial Resource Strain (CARDIA)   . How hard is it for you to pay for the very basics like food, housing, medical care, and heating?: Somewhat hard  Food Insecurity: No Food Insecurity (09/17/2023)   Received from [facility]   Hunger Vital Sign   . Within the past 12 months, you worried that your food would run out before you got the money to buy more.: Never true   . Within the past 12 months, the food you bought just didn't last and you didn't have money to get more.: Never true  Transportation Needs: No Transportation Needs (09/17/2023)   Received from [facility] - Transportation   . In the past 12 months, has lack of transportation kept you from medical appointments or from getting medications?: No   . 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 Has 6 children Substance use: no. Prior cocaine use. None recently. Alcohol use: drinks ~4 shots on the weekend, 2 days a week. Tobacco use: used to smoke but quit 2 months ago.  Physical Exam: Vitals:   10/04/23 1650  Pulse: 108  Resp: 20  BP: 104/70  BP Location: Left forearm  Patient Position: Sitting  BP Cuff Size: Adult    Wt Readings from Last 3 Encounters:  10/04/23  (!) 121.8 kg (268 lb 9.6 oz)   Body mass index is 36.43 kg/m.  General Appearance:  Well appearing and in no acute distress during physical examination  HEENT:  Neck veins slightly above clavicle, JVP 7cm H2O  LUNGS:   Clear to auscultation with no rales or rhonchi  CARDIOVASCULAR:  Auscultation:             Tachycardic              S1: Normal in intensity  S2: Normal in intensity with normal splitting of the second sound  Gallops: None  Murmurs: holosystolic murmur noted.  ABDOMEN:   Liver enlargement: No Pulsatile liver: No Ascites: None   PERIPHERAL: Edema: none The extremities feel warm and well-perfused.  SKIN: No lower extremity rashes or ulcers  NEUROLOGIC: Alert, interactive, and appropriate, grossly moving all 4 extremities   Accessory Clinical Findings: Per Dr Cherrie Admitted 3/23 with CP and + Hs-Troponin. Echo EF 20-25%. R/LHC with normal cors, well-compensated filling  pressures, EF 25%. cMRI with LVEF 15%, RVEF 33%, findings consistent with myopericarditis. Viral panel negative. Started on GDMT and colchicine . Discharged home, weight 282 lbs.  - Echo (8/23): EF 20-25%, moderate LVH, RV low/normal.  - cMRI (3/23): EF 15% with markedly dilated LV with evidence of diffuse myopericarditis. RV 33%  R/LHC (3/23): normal cors EF 25%  RA = 3 RV = 32/10 PA = 34/20 (26) PCW = 17 Fick cardiac output/index = 6.1/2.5 PVR = 1.5 WU Ao sat = 94% PA sat = 68%, 68%  or CPET 2025:  CPX:  Exercise testing with gas exchange demonstrates a moderately (near severely) reduced peak VO2 of 12.7 ml/kg/min (43.4% of the age/gender/weight matched sedentary norms). The RER of 1.23 indicates a maximal effort. When adjusted to the patient's ideal body weight of 184.7 lb (83.8 kg) the peak VO2 is 19 ml/kg (ibw)/min (46.8% of the ibw-adjusted predicted). The VE/VCO2 slope is elevated and indicates excessive dead space ventilation. The oxygen uptake efficiency slope (OUES) is below  predicted values (53% of predicted). The VO2 at the ventilatory threshold was below normal at 33% of the predicted peak VO2. At peak exercise, the ventilation reached 63.3% of the measured MVV indicating ventilatory reserve remained. The O2pulse (a surrogate for stroke volume) initially increased with exercise until it reaches a peak of 12ml/beat, 59% predicted.    ABS Troponin: elevated (2022) BMP: thousands (2022)  RADIOLOGY Cardiac MRI: Severe LV dilatation, LVDD 8 cm, mild asymmetric remodeling, interventricular septal thickness, severe LV systolic dysfunction, EF 16%, increased native T1 signal, LGE in LV myocardium apical mid inferior and mid wall, moderate RV systolic dysfunction, AF 32% (7976) Carotid ultrasound: Normal without significant carotid disease Lower extremity ultrasound: Normal without evidence of DVTs  DIAGNOSTIC Cardiac catheterization: No ischemic heart disease, EF 25% (2022) Right heart catheterization: RAP 3, PA 34/20, mean 26, wedge 17, cardiac index 2.5 (2023) Right heart catheterization: RAP 6, PA 44/25, mean 33, wedge 24, index 1.97 (06/2023) Echocardiogram: EF 25%, grade 3 diastolic dysfunction, severely reduced RV function, dilated IVC (07/2023) CPET: Peak VO2 12.7, maximal 1.23 at RER, VE/VCO2 slope 42, resting PVCs, OUES below predicted (07/2023) Right heart catheterization: RAP 6, PA 45/28, mean 31, wedge 30, cardiac index 2.3 (09/2023) PFTs: FEV1/FVC ratio 85%, FEV1 59% of predicted  Assessment and Plan:  Assessment & Plan #Non-ischemic cardiomyopathy with severe systolic dysfunction and heart failure with reduced ejection fraction (HFrEF) #Myopericarditis history Chronic non-ischemic cardiomyopathy with severe systolic dysfunction and HFrEF since 2022. Ejection fraction 15-25%, severely dilated left ventricle, reduced right ventricular function. Reduced exercise capacity, peak VO2 12.7. Previously, not a transplant candidate due to smoking history and BMI  38.4. - Continue digoxin , Jardiance , aldactone , losartan . -Will plan on advanced therapies evaluation at [facility]. Will plan on evaluation for both transplant and VAD. Reports he remains abstinent of controlled substances. -He is amenable to VAD therapy if this is the only option and wishes to have surgery at [facility], if possible. -Considerations for transplant include: former smoker, BMI 36, and blood type O with body size anticipate prolonged wait times. -Will continue HF care with [facility] team  Disposition:  He will return to clinic in 4-6 weeks. Will plan on advanced therapies eval at [facility].  Darice Dionisio Dawes, MD Advanced Heart Failure and Transplant Team  Answers submitted by the patient for this visit: Recent Medical Symptoms (Submitted on 10/04/2023) Night sweats: No Appetite Loss: No Weight loss: No Daytime sleepiness: No visual change: No Hoarseness: No  Hemoptysis (Coughing up blood): No Tachycardia (heart racing): No leg pain: No Bowel habits change: No Melena (Black tar-like stool): No Hesitancy (Difficulty in beginning the flow of urine): No Joint pain: No Memory loss: No Syncope (Fainting or passing out): No Extremity weakness: No Paresthesias (Pins and Needles): No Loss of balance: No

## 2023-10-04 NOTE — Progress Notes (Signed)
 ADVANCED HF CLINIC NOTE  Primary Care: none HF Cardiologist: Dr. Cherrie  CC: Chronic systolic heart failure  HPI: Mr. Droke is a 38 y.o. with obesity, tobacco abuse and systolic heart failure due to NICM presenting today to follow up. Cardiac hx dates back to 3/23,  TTE EF 20-25%. R/LHC with normal cors, cMRI with LVEF 15%, RVEF 33%, findings consistent with myopericarditis. Viral panel negative.   Lost to follow up unfortunately. He was seen in [facility] ED in 9/24 with shortness of breath and chest pressure after he had stopped taking his HF medications for a week.  UDS + for cocaine and fentanyl .   He was admitted in  May 2025 at [facility] for decompensated heart failure.  Patient was aggressively diuresed with right heart cath demonstrating cardiac output of 2 L/min/m.    Patient was admitted 09/2023 for acute on chronic systolic heart failure.  He required initiation of IV milrinone  and extensive diuresis, initial mixed venous saturation 38.  Feeling significantly improved at discharge, underwent LVAD workup while inpatient.  Patient overall reports feeling well, he has had no further volume retention or lower extremity swelling.  Has been taking torsemide  40 mg daily.  He is amenable to proceeding with LVAD implantation when his workup has been completed, would just like to feel better.  He has been been very grateful to go home after the hospitalization.  Reports that his court date went well, does not appear that he has any sentencing due anytime soon.  No lightheadedness, dizziness.    Current Outpatient Medications  Medication Sig Dispense Refill   acetaminophen  (TYLENOL ) 500 MG tablet Take 500 mg by mouth every 6 (six) hours as needed for fever, headache or mild pain (pain score 1-3).     apixaban  (ELIQUIS ) 5 MG TABS tablet Take 1 tablet (5 mg total) by mouth 2 (two) times daily. 180 tablet 3   digoxin  (LANOXIN ) 0.125 MG tablet Take 1/2 tablet (0.0625 mg total) by mouth daily.  30 tablet 3   empagliflozin  (JARDIANCE ) 10 MG TABS tablet Take 1 tablet (10 mg total) by mouth daily before breakfast. 90 tablet 3   losartan  (COZAAR ) 25 MG tablet Take 0.5 tablets (12.5 mg total) by mouth daily. 30 tablet 0   mexiletine (MEXITIL ) 200 MG capsule Take 1 capsule (200 mg total) by mouth 2 (two) times daily. 60 capsule 0   spironolactone  (ALDACTONE ) 25 MG tablet Take 1 tablet (25 mg total) by mouth daily. 30 tablet 6   torsemide  (DEMADEX ) 20 MG tablet Take 2 tablets (40 mg total) by mouth daily. 60 tablet 5   potassium chloride  SA (KLOR-CON  M) 20 MEQ tablet Take 2 tablets (40 mEq total) by mouth daily. 180 tablet 3   No current facility-administered medications for this encounter.    BP 105/75   Pulse 91   Ht 6' (1.829 m)   Wt 121.8 kg (268 lb 9.6 oz)   SpO2 99%   BMI 36.43 kg/m   Wt Readings from Last 3 Encounters:  10/04/23 121.8 kg (268 lb 9.6 oz)  09/27/23 119.6 kg (263 lb 10.7 oz)  09/03/23 124.3 kg (274 lb)   PHYSICAL EXAM: Vitals:   10/04/23 1021  BP: 105/75  Pulse: 91  SpO2: 99%    GENERAL: NAD, well appearing PULM:  Normal work of breathing, CTAB CARDIAC:  JVP: flat         Normal rate with regular rhythm. No murmurs, rubs or gallops.  trace edema. Warm and  well perfused extremities. ABDOMEN: Soft, non-tender, non-distended. NEUROLOGIC: Patient is oriented x3 with no focal or lateralizing neurologic deficits.    ECG 06/11/23: sinus tachycardia, personally reviewed   Cardiac Studies Echo (8/23): EF 20-25%, moderate LVH, RV low/normal. cMRI (3/23): EF 15% with markedly dilated LV with evidence of diffuse myopericarditis. RV 33% R/LHC (3/23): normal cors EF 25% RA = 3. RV = 32/10. PA = 34/20 (26). PCW = 17 Fick cardiac output/index = 6.1/2.5. PVR = 1.5 WU. Ao sat = 94%. PA sat = 68%, 68% RHC (06/14/23): RA 6, RV 46/10, PA 44/25 with a mean of 33, PCWP 24, cardiac output 2 L/min/m. CPX: Moderate to severely reduced peak VO2 of 12.8ml/kg/min (on BB)  (43.4% of matched norms). RER 1.23, VE/VCO2 slope is elevated, O2pulse with a  peak of 46mL/beat  ASSESSMENT & PLAN:  1. Acute on chronic Systolic Heart Failure - Nonischemic cardiomyopathy. / myocarditis. - Echo (3/23): EF 25% - R/LHC (3/23): normal cors and well compensated filling pressures. - cMRI (3/23): LVEF 15% with markedly dilated LV, RVEF 33%, with evidence of diffuse myopericarditis. Viral panel (-) - Echo (8/23) EF 20-25%, moderate LVH, RV low/normal. - Echo (12/23): EF 20-25 RV mildly down  - Echo 2/25: EF<20%, RV moderately reduced, ? thrombus - NYHA Class IV on recent admission, ACC/AHA Stage D cardiomyopathy - CPX borderline severe, discussed rationale today as well as the potential need for advanced therapies in the future - Continue torsemide  40mg  daily, spironolactone  25mg  daily, jardiance  10mg  daily - LVAD workup near complete, plan for discussion at [facility] - Will need tooth extraction with admission - Suspect that he would benefit from upfront RV support given borderline creatinine, dysfunctional RV  2. LV thrombus - Swirling of contrast w/o discrete thrombus on TTE from 2/25.  - Absent on echo from 9/5 - Continue Eliquis  5 mg bid. High risk for clot for now, will continue eliquis  currently, hold prior to implant  3. Substance abuse - UDS x 2 now negative. Reports complete abstinence.  - Court date went well per patient   5. CKD II/IIIa - Continue SGLT2i - sCr stable at 1.66 - Increase potassium supplementation  6. Snoring - Concern for OSA - Eventual sleep study  7. SDOH - Now has medicaid - He has transportation - Engage HFSW for resources    I spent 45 minutes caring for this patient today including face to face time, ordering and reviewing labs, reviewing records from recent hospitalization, discussing advanced therapies, seeing the patient, documenting in the record, and arranging follow ups.   Morene JINNY Brownie, MD  4:50 PM

## 2023-10-04 NOTE — Telephone Encounter (Signed)
 Called patient today and left message per Dr. Zenaida re: lab results. Potassium low today, please increase your potassium to 40 meq (two tablets) daily. Please call us  at [phone] if any questions.

## 2023-10-04 NOTE — Progress Notes (Addendum)
 H&V Care Navigation CSW Progress Note  Clinical Social Worker met with pt during clinic visit to check in and see if he brought caregivers to visit as he was hopeful to have family present to speak with providers and learn more about his condition.  No caregivers brought today.  Pt in agreement to CSW calling girlfriend, Emmie, and Mom, Bobbette, to discuss caregiving role and confirm commitment to helping with caregiving responsibilities.  Spoke with pt girlfriend Emmie who was identified as the primary caregiver.  States she and the patient have been together for 12 years and she is motivated to help him however he needs.  Caregiver questions Please explain what you hope will be improved about your life and loved one's life as a result of receiving the LVAD? Be able to do the things he cares about- has seen a big difference in his ability to do physical activity. What is your biggest concern or fear about caregiving with an LVAD patient? Main fear is just how his life with change after getting an LVAD.   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  He lives with her at [address] in [place], [place].   Preferred method of learning? Hands on   Do you drive? yes How do you handle stressful situations? prayer Do you think you can do this? yes Is there anything that concerns about caregiving? 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   Do you provide caregiving to anyone else? no     Caregiver's current level of motivation to prepare for LVAD: fully motivated if this is what will help him and he is on board.  CSW also completed assessment with patients mom Muad Noga.  She confirms she is on board with caregiving and  could take off time to ensure he has 24 hours supervision after discharge from the hospital.  She does not drive but is able to assist in all other areas of care.  Works full time but no set hours.  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  Is on board with the patient getting surgery if this is what is best for him.  Pt mom expressed several medical questions- encouraged her to attend a future appt to get more details from the medical staff.  Andriette HILARIO Leech, LCSW Clinical Social Worker Advanced Heart Failure Clinic Desk#: [phone] Cell#: [phone]

## 2023-10-04 NOTE — Progress Notes (Signed)
 Noted

## 2023-10-06 ENCOUNTER — Encounter (INDEPENDENT_AMBULATORY_CARE_PROVIDER_SITE_OTHER): Admitting: Family

## 2023-10-06 DIAGNOSIS — I5023 Acute on chronic systolic (congestive) heart failure: Secondary | ICD-10-CM

## 2023-10-06 NOTE — Progress Notes (Signed)
 Erroneous encounter-disregard

## 2023-10-06 NOTE — Progress Notes (Signed)
 Cardiothoracic Surgery Consult   Referring Provider:  Dionisio Dawes, Darice SQUIBB*   Primary Care Physician:  Provider   Chief Complaint: HF  History of Present Illness: Robert Daniels is a 38 y.o. male with NICM referred for advanced heart failure therapies- LVAD.  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  Diagnosed with NICM in 2022 after presenting with SOB and NSTEMI.  LVEF was found to have 20% at that point.  Cath ruled out ischemic disease.  He quit working as a [employer] after initial diagnosis.  Further work up in 2023 with RHC and cMRI showed elevated pressures and LVEF 16% with severe LV dilation.  This year, he has had multiple admissions.  More recently found to have severe RV dysfunction, CPET in July showed peak VO2 12.7.  He was most recently admitted 9/4-9/15 and was started on trial of Milrinone .  Repeat RHC while on Milrinone  showed persistently elevated pressures. Milrinone  was weaned off for marginal hemodynamics (decreasing co-ox while on gtt).  At discharge, VAD (+TVr and RV support) was recommended given not current transplant candidate (smoker, drug abuse, obesity)  He presents with palpitations and hot flashes and headaches. Patient denies any chest pain. Patient denies dyspnea, fatigue, near-syncope, syncope, paroxysmal nocturnal dyspnea, and weight gain.  Coronary artery disease risk factors include: hypertension, male gender, microalbuminuria, obesity (BMI >= 30 kg/m2), and smoking/ tobacco exposure. Patient's current NYHA Functional Heart Failure Classification is: III.   Past Medical History:  Past Medical History:  Diagnosis Date  . Chronic kidney disease 10/06/2023  . HFrEF (heart failure with reduced ejection fraction) (CMS/HHS-HCC) 10/06/2023  . NICM (nonischemic cardiomyopathy) (CMS/HHS-HCC) 10/06/2023     Past Surgical History:  No past surgical history on file.   Family History:   No family  history on file.   Social History:  Social History   Socioeconomic History  . Marital status: Married  Tobacco Use  . Smoking status: Never  . Smokeless tobacco: Never  Vaping Use  . Vaping status: Never Used  Substance and Sexual Activity  . Alcohol use: Yes    Alcohol/week: 1.0 - 2.0 standard drink of alcohol    Types: 1 - 2 Standard drinks or equivalent per week   Social Drivers of Health   Financial Resource Strain: Low Risk  (08/02/2023)   Received from [facility]   Overall Financial Resource Strain (CARDIA)   . How hard is it for you to pay for the very basics like food, housing, medical care, and heating?: Not very hard  Recent Concern: Financial Resource Strain - Medium Risk (07/23/2023)   Received from [facility]   Overall Financial Resource Strain (CARDIA)   . How hard is it for you to pay for the very basics like food, housing, medical care, and heating?: Somewhat hard  Food Insecurity: No Food Insecurity (09/17/2023)   Received from [facility]   Hunger Vital Sign   . Within the past 12 months, you worried that your food would run out before you got the money to buy more.: Never true   . Within the past 12 months, the food you bought just didn't last and you didn't have money to get more.: Never true  Transportation Needs: No Transportation Needs (09/17/2023)   Received from [facility] - Transportation   . In the past 12 months, has lack of transportation kept you from medical appointments or from getting  medications?: No   . In the past 12 months, has lack of transportation kept you from meetings, work, or from getting things needed for daily living?: No     No Known Allergies   Medications: Current Outpatient Medications  Medication Sig Dispense Refill  . acetaminophen  (TYLENOL ) 500 MG tablet Take 500 mg by mouth every 8 (eight) hours as needed    . apixaban  (ELIQUIS ) 5 mg tablet Take 5 mg by mouth 2 (two) times daily    . digoxin  (LANOXIN ) 0.125  MG tablet Take 0.0625 mg by mouth once daily    . empagliflozin  (JARDIANCE ) 10 mg tablet Take 10 mg by mouth every morning before breakfast    . losartan  (COZAAR ) 25 MG tablet Take 12.5 mg by mouth once daily    . mexiletine (MEXITIL ) 200 MG capsule Take 200 mg by mouth 2 (two) times daily    . potassium chloride  (KLOR-CON  M20) 20 MEQ ER tablet Take 20 mEq by mouth once daily    . spironolactone  (ALDACTONE ) 25 MG tablet Take 25 mg by mouth once daily    . TORsemide  (DEMADEX ) 20 MG tablet Take 40 mg by mouth once daily     No current facility-administered medications for this visit.    Review of Systems:  Pertinent items in the HPI   STS Review of Systems:  Review of Systems (STS data):    Neuro:  Cerebrovascular Disease: No. Prior CVA: No. TIA: No. Carotid stenosis:  None. Prior Carotid surgery and/or Stenting: No.  Cardiovascular:  Heart Failure: Yes. Arrhythmia: No. History of Cardiac Interventions: No. Family Hx Premature CAD: No. Hypertension: Yes. Hyperlipidemia: No. Syncope: No. Pre-Syncope: No. Endocarditis: No.  Respiratory:  Chronic Lung Disease: No. Home Oxygen: No. Sleep apnea: Yes. Pneumonia history: Yes., remote Inhaler use: No.  GI:  Liver Disease:No. History of GIB: No.  Renal:  Renal Insufficiency: Yes. Dialysis: No.  Endocrine:  Immunocompromised: No. Diabetes: No.   Heme:  History of DVT: No. History of PE: No.  Refuses blood products:  No. Home antiplatelet or anticoagulation use: Yes.  Extremities:  Peripheral Artery Disease: No. History of varicose veins: No. Claudication symptoms: No.   Other:  Cancer within 5 years: No. Mediastinal Radiation: No.   Physical Exam: BP 112/76 (BP Location: Left upper arm, Patient Position: Sitting, BP Cuff Size: Adult)   Pulse (!) 121   Temp 37.2 C (98.9 F) (Oral)   Resp 20   Ht 182.9 cm (6')   Wt (!) 122.9 kg (270 lb 15.1 oz)   SpO2 100% Comment: RA  BMI 36.75 kg/m    General  appearance: alert, appears stated age, and cooperative Neurologic: Alert and oriented X 3, normal strength and tone. HEENT: Head: Normocephalic, atraumatic, without obvious abnormality. Eye: Normal external eye, conjunctiva, lids cornea, PERRL. Nose: Normal external nose, mucus membranes and septum. Pharynx: dentition normal, normal buccal mucosa. Normal pharynx. Neck: no adenopathy, no carotid bruit, no JVD, and supple, symmetrical, trachea midline Lungs: clear to auscultation bilaterally and normal chest wall Heart: S1 and S2 present, blowing holosystolic murmur present, and tachycardic Abdomen: soft, non-tender; bowel sounds normal; no masses,  no organomegaly Extremities: extremities normal, atraumatic, no cyanosis or edema, no varicose veins or vein stripping scars, capillary refill < 2 seconds  Pulses: 2+ and symmetric Skin: Skin color, texture, turgor normal. No rashes or lesions  Data:   CPX 08/12/23  Notes: Patient gave very good effort. Pulse-oximetry remained 98-100% for duration of exercise.    ECG:  Resting ECG PVCs. PVCs throughout duration of exercise and recovery. Couplet in recovery. HR increased modestly with incremental exercise reaching a peak HR below APMHR  (74% APMHR). BP response mostly appropriate.   PFT:  Pre-exercise spirometry suggest mild restrictive properties. The MVV was normal.   CPX:  Exercise testing with gas exchange demonstrates a moderately (near severely) reduced peak VO2 of 12.7 ml/kg/min (43.4% of the age/gender/weight matched sedentary norms). The RER of 1.23 indicates a maximal effort. When adjusted to the patient's ideal body weight of 184.7 lb (83.8 kg) the peak VO2 is 19 ml/kg (ibw)/min (46.8% of the ibw-adjusted predicted). The VE/VCO2 slope is elevated and indicates excessive dead space ventilation. The oxygen uptake efficiency slope (OUES) is below predicted values (53% of predicted). The VO2 at the ventilatory threshold was below normal at 33%  of the predicted peak VO2. At peak exercise, the ventilation reached 63.3% of the measured MVV indicating ventilatory reserve remained. The O2pulse (a surrogate for stroke volume) initially increased with exercise until it reaches a peak of 27ml/beat, 59% predicted.    Conclusion: Exercise testing with gas exchange demonstrates moderate (near severe) functional impairment when compared to matched sedentary norms.    Echo 09/17/23  1. Left ventricular ejection fraction, by estimation, is <20%. The left ventricle has severely decreased function. The left ventricle demonstrates global hypokinesis. The left ventricular internal cavity size was severely dilated. Left ventricular  diastolic function could not be evaluated.   2. Right ventricular systolic function mild to moderately reduced. The right ventricular size is moderately enlarged. There is mildly elevated pulmonary artery systolic pressure. The estimated right ventricular systolic pressure is 39.2 mmHg.   3. Left atrial size was mildly dilated.   4. Right atrial size was mildly dilated.   5. The mitral valve is grossly normal. Mild mitral valve regurgitation.   6. Tricuspid valve regurgitation is moderate to severe.   7. The aortic valve is tricuspid. Aortic valve regurgitation is mild. Aortic valve sclerosis is present, with no evidence of aortic valve stenosis.   8. The inferior vena cava is normal in size with greater than 50% respiratory variability, suggesting right atrial pressure of 3 mmHg.   RHC 09/20/23  On milrinone  0.25 mcg/kg/min   RA = 6  RV = 50/10  PA = 45/28 (31)  PCW = 30  Fick cardiac output/index = 5.5/2.3  PVR = 0.2 WU  Ao sat = 95%  PA sat = 57%, 61%  PAPi = 2.8    PFT 09/27/23 FEV1 pred 59% DLCO pred 60-64%  Carotid u/s & ABI Right Carotid: Velocities in the right ICA are consistent with a 1-39% stenosis.  Left Carotid: Velocities in the left ICA are consistent with a 1-39% stenosis.  Vertebrals: Bilateral  vertebral arteries demonstrate antegrade flow.   Right ABI: Resting right ankle-brachial index indicates mild right lower extremity arterial disease.  Left ABI: Resting left ankle-brachial index is within normal range.   Patient Active Problem List  Diagnosis  . NICM (nonischemic cardiomyopathy) (CMS/HHS-HCC)  . HFrEF (heart failure with reduced ejection fraction) (CMS/HHS-HCC)  . Chronic kidney disease    Assessment / Plan:  38 yo male with advanced heart failure warranting surgical intervention  We will discuss at heart conference and then contact the pt regarding recommendations.  DVT Prophylaxis: On Eliquis  for LV thrombus CAD: not indicated - no CAD Heart failure: Ace-inhibitor/ARB therapy currently taking

## 2023-10-06 NOTE — Telephone Encounter (Signed)
 Attempted to call pt regarding acceptance for VAD. Pt did not answer and VM is not set up. VAD Coordinator spoke with pt's girlfriend Emmie and requested to ask pt to return our phone call in VAD Clinic. Emmie verbalized understanding and stated she would pass on message.  Schuyler Lunger RN, BSN VAD Coordinator 24/7 Pager [phone]

## 2023-10-12 ENCOUNTER — Ambulatory Visit (HOSPITAL_COMMUNITY): Admission: RE | Admit: 2023-10-12 | Source: Ambulatory Visit

## 2023-10-12 ENCOUNTER — Encounter (HOSPITAL_COMMUNITY): Admitting: Cardiology

## 2023-10-13 ENCOUNTER — Other Ambulatory Visit (HOSPITAL_COMMUNITY): Payer: Self-pay

## 2023-10-13 NOTE — Progress Notes (Signed)
 LVAD COORDINATOR NOTE Referred for VAD education by Dr. Darice Dawes I reviewed patient history for eligibility for advanced heart failure therapy including heart failure medications, treatment and symptoms, documented EF and RHC results.  I discussed Left Ventricular Assist Device (LVAD) implantation as a potential treatment option for end stage heart failure with the patient and girlfriend.  I reviewed the pros and cons of Heartmate III  LVAD, caregiver role, survival, complications, life style changes, follow up care, discharge planning, rehabilitation, LVAD dressing supplies, and LVAD driveline management. The patient was offered an opportunity to talk with another LVAD patient and was given written materials to support the education presented. I verified that the patient has reliable phone, water, and electrical service including appropriate electrical outlets.  Advanced planning conversation initiated and information about palliative care services included in written materials. EUROMACS RHF risk: low Advance Care Planning Patient wants to move forward with best therapy for treatment of his HF as he wants to live to help raise his new daughter and have as normal a lifestyle as possible.   I spent a total of 35 minutes in both face-to-face and non-face-to-face activities, excluding procedures performed, for this visit on the date of this encounter.  Attestation Statement:   I personally performed the service, non-incident to. (WP)   LEITA CARBON, NP

## 2023-10-27 NOTE — Progress Notes (Signed)
 CICU DAILY PROGRESS NOTE    Hospital Admission Date 10/27/2023  CCU Admission Date 10/27/23  Hospital Day 1   PATIENT ID: Robert Daniels is a 38 y.o. male with PMHx of NICM (dx 2022), HFrEF, T2DM, IDA, cocaine use, NSTEMI and possible myopericarditis [facility] 3/23 with normal cors), recurrent HF hospitalizations, LV mural thrombus admitted to the Cardiac Care Unit after undergoing right heart catheterization in the setting of LVAD workup. Found to have a CI of 1.6. Recommended for direct admission for expedited LVAD evaluation, now s/p IABP placement, awaiting further LVAD work-up.    SUBJECTIVE   INTERVAL EVENTS: Night: - Changed POC BG to qACHS - SSI added - Checked strep pneumo urine Ag/legionella urine Ag - neg - MRSA nares - neg - Started Doxy 100mg  BID x5d for atypical PNA coverage - Increased hep 1900 -> 2200 - Supp 20mEq po KCl for K 3.8 - 2000 lactate 2.7 (from 1.8) - Pt declined A line. - POCUS @2300  with collapsible IVC ~2cm. Held lasix  drip as pt was net neg 5L and appeared euvolemic vs. Dry - Repeat lactate 0000 1.5 (from 2.7). Held off on afterload reduction, lactate 0400 1.3. - Increased hep 2200 -> 2500 (aPTT 34.8) - Increased hep 2500 -> 2800 (aPTT 34.3)   CICU COURSE: 10/15: RHC with CI 1.6 -> admitted to CICU for expedited LVAD  MEDICATIONS   Allergies: No Known Allergies  Infusions:  . [Held by provider] FUROsemide  (LASIX ) 500 mg in sodium chloride  0.9% 100 mL infusion Stopped (10/27/23 2307)  . heparin  (DUH) 2,800 Units/hr (10/28/23 9388)  . MILrinone  in dextrose  5% 0.125 mcg/kg/min (10/28/23 0500)    Scheduled Meds:  . cefTRIAXone   1 g Intravenous Q24H  . doxycycline  monohydrate  100 mg Oral Q12H SCH  . [Held by provider] empagliflozin   10 mg Oral Daily before breakfast  . insulin LISPRO (AdmeLOG, HumaLOG) injection  0-12 Units Subcutaneous TID CC  . [Held by provider] losartan   12.5 mg Oral Daily  . [Held by provider] mexiletine  200 mg Oral  BID     PRN Meds:acetaminophen , dextrose  50% in water, glucagon, lidocaine , lidocaine , melatonin, ondansetron , polyethylene glycol, sennosides  Current Diet Order: Active Orders  Diet   Diet 2 GM NA 2000 ML FLUID    PHYSICAL EXAM    Current Vital Signs 24h Vital Sign Ranges  T 36.7 C (98 F) Temp  Avg: 37.4 C (99.4 F)  Min: 36.7 C (98 F)  Max: 38 C (100.4 F)  BP 110/65 BP  Min: 90/64  Max: 133/92  HR 90 Pulse  Avg: 99.2  Min: 90  Max: 114  RR (!) 31 Resp  Avg: 29.5  Min: 19  Max: 49  SaO2 93 % Nasal cannula SpO2  Avg: 97.5 %  Min: 91 %  Max: 100 %  IABP MAP         24 Hour I/O Current Shift I/O  Time Ins Outs 10/15 0701 - 10/16 0700 In: 1556.7 [P.O.:984; I.V.:410.7] Out: 7205 [Urine:7205] 10/15 1901 - 10/16 0700 In: 770.2 [P.O.:480; I.V.:290.2] Out: 2675 [Urine:2675]   Last Weight: (!) 122 kg (268 lb 15.4 oz) (10/27/23 1741)   Admission Weight:(!) 121.7 kg (268 lb 4.8 oz) (10/27/23 0707)  Height: 182.9 cm (6') BMI: Body mass index is 36.48 kg/m.   Physical Exam  General: alert, well-appearing, NAD HEENT: normal dentition, MMM, EOMI, PERRL Cardiovascular: Tachycardic, IABP obstructing heart sounds. PT pulses symmetric b/l.  Pulmonary: Trace end expiratory wheeze on the  L upper lung field. Otherwise clear in apices Abdomen: normal active bowel sounds, mildly TTP in RUQ region. Neg Murphy's. Extremities: No LE edema. PT pulses symmetric. RLE cooler than LLE but was not under the blanket. Skin: no rashes Neuro: A&Ox3, grossly intact, no focal deficits, moving all extremities  Last BM  (PTA) GCS Glasgow Coma Scale Score: 15 (10/27/23 2000) RASS [place] Agitation Sedation Scale %%: 0 Alert and calm CAM Delirium Assessment: Able to assess  Lines/drains/airways (days old) Peripheral IV 10/27/23 Distal;Left Basilic;Forearm (1) Peripheral IV 10/27/23 Distal;Right Basilic;Forearm (1) Peripheral IV 10/27/23 Right Antecubital (1) IABP 9.0 Fr. 50 mL (1)  Respiratory  Details: @ICNVENT @    Last ABG: No results for input(s): TEMPCORR, FIO2ART, PHART, PCO2ART, PO2ART, HCO3ART in the last 72 hours.   Recent Labs  Lab 10/27/23 1428 10/27/23 2008 10/28/23 0436  NA 132* 134* 133*  K 3.8 3.8 3.8  CO2 24 23 23   BUN 20 20 21*  CREATININE 1.4* 1.5* 1.3  GLUCOSE 107 108 101  CALCIUM 8.2* 8.2* 8.1*  MG 2.1 2.1 2.0   Recent Labs  Lab 10/27/23 0946 10/28/23 0436  ALT 30 40  AST 43* 57*  ALKPHOS 60 68  TBILI 2.9* 1.9*  ALB 2.5* 2.5*     Recent Labs  Lab 10/27/23 0946 10/28/23 0436  WBC 13.4* 11.8*  HGB 9.4* 9.9*  HCT 28.9* 30.2*  PLT 464* 489*   Recent Labs  Lab 10/27/23 0946 10/27/23 1210 10/27/23 1852 10/27/23 2355 10/28/23 0436  APTT 31.2   < > 32.9 34.8 34.3  INR 2.3*  --   --   --  1.8*   < > = values in this interval not displayed.     No results for input(s): HSTNTSYMP, HSTNT, HSTNTINTERP, CKMB, CK in the last 168 hours.  Recent Labs  Lab 10/27/23 1210  LDLCALC 29  HDL 16  TRIG 47    Recent Labs  Lab 10/27/23 1210  TSH 3.81     RHC (10/27/2023) RA 20 RV 69/20 PA 69/47 (mean 53) PCWP 45 CI 1.6 PVR 2.1 WU (vascular access via Left brachial vein 28F sheath Disposition: Removed in lab, manual compression applied. Right Femoral artery 76F sheath Disposition: Exchanged out. Right Femoral artery 8.28F sheath Disposition: Left in place, site sutured with sterile dressing.)  Cultures/microbiology: 10/27/23: Bcx x2 - NGTD 10/27/23: eRVP neg  Imaging: 10/27/23: CXR FINDINGS:   Intra-aortic balloon pump marker near the aortic arch, approximately 3cm below the superior aortic arch. Enlarged cardiomediastinal silhouette. Diffuse interstitial opacities with right focal consolidation of right lung base, consistent with edema vs infection. No evidence of large pleural effusions or pneumothorax.  IMPRESSION:   1.  Intra-aortic balloon pump marker near the aortic arch, approximately 3cm  below the superior aortic arch. 2.  Diffuse interstitial opacities, consistent with edema, with focal consolidation in right lung base concerning for infection or aspiration.    CARDIAC DATA      ASSESSMENT AND PLAN    Robert Daniels is a 38 y.o. male  with PMHx of NICM (dx 2022), HFrEF, T2DM, IDA, cocaine use, NSTEMI and possible myopericarditis [facility] 3/23 with normal cors), recurrent HF hospitalizations, LV mural thrombus admitted to the Cardiac Care Unit after undergoing right heart catheterization in the setting of LVAD workup. Found to have a CI of 1.6. Recommended for direct admission for expedited LVAD evaluation, now s/p IABP placement, awaiting further LVAD work-up.  ICU indications: Expedited LVAD placement  CARDIOVASCULAR #Cardiogenic Shock #Hx of  NICM (2022) with severe systolic  #HFrEF (EF<20%), A/P Class IV #S/P IABP placement Hx of multiple hospitalizations for HF exacerbation, last hospitalized at [facility] 9/4-9/15, was diuresed -17.5L and initiated LVAD workup. Was planned for LVAD surgery at [facility] but elected for 2nd opinion at [facility]. Presented 10/15 for RHC in setting of LVAD workup, CI found to be 1.6 and patient was directly admitted and underwent IABP placement. RHC w/ PCWP 45, MvO2 28.1%, PA (53). Most recent Echo 09/17/2023  with LVEF <20%, RVSP 39. Previous MRI with no infiltrative disease. -S/P 80mg  IV lasix , now on Lasix  gtt (20mg /hr) with diuresis goal -3 to -4 L. -Advanced Heart Failure consulted for LVAD workup. -Holding home losartan , mexiletine, empag, torsemide , digoxin , spironolactone  -Starting milrinone  @0 .125 mcg/kg/min -Consider nitro gtt for increasing lactate, trending q6h -Possible Swan tomorrow if blood cultures negative  -Repeat Echo ordered Hgb A1c 6.5%, Lipid panel wnl, TSH 3.81, T4 1.54. Pro-BNP pending.   #LV Mural Thrombus On apixaban  at home.  -Continue Hep gtt PTT goal (60-90) - aPTT q6h  RESPIRATORY #Hypoxia #Pulmonary  Edema #C/f Pneumonia On room air at home, noted to be hypoxic on admission for RHC, placed on 2L [place]. CXR c/f RLL pneumonia, and with evidence of pulmonary edema. -On 2L [place] -Started Lasix  gtt, diuresis goal -3-4L.   #OSA Has been followed by Pulmonary, had sleep study showing severe sleep apnea, outpatient provider Dr. Neysa suggested auto CPAP. -CPAP consult placed.  RENAL #CKD Stage II Cr 1.5 on admission, consistent with baseline. -Diuresis per above -Trending BMP q12.  INFECTIOUS DISEASES #Fever #Leukocytosis #C/f Pneumonia Patient noted to have temperature to 100.3 on admission, denied fevers, chills or other infectious symptoms. WBC elevated at 13.4. CXR concerning for RLL pneumonia.  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 Etiology likely secondary to congestive hepatopathy due to heart failure. -Trend HFP and INR daily.  NEUROLOGICAL #Previous Cocaine Use UDS negative on admission.  HEMATOLOGICAL #Iron deficiency anemia Hgb on admission of 9.4, per chart review, previous levels have been in the 11-12s. No signs of bleeding on exam. LDH/haptoglobin both elevated so lower c/f hemolysis. Iron studies with iron 24, TIBC 245, transferrin sat 10%, ferritin 323. Total iron deficit by Ganzoni ~1300mg .  - Holding iron supp iso possible infection  ENDOCRINE #T2DM A1C 10/27/23 6.5%, BG 144.  - BG checks qACHS - SSI  MUSCULOSKELETAL #Deconditioning -PT/OT consulted  Code status: Full Code  Feeding - 2L fluid restriction, low Na diet Analgesia - Tylernol prn Sedation - None Thromboembolic ppx - hep gtt Head-of-bed - No iso IABP Ulcer ppx - NI Glucose control - qACHS checks, SSI   PT/OT/Early mobility ordered: yes   Need for Invasive lines reviewed (e.g., CVL, Foley, a-line): IABP in place -  10/15.  Procedure(s) done past 24 hours: 1. RHC 2. IABP placement  CVC duration? NI Intubation days? NI   Day:    Night:  Signe Remington, MD, PhD Internal Medicine Resident, PGY-1  CCU Attending   81 M with NICM 2022, HFrEF most recent EF < 20% mod enlarged RV RVSP est 38, myocarditis, LV thrombus on apix, smoker (quit 5 months ago?), cocaine (but he says no), HF hospitalizations in July and in September this year at [facility], and was being evaluated for LVAD due to symptoms, had RHC today.  IABP placed.  ? Pneumonia. proBNP 10K.  CVP 20, PA 69/47 53, W 45, CI 1.6.  Echo 15%, RV dysfunction.   Milrinone  0.125.  Had 7.2 L urine yesterday!   T 97.8 BP 100 systolic, HR 100.   CXR still mild edema   Lactate 2.7 last night, now 1.3. Na 130, K 4.9, B 21 cr 1.3. hbg 9.9 WBC 12  INR 1.8 improved.    A/P class IV HF, low EF, pulm HTN/ RV dysfunction.  Diuresing.  Now with IABP.  Plan w/u for LVAD and ? RVAD. ? RH cath tomorrow or Monday.    Critical Care Attestation:   I spent 45 minutes performing critical care.     The patient required critical care services due to shock and the threat of imminent deterioration of this/these condition(s).     Critical care time reflects my personal work performing the following specific actions at the patient's bedside with the multidisciplinary team: management of mechanical circulatory support device.   See progress note for additional details   Additional activities performed during the critical care time included: [x]   Review of overnight and recent events [x]   Review of medications, allergies, and vital signs  [x]   Serial data review [x]   Ordering, interpreting, and reviewing diagnostic studies/lab tests [x]   Clinical examination [x]   High complexity medical decision-making [x]   Care coordination and discussion with family members about critical care services and decisions [x]   Communication and coordination with consulting  services and multi-disciplinary team focused solely on the patient This time also includes time spent for documentation. This time does not include time spent in performing any separately billed procedures.     Attestation Statement:    I personally saw and evaluated the patient, and participated in the management and treatment plan as documented in the resident/fellow note.   Lonni Andes, MD

## 2023-10-28 NOTE — Consults (Signed)
 [facility] Pre-LVAD Initial Consult Note   Date of Consult:  10/28/2023 Service Requesting Consult: Cardiology Referring Provider: Atanacio Bruckner, MD Reason for Consultation: Complex Medical Decision Making/Goals of Care Primary Diagnosis:  Non-ischemic cardiomyopathy Outpatient Palliative Care Provider: Not established with [facility] Consult with palliative care team at [facility]  SUBJECTIVE:  Chief Complaint: shortness of breath  History of Present Illness: Robert Daniels is a 38 y.o. male with a history of non-ischemic cardiomyopathy as below being seen today as part of initial evaluation prior to LVAD implantation while hospitalized. We were asked to participate in medical decision making/advance care planning as part of the LVAD evaluation process. PMH of MI, LV thrombus, cocaine use, CI 1.6, admitted for expedited LVAD evaluation and IABP placement. Previously seen at [facility] and was initiated on milrinone  with plan for LVAD but came to [facility] for additional evaluation.  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  Complications: Robert Daniels has experienced the following complications: shortness of breath, difficulty sleeping and going up stairs. Poor appetite  History obtained from review of primary team notes in EMR, discussion with primary team, primary RN and, if appropriate, interview with Robert Daniels. Records reviewed and summarized above.   Review of Systems: All other systems asked and  negative except as noted in HPI.  Denies pain, no BM in 4 days. Orthopnea with has made sleep difficult, short of breath on exertion such as walking or stair climbing  Patient Active Problem List  Diagnosis  . NICM (nonischemic cardiomyopathy) (CMS/HHS-HCC)  . HFrEF (heart failure with reduced ejection fraction) (CMS/HHS-HCC)  . Chronic kidney disease   Past Medical History:  Diagnosis Date  . Chronic kidney disease 10/06/2023  . HFrEF (heart failure with reduced ejection fraction) (CMS/HHS-HCC) 10/06/2023  . NICM (nonischemic cardiomyopathy) (CMS/HHS-HCC) 10/06/2023   No past surgical history on file. No family history on file.  Social History: He  reports that he has never smoked. He has never used smokeless tobacco. He reports current alcohol use of about 1.0 - 2.0 standard drink of alcohol per week.  Lives with girlfriend in [place]. Has 7 children 21 years and under. 73 yr old has autism and would not be able to participate in medical decision making.  Religious & Spiritual Needs: did not discuss  Current Code Status Order: Full Code  Clarification: Code status was not addressed during this encounter. Documented Advance Directives:    <no information>  Trusted Decision Maker(s):  Extended Emergency Contact Information Primary Emergency Contact: [facility] Home Phone: [phone] Relation: Parent Secondary Emergency Contact: castillo,ciara Mobile Phone: [phone] Relation: Caregiver  No Known Allergies  Medications:  Prior to Admission Medications  Prescriptions Last Dose Taking?  TORsemide  (DEMADEX ) 20 MG tablet 10/25/2023 Yes  Sig: Take 40 mg by mouth once daily  acetaminophen  (TYLENOL ) 500 MG tablet 10/26/2023 Yes  Sig: Take 500 mg by mouth every 8 (eight) hours as needed  apixaban  (ELIQUIS ) 5 mg tablet 10/25/2023 Yes  Sig: Take 5 mg by mouth 2 (two) times daily  digoxin  (LANOXIN ) 0.125 MG tablet 10/26/2023 Yes  Sig: Take  0.0625 mg by mouth once daily   empagliflozin  (JARDIANCE ) 10 mg tablet 10/26/2023 Yes  Sig: Take 10 mg by mouth every morning before breakfast  losartan  (COZAAR ) 25 MG tablet 10/26/2023 Yes  Sig: Take 12.5 mg by mouth once daily  mexiletine (MEXITIL ) 200 MG capsule 10/26/2023 Yes  Sig: Take 200 mg by mouth 2 (two) times daily  potassium chloride  (KLOR-CON  M20) 20 MEQ ER tablet 10/26/2023 Yes  Sig: Take 20 mEq by mouth once daily  spironolactone  (ALDACTONE ) 25 MG tablet 10/26/2023 Yes  Sig: Take 25 mg by mouth once daily    Facility-Administered Medications: None   Scheduled Meds:  . cefTRIAXone   1 g Intravenous Q24H  . doxycycline  monohydrate  100 mg Oral Q12H SCH  . [Held by provider] empagliflozin   10 mg Oral Daily before breakfast  . insulin LISPRO (AdmeLOG, HumaLOG) injection  0-12 Units Subcutaneous TID CC  . [Held by provider] losartan   12.5 mg Oral Daily  . [Held by provider] mexiletine  200 mg Oral BID   Continuous Infusions:  . [Held by provider] FUROsemide  (LASIX ) 500 mg in sodium chloride  0.9% 100 mL infusion Stopped (10/27/23 2307)  . heparin  (DUH) 2,800 Units/hr (10/28/23 0900)  . MILrinone  in dextrose  5% 0.125 mcg/kg/min (10/28/23 0900)   PRN Meds: acetaminophen , dextrose  50% in water, glucagon, lidocaine , lidocaine , melatonin, ondansetron , polyethylene glycol, sennosides   OBJECTIVE: Temp:  [36.6 C (97.8 F)-38 C (100.4 F)] 36.6 C (97.8 F) Heart Rate:  [90-104] 100 Resp:  [19-49] 23 BP: (90-130)/(55-97) 94/65 FiO2 (%):  [21 %] 21 %  Temp (24hrs), Avg:37.2 C (98.9 F), Min:36.6 C (97.8 F), Max:38 C (100.4 F)  Weight: (!) 122 kg (268 lb 15.4 oz)  Physical Exam: General: No acute distress. Alert, actively engaged in conversation Eyes: Sclerae anicteric. HENT: Moist mucous membranes. CV: Regular rate and rhythm. Respiratory: No use of accessory muscles. Abdomen: Non-distended; bowel sounds present. MSK: normal muscle tone and bulk Skin: No rash, warm without cyanosis; no  widespread bruising. Neuro: Nonfocal, moves all 4 extremities. Psych: Mood consistent with medical situation.  Last BM Date:  (PTA)   Labs:   Recent Labs  Lab 10/27/23 0946 10/28/23 0436  WBC 13.4* 11.8*  HGB 9.4* 9.9*  HCT 28.9* 30.2*  PLT 464* 489*  MCV 83 82    Recent Labs  Lab 10/27/23 1428 10/27/23 2008 10/28/23 0436  NA 132* 134* 133*  K 3.8 3.8 3.8  CL 96* 93* 96*  CO2 24 23 23   BUN 20 20 21*  CREATININE 1.4* 1.5* 1.3  GLUCOSE 107 108 101   Latest GFR by Cockcroft Gault (not valid in AKI or ESRD) Estimated Creatinine Clearance: 104 mL/min (based on SCr of 1.3 mg/dL). Recent Labs  Lab 10/27/23 0946 10/28/23 0436  AST 43* 57*  ALT 30 40  ALKPHOS 60 68  TBILI 2.9* 1.9*  CONJBILI  --  0.60  ALB 2.5* 2.5*  TOTALPROTEIN 7.4 7.5   Lab Results  Component Value Date   ALB 2.5 (L) 10/28/2023   Recent Labs  Lab 10/27/23 0946 10/27/23 1210 10/28/23 0436  APTT 31.2   < > 34.3  INR 2.3*  --  1.8*   < > = values in this interval not displayed.    Recent Labs  Lab 10/27/23 0946 10/27/23 1428  PROBNP 12,559* 10,532*    ECG: (personally reviewed) - 10/27/23: QTc 495   Imaging: - CXR 10/28/23   Findings/Impression: 1.  IABP proximal marker projecting 3.8 cm inferior to  the aortic arch, which is in part due to differences in technique. 2.  Slightly improved right basilar alveolar opacities which may represent aspiration or atelectasis. Diffuse interstitial opacities, likely interstitial edema. 3.  Small right pleural effusion. No pneumothorax. 4.  Unchanged enlarged cardiac contours.   IMPRESSION:  Robert Daniels is a 38 y.o. male with a history of NICM being seen today as part of initial evaluation prior to LVAD implantation while hospitalized. We were asked to participate in medical decision making/advance care planning as part of the LVAD evaluation process.   RECOMMENDATIONS:  # Symptom management - Advised to reach out to LVAD team  regarding troublesome symptoms that regularly interfere with daily life and to request a palliative care referral if they feel that their symptoms are worsening or failing to improve, are negatively impacting quality of life   Advance Care Planning # Advance care planning A separately identifiable advance care planning discussion was performed 10/28/2023. Discussion was held with Robert Daniels and primary caregiver. Permission to perform ACP services was obtained and patient/family voluntarily engaged in conversation.   Health Care Surrogate Patient stated that in the event that he could not speak for or make medical decisions for himself, he  would like his parents  to make medical decisions for him.   Patient reports they have not completed, signed and notarized [place] HCPOA paperwork. Importance of completing this paperwork reviewed. The need for notarization and two witnesses in order for it to have legal standing in the state of [place]  was reviewed. The hierarchy for determining who decision making will legally fall to in [place] was reviewed. He was not currently interested in completing HCPOA or AD  Illness Understanding & Goals Robert Daniels shares that he is hoping to have LVAD placement next week. He states he received a lot of education about LVAD from [facility] including seeing a pump and meeting with an LVAD recipient.   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 he identifies the following sources of strength and support: girlfriend, parents and other family.  He was unable to state/provide understanding of risks and complications of LVAD. Briefly discussed risk of bleeding, stroke and infection. He expressed awareness that he will need close monitoring, dressing changes and anticoagulation. Encouraged him to review risks and benefits with [facility] LVAD team. Says his girlfriend will be VAD  caregiver.   Unacceptable states were not discussed during this visit.   The value of symptom management (both prior to implantation and if implantation is not pursued/or not an option) in order to maximize quality of life and function was reviewed, as was the role of palliative care which is often underutilized by people who have cardiac disease. They were encouraged to reach out to palliative care at any time with any questions, concerns, worries about the future, or for assistance with planning, symptom management, care coordination or support.   I spent 16 minutes providing advance care planning services to the patient/family today, separate from today's symptom management visit.    # NICM/HFrEF, currently undergoing LVAD evaluation  # Constipation -if no BM today, please use PRN laxative # Palliative care, Z51.5  Recommendations discussed with Norleen Epstein from the CICU team.  Thank you for the opportunity to participate in the care of Robert Daniels.  The Palliative Care team will sign off at this time. Please contact the[facility]  Palliative Care (General) consult service functional pager at [phone] if we can be of additional assistance.  PALLIATIVE CARE BILLING: TIME & MEDICAL DECISION MAKING, I spent a total of 75 minutes in both face-to-face and non-face-to-face activities, excluding procedures performed, for this visit on the date of this encounter. Delon DEL. Deanna LORENZA HIRSCHFELD, [facility], [facility] Pager [phone]

## 2023-10-28 NOTE — Progress Notes (Signed)
 STS RISK FACTORS AND PREOP CARDIAC STATUS:  Family History Of Premature Coronary Artery Disease:  No Diabetes:  Yes Diabetes Control:  None Renal Insufficiency:  Yes Dialysis:  No Hypertension:  Yes Endocarditis:  No Tobacco Use:  Never smoker Chronic Lung Disease:  No Pulmonary Function Test Done:  No Room Air ABG Performed:  No Home Oxygen:  No Inhaled Medication or Oral Bronchodilator:  No Sleep Apnea:  Yes Pneumonia:  Remote Illicit Drug Use with One Year:  No Alcohol Use:  2-7 drinks/week Liver DIsease:  No Liver Cirrhosis:  No Immunocompromised:  No Mediastinal Radiation:  No Cancer Within 5 Years:  No Peripheral Artery Disease:  No Unresponsive State:  No Syncope:  No Pre Syncope:  No Cerebrovascular Disease:  No Prior Myocardial Infarction:  No Primary Coronary Symptom for Surgery:  No Coronary Symptoms Heart Failure:  Yes Heart Failure Timing:  Both Heart failure type:  Both NYHA Class:  Class IV Cardiogenic Shock:  Yes, At the time of the procedure Resuscitation:  No Cardiac Arrhythmia:  No

## 2023-10-28 NOTE — Consults (Addendum)
 TRANSPLANT CLINICAL SOCIAL WORK NOTE  Screen: Ongoing Mental Status: A&O x 4 Mood:  euthymic Affect: appropriate Thought Process: logical,goal-directed General Appearance: age appropriate and well groomed Attitude towards interviewer: cooperative and friendly  Intervention:  Social worker , Caregiver role/responsibilities , Brief Check-in , and Followup to Initial Eval   Outcome:  Receptive, asking appropriate questions, Verbalized understanding of information, and Fully engaged in discussion   DSW met with pt at bedside as a followup to listing conference regarding identified SW initial eval risks and recommendations. Pt's cell phone number is:  [phone] Caregiver Plan DSW established rapport with pt and reviewed caregiver plan.  Pt's caregiver plan continues to include the following prioritized list:  Golda Furnace, significant other, [phone] Grover Robinson, Mother, [phone] Theodoro Ellen, [place], [phone]  Pt's grandmother, Herbie Lehrmann [phone]), grandmother, is not physically able to provide more than supervision and be an emergency contact person.   Pt advised that his father also resides in [place] and has worked on his job for 11 years (subsequently he is likely eligible for [employer]).    Pt's father, Othar Curto, can be reached at [phone].  NOK/Decision Maker Pt states that he is not legally married. Pt advised that he does have an 38 yo son.  However, pt states that this son is not physically/telephonically accessible due to current circumstances. Subsequently, the available [facility] for pt are is parents.  Pt is states that he is comfortable with his parents making decisions for him in the event he is unable to make decisions for himself.  Legal Concern Pt advised that he has an attorney and will be placing DSW in contact with his attorney to provide verification of pt's illness/hospitalization as necessary.  Pt states that he and his  legal team are in the beginning stages of all court proceedings and all is planned for delay until pt is physically better and able to participate.  Pt advised that there currently is no threat of prison time and it is too early to determine the outcome of his case.  DSW anticipating communication from pt regarding attorney contact and consents to disclose information.   Financial Concerns Pt advised that finances are stable. However, he and his significant other are budgeting and managing accordingly. Pt states that all utilities are paid regularly and pt advised that he is not at risk for any cut offs.  Pt requested parking pass assistance and meal pass assistance. DSW will provide 3 parking passes and meal passes today.  Substance Use & History Pt states that he has not consumed alcohol or cigarettes in at least the last 5 months.  Pt advised that he realized the risk to his declining health and decided it was best to change his lifestyle to promote health and wellness needs.  Pt advised that he does not use illicit substances.  However, he was exposed to them due to occupational hazard.  Pt states that he mishandled the substance he previously tested positive for and learned he has to use gloves in his handling of certain substances, as they can get in his blood stream as a result of mishandling. However, pt advised that he has discontinued work in his previous profession and this is not a current risk.  Organ Failure Caused by: Pt advised that he believes his organ failure to have been caused by his contracting Covid and then pneumonia 3 months later.  Pt states that he is otherwise unsure of the genesis and cause of his organ failure.  LVAD Education: Pt appears to have an adequate baseline understanding of the procedure.  Pt advised that he was able to meet with a pt who has an LVAD while at [facility].  Pt is highly motivated to proceed with the LVAD procedure.    DSW reported to  bedside and left a Release of Informaiton form for pt's completion (to communicate with his attorney).  DSW also provided 3 parking and meal passes.  CSW recommends to proceed with procedure and continue to monitor risk factors.  DSW to follow until procedure as needed.  Bernarda HERO. Gretel, DSW, CCM, LCSWA [facility] Advanced Heart Failure Therapies Work Cell:  [phone] Pager: [phone] Fax:  [phone]

## 2023-10-28 NOTE — Progress Notes (Signed)
 Swan-Ganz Catheter Placement Date: 10/28/2023 Indication: Hemodynamic monitoring Fellow: Cosiano Attending: Atanacio  A time-out was completed verifying correct patient, procedure, site, positioning, and special equipment if applicable. The patient was placed in a dependent position appropriate for central line placement based on the vein already cannulated with a Cordis catheter. The patient's right neck was prepped and draped in sterile fashion. A triple lumen Swan-Ganz catheter was brought onto the field and each line flushed with sterile saline. The catheter was introduced into the Cordis catheter to a distance of ~15cm. The balloon was then inflated and the catheter was advanced through the right ventricle and into the pulmonary artery until a wedge position pressure tracing was obtained. The balloon was then deflated and verification of return of a pulmonary artery pressure tracing made. During the floating procedure to position the catheter the position of the catheter tip was determined by continuous pressure monitoring and fluoroscopic guidance. The catheter was locked to the Cordis with the tip inserted to a distance of 52 cm and a sterile dressing applied.  Estimated Blood Loss: 0ml The patient tolerated the procedure well and there were no complications. Ozell Bushman, MD Cardiology Fellow

## 2023-10-28 NOTE — Progress Notes (Signed)
 Consent Process Note  IRB [id number]:  Database/Repository for Cardiac Tissue and Related Specimens  PI:  Carmelo A. Milano, MD  The Subject has signed the consent form for the above referenced study.  No study procedures were performed prior to subject's ICF signature.  A signed and dated copy of the consent form was given to the subject and the original was placed in the study file.  The subject verbalized an understanding of the study and all questions were answered to the subject's satisfaction. ALISSA NATALIE BINFORD, PA

## 2023-10-28 NOTE — Progress Notes (Signed)
 VAD CONSULT FOLLOW-UP NOTE   Admission date: 10/27/2023 Attending of record: Atanacio Bruckner, MD Date: 10/28/2023 Consult Fellow: [place] Consult Attending: Fairy Doles, MD  Mr. Wakeley is a 38 y.o. 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   Interval Events   - Net neg 5.6l overnight on lasix  gtt - Discussed at multidisciplinary conference this morning - Overall, the patient feels better after aggressive diuresis  - No further fever, WBC count decreased to 11.8, no cough   Physical Exam    Current Vital Signs 24h Vital Sign Ranges  T 36.6 C (97.8 F) Temp  Avg: 36.9 C (98.4 F)  Min: 36.6 C (97.8 F)  Max: 37.3 C (99.2 F)  BP 101/84 BP  Min: 90/64  Max: 130/82  HR 106 Pulse  Avg: 100.9  Min: 90  Max: 107  RR 18 Resp  Avg: 29.4  Min: 18  Max: 49  SaO2 99 % Nasal cannula SpO2  Avg: 98 %  Min: 91 %  Max: 100 %   Height: 182.9 cm (6') Last Weight: (!) 122 kg (268 lb 15.4 oz) (10/27/23 1741) Admit Weight: (!) 121.7 kg (268 lb 4.8 oz) (10/27/23 0707) AFP:Anib mass index is 36.48 kg/m. ADJ:Anib surface area is 2.49 meters squared.  Intake/Output Summary (Last 24 hours) at 10/28/2023 1709 Last data filed at 10/28/2023 1700 Gross per 24 hour  Intake 2267.82 ml  Output 8125 ml  Net -5857.18 ml   IAPB @ 1:1  Gen: No acute distress, lying in bed comfortably at an angle with oxygen HEENT: EOMI, MMM Neck: Elevated JVD CV: RRR, normal S1 and S2, no m/r/g Pulm: Breathing comfortably, crackles at bases Abd: Soft, NTND Ext: cool to touch, BLE edema Skin: Warm and dry, no rashes Neuro: Awake and alert, grossly oriented, moving all extremities spontaneously Psych: Mood and affect appropriate  Pertinent Labs   Lab Results  Component Value  Date   WBC 11.8 (H) 10/28/2023   HGB 9.9 (L) 10/28/2023   HCT 30.2 (L) 10/28/2023   MCV 82 10/28/2023   PLT 489 (H) 10/28/2023    Lab Results  Component Value Date   NA 135 10/28/2023   K 4.0 10/28/2023   CL 96 (L) 10/28/2023   CO2 24 10/28/2023   BUN 21 (H) 10/28/2023   CREATININE 1.2 10/28/2023   CALCIUM 8.4 (L) 10/28/2023    Lab Results  Component Value Date   AST 57 (H) 10/28/2023   ALT 40 10/28/2023   ALKPHOS 68 10/28/2023   TBILI 1.9 (H) 10/28/2023   TOTALPROTEIN 7.5 10/28/2023   ALB 2.5 (L) 10/28/2023    Lab Results  Component Value Date   INR 1.8 (H) 10/28/2023    No results found for: FK506  Additional Data   EKG Sinus tach 101, PR 160, qrsd 90, Qt/Qtc 382/495, prolonged QT, Twi V4-V6, enlarged atrium    Telemetry ST with PVCs   TTE LVIDd 7.7 cm, LVIDs 6.9, EF <15%, severe right ventricular systolic dysfunction CONCLUSION ------------------------------------------------------------------------------- SEVERE LEFT VENTRICULAR SYSTOLIC DYSFUNCTION WITH NO LVH, LV SIZE IS SEVERELY ENLARGED ESTIMATED EF: <15% DIASTOLIC FUNCTION CAN'T BE DETERMINED SEVERE RIGHT VENTRICULAR SYSTOLIC DYSFUNCTION VALVULAR REGURGITATION: No AR, TRIVIAL MR, TRIVIAL PR, MODERATE TR  ESTIMATED RVSP: 47 mmHg (ABNORMAL) NO VALVULAR STENOSIS                                                                        ------------------------------------------------------------------------------------------ PATIENT IN TACHYCARDIA THROUGHOUT EXAM                                                    SEVERE LV AND RV DYSFUNCTION                                                                NO PRIOR ECHO FOR COMPARISON   Imaging CXR IMPRESSION: 1.  Intra-aortic balloon pump marker near the aortic arch, approximately 3cm below the superior aortic arch. 2.  Diffuse interstitial opacities, consistent with edema, with focal consolidation in right lung base  concerning for infection or aspiration.   Right Heart Cath (10/27/2023) Impressions:   Note: on presentation, T 100.2 with HR 110s. Normotensive. Tachypneic and unable to lay fully flat.  Left brachial  2L No drips RA 20 mmHg  PA 69/47 (53) mmHg PCWP 45 mmHg MvO2 28.1% SaO2 93.1% Estimated Fick CO/CI 3.9/1.6 L/min/m2 Markedly elevated filling pressures with severely reduced index by Fick. Findings reviewed with referring Dr. Dionisio, who recommended IABP and direct admission for expedited LVAD evaluation.    Proceeded with IABP placement. Ultrasound-guided R CFA access with micropuncture technique. Upsized to 8.5 Fr IABP sheath and placed 50 cc IABP with excellent augmentation.   Recommendations:   - Further evaluation and treatment as per advanced HF team.  - Strict bed rest with IABP in place.  - Post-procedure CXR.  - Left brachial venous and right femoral arterial access precautions.  - Transferred to CICU in stable condition. Report given at bedside to ICU team. Findings discussed with patient, girlfriend, and girlfriend's mother.      RHC (OSH) 09/20/23 Findings:   On milrinone  0.25 mcg/kg/min   RA = 6  RV = 50/10  PA = 45/28 (31)  PCW = 30  Fick cardiac output/index = 5.5/2.3  PVR = 0.2 WU  Ao sat = 95%  PA sat = 57%, 61%  PAPi = 2.8   Assessment:  1. Persistent volume overload  2. Mild to moderately decreased CO on milrinone  support  3. Moderate RV dysfunction   Plan/Discussion:   Continue milrinone  and diuresis. Given RV numbers may be LVAD candidate  with milrinone  support.    CPX (OSH) 08/12/2023 Interpretation   Notes: Patient gave very good effort. Pulse-oximetry remained 98-100% for duration of exercise.    ECG:  Resting ECG PVCs. PVCs throughout duration of exercise and recovery. Couplet in recovery. HR increased modestly with incremental exercise reaching a peak HR below APMHR  (74% APMHR). BP response mostly appropriate.   PFT:  Pre-exercise  spirometry suggest mild restrictive properties. The MVV was normal.   CPX:  Exercise testing with gas exchange demonstrates a moderately (near severely) reduced peak VO2 of 12.7 ml/kg/min (43.4% of the age/gender/weight matched sedentary norms). The RER of 1.23 indicates a maximal effort. When adjusted to the patient's ideal body weight of 184.7 lb (83.8 kg) the peak VO2 is 19 ml/kg (ibw)/min (46.8% of the ibw-adjusted predicted). The VE/VCO2 slope is elevated and indicates excessive dead space ventilation. The oxygen uptake efficiency slope (OUES) is below predicted values (53% of predicted). The VO2 at the ventilatory threshold was below normal at 33% of the predicted peak VO2. At peak exercise, the ventilation reached 63.3% of the measured MVV indicating ventilatory reserve remained. The O2pulse (a surrogate for stroke volume) initially increased with exercise until it reaches a peak of 46ml/beat, 59% predicted.    Conclusion: Exercise testing with gas exchange demonstrates moderate (near severe) functional impairment when compared to matched sedentary norms.    Cardiac MRI Morphology with and without Contrast (OSH) 03/20/2021  FINDINGS:  1. Severe left ventricular dilation, with LVEDD 80 mm, and LVEDVi  170 mL/m2.   Mild asymmetric remodeling, with intraventricular septal thickness  of 12 mm, posterior wall thickness of 7 mm, but myocardial mass  index of 86 g/m2.   Severe left ventricular systolic dysfunction (LVEF =16%). Global  hypokinesis worse in the basal septum and inferior and worse in the  apex (all segments). GLS -4%. There is no evidence of LV thrombus.   Left ventricular parametric mapping notable for increased T2 Stir  signal basal inferior, mid inferior and inferolateral,  anterolateral, and anterior, and apical (all segments). Highest T2  signal 70 ms.   Increase in native T1 signal in the inferior wall, highest inferior  apex 1200 Ms.   There is late gadolinium  enhancement in the left ventricular  myocardium: There is a apical-mid, inferior, mid wall LGE stripe.   2.  Mild increase in right ventricular size with RVEDVI 97 mL/m2.   Normal right ventricular thickness.   Moderately reduced right ventricular systolic function (RVEF =33%).  Septal hypokinesis.   3.  Normal left and right atrial size.   4. Normal size of the aortic root, ascending aorta and pulmonary  artery.   5. Valve assessment:   Aortic Valve:Tri-leaflet aortic valve. Qualitatively no significant  regurgitation.   Pulmonic Valve: Qualitatively no significant regurgitation.   Tricuspid Valve: Qualitatively no significant regurgitation.   Mitral Valve: Mild central regurgitation, regurgitant fraction 16%.   6. Normal pericardium. No pericardial thickening. No pericardial  effusion. There is slight pericardial enhancement over the inferior  RV.   7. Grossly, no extracardiac findings. Recommended dedicated study if concerned for non-cardiac pathology.   8. Wrap around and breath hold artifacts noted. This decreased the  sensitivity of volumetric assessments.   IMPRESSION:  2018 [facility] Criteria for Myocarditis met. Suggestive of myopericarditis with severely reduced LVEF.    Right Heart Cath and Coronary Angiogram  (OSH) 03/20/2021 Findings:   RA = 3  RV = 32/10  PA = 34/20 (26)  PCW = 17  Fick cardiac output/index = 6.1/2.5  PVR = 1.5 WU  Ao sat = 94%  PA sat = 68%, 68%  Assessment:  1. NICM  with EF 25%. Suspect myocarditis  2. Well-compensated filling pressures  Coronary Findings Diagnostic Dominance: Right  Left Main: Vessel is angiographically normal.  Left Anterior Descending: Vessel is angiographically normal.  Left Circumflex: Vessel is angiographically normal.  Right Coronary Artery: Vessel is angiographically normal.  Intervention  No  interventions have been documented.  Left Ventricle  Calculated EF is 25%.   Assessment &  Recommendations  Mr. Michalowski is a 38 y.o. 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 Dionisio and Dr. Leanor. He presented on 10/15 for an outpatient RHC that showed severely elevated filling pressures and severely reduced CI (1.9 with PA sat 28%). He does have evidence of pneumonia and has been started on abx (WBC improving). He has diuresed nicely with a Lasix  gtt and Cr has improved.  We discussed his case at listing conference on 10/15. CSW following along and the patient has been posted for LVAD with Dr. Leanor on 10/22. Barriers and considerations for OHT include size, BMI, blood type (O), former smoking/substance use.  Recommendations: - PA catheter to guide diuresis once blood cultures negative at 24hrs  - Continue Lasix  gtt for aggressive diuresis - Continue current support with IABP and milrinone  0.125 mcg/kg/min  This case was discussed with the VAD attending, Dr. Quintin. Thank you for asking us  to participate in the care of this patient. Please page the VAD consult pager [phone] with questions.  Jerrell Orchard, MD Cardiology Fellow PGY5  ------------------------------------------------------------------------------- Attestation signed by Quintin Fairy Serve, MD at 10/28/2023  5:25 PM Attestation Statement:   I personally saw and evaluated the patient, and participated in the management and treatment plan as documented in the resident/fellow note.  Looks better on exam today, warmer and WOB improved  after interventions as above.  Given BCx ng at 24h and wbc downtrending without fever, SWANN being placed now. We have discussed how his initial RV hemodynamics including low PAPI, and severe RV dysfunction by TTE, make him high risk for perioperative RV failure. As we continue to optimize can use swan to determine if peri-operative RVAD might be useful.     Fairy Quintin, MD Advanced Heart Failure Team [facility]  -------------------------------------------------------------------------------

## 2023-11-17 ENCOUNTER — Telehealth (HOSPITAL_COMMUNITY): Payer: Self-pay

## 2023-11-17 ENCOUNTER — Telehealth: Payer: Self-pay | Admitting: *Deleted

## 2023-11-17 ENCOUNTER — Other Ambulatory Visit (HOSPITAL_COMMUNITY): Payer: Self-pay

## 2023-11-17 NOTE — Transitions of Care (Post Inpatient/ED Visit) (Signed)
   11/17/2023  Name: GLORIA LAMBERTSON MRN: [id number] DOB: 15-May-1985  Today's TOC FU Call Status: Today's TOC FU Call Status:: Unsuccessful Call (1st Attempt) Unsuccessful Call (1st Attempt) Date: 11/17/23  Attempted to reach the patient regarding the most recent Inpatient/ED visit.  Follow Up Plan: Additional outreach attempts will be made to reach the patient to complete the Transitions of Care (Post Inpatient/ED visit) call.   Andrea Dimes RN, BSN   [facility] RN Care Manager [phone]

## 2023-11-17 NOTE — Telephone Encounter (Signed)
 Advanced Heart Failure Patient Advocate Encounter  Received notification from [facility] that Jardiance  assistance will expire. Test billing returns $4 copay for 90 day supply, medication assistance not needed at this time.   Rachel DEL, CPhT Rx Patient Advocate Phone: [phone]

## 2023-11-19 ENCOUNTER — Telehealth: Payer: Self-pay | Admitting: *Deleted

## 2023-11-19 NOTE — Transitions of Care (Post Inpatient/ED Visit) (Signed)
   11/19/2023  Name: Robert Daniels MRN: [id number] DOB: 12/24/1985  Today's TOC FU Call Status: Today's TOC FU Call Status:: Unsuccessful Call (2nd Attempt) Unsuccessful Call (2nd Attempt) Date: 11/19/23  Attempted to reach the patient regarding the most recent Inpatient/ED visit.  Follow Up Plan: Additional outreach attempts will be made to reach the patient to complete the Transitions of Care (Post Inpatient/ED visit) call.   Andrea Dimes RN, BSN [place]  [facility] RN Care Manager [phone]

## 2023-11-23 ENCOUNTER — Telehealth: Payer: Self-pay | Admitting: *Deleted

## 2023-11-23 NOTE — Transitions of Care (Post Inpatient/ED Visit) (Signed)
   11/23/2023  Name: Robert Daniels MRN: [id number] DOB: March 29, 1985  Today's TOC FU Call Status: Today's TOC FU Call Status:: Unsuccessful Call (3rd Attempt) Unsuccessful Call (3rd Attempt) Date: 11/23/23  Attempted to reach the patient regarding the most recent Inpatient/ED visit.  Follow Up Plan: No further outreach attempts will be made at this time. We have been unable to contact the patient.  Andrea Dimes RN, BSN [place]  [facility] RN Care Manager [phone]
